# Patient Record
Sex: Male | Born: 1950 | Race: White | Hispanic: No | Smoking: Former smoker
Health system: Southern US, Community
[De-identification: ages and names within clinical notes are randomized; demographics above are authoritative.]

## PROBLEM LIST (undated history)

## (undated) DIAGNOSIS — I10 Essential (primary) hypertension: Secondary | ICD-10-CM

## (undated) DIAGNOSIS — C801 Malignant (primary) neoplasm, unspecified: Secondary | ICD-10-CM

## (undated) DIAGNOSIS — G8929 Other chronic pain: Secondary | ICD-10-CM

## (undated) DIAGNOSIS — J449 Chronic obstructive pulmonary disease, unspecified: Secondary | ICD-10-CM

## (undated) DIAGNOSIS — M1990 Unspecified osteoarthritis, unspecified site: Secondary | ICD-10-CM

## (undated) DIAGNOSIS — F419 Anxiety disorder, unspecified: Secondary | ICD-10-CM

## (undated) DIAGNOSIS — E119 Type 2 diabetes mellitus without complications: Secondary | ICD-10-CM

## (undated) DIAGNOSIS — M549 Dorsalgia, unspecified: Secondary | ICD-10-CM

## (undated) HISTORY — DX: Chronic obstructive pulmonary disease, unspecified: J44.9

## (undated) HISTORY — DX: Malignant (primary) neoplasm, unspecified: C80.1

## (undated) HISTORY — PX: NASAL SEPTUM SURGERY: SHX37

## (undated) HISTORY — PX: COLONOSCOPY W/ POLYPECTOMY: SHX1380

## (undated) HISTORY — DX: Type 2 diabetes mellitus without complications: E11.9

## (undated) HISTORY — PX: LEG SURGERY: SHX1003

## (undated) HISTORY — DX: Essential (primary) hypertension: I10

## (undated) HISTORY — DX: Anxiety disorder, unspecified: F41.9

---

## 2000-10-11 ENCOUNTER — Ambulatory Visit (HOSPITAL_BASED_OUTPATIENT_CLINIC_OR_DEPARTMENT_OTHER): Admission: RE | Admit: 2000-10-11 | Discharge: 2000-10-12 | Payer: Self-pay | Admitting: Orthopedic Surgery

## 2004-01-13 ENCOUNTER — Ambulatory Visit: Payer: Self-pay | Admitting: Internal Medicine

## 2004-03-14 ENCOUNTER — Ambulatory Visit: Payer: Self-pay | Admitting: Internal Medicine

## 2004-03-21 ENCOUNTER — Ambulatory Visit: Payer: Self-pay | Admitting: Internal Medicine

## 2004-03-22 ENCOUNTER — Ambulatory Visit: Payer: Self-pay | Admitting: Internal Medicine

## 2004-04-22 ENCOUNTER — Ambulatory Visit: Payer: Self-pay | Admitting: Internal Medicine

## 2004-06-24 ENCOUNTER — Ambulatory Visit: Payer: Self-pay | Admitting: Internal Medicine

## 2004-08-24 ENCOUNTER — Ambulatory Visit: Payer: Self-pay | Admitting: Internal Medicine

## 2004-10-24 ENCOUNTER — Ambulatory Visit: Payer: Self-pay | Admitting: Internal Medicine

## 2004-10-25 ENCOUNTER — Ambulatory Visit: Payer: Self-pay | Admitting: Internal Medicine

## 2004-12-12 ENCOUNTER — Ambulatory Visit: Payer: Self-pay | Admitting: Internal Medicine

## 2005-02-27 ENCOUNTER — Ambulatory Visit: Payer: Self-pay | Admitting: Internal Medicine

## 2005-04-17 ENCOUNTER — Ambulatory Visit: Payer: Self-pay | Admitting: Internal Medicine

## 2005-06-16 ENCOUNTER — Ambulatory Visit: Payer: Self-pay | Admitting: Internal Medicine

## 2005-08-08 ENCOUNTER — Ambulatory Visit: Payer: Self-pay | Admitting: Internal Medicine

## 2005-08-09 ENCOUNTER — Ambulatory Visit: Payer: Self-pay | Admitting: Internal Medicine

## 2005-08-11 ENCOUNTER — Ambulatory Visit: Payer: Self-pay | Admitting: Internal Medicine

## 2005-10-17 ENCOUNTER — Ambulatory Visit: Payer: Self-pay | Admitting: Internal Medicine

## 2005-11-30 ENCOUNTER — Ambulatory Visit: Payer: Self-pay | Admitting: Internal Medicine

## 2005-12-07 ENCOUNTER — Ambulatory Visit: Payer: Self-pay

## 2005-12-20 ENCOUNTER — Ambulatory Visit: Payer: Self-pay | Admitting: Internal Medicine

## 2005-12-26 ENCOUNTER — Ambulatory Visit: Payer: Self-pay | Admitting: Internal Medicine

## 2006-01-19 ENCOUNTER — Ambulatory Visit: Payer: Self-pay | Admitting: Internal Medicine

## 2006-03-23 ENCOUNTER — Ambulatory Visit: Payer: Self-pay | Admitting: Internal Medicine

## 2006-06-27 ENCOUNTER — Ambulatory Visit: Payer: Self-pay | Admitting: Internal Medicine

## 2006-09-20 ENCOUNTER — Encounter: Payer: Self-pay | Admitting: Internal Medicine

## 2006-09-20 DIAGNOSIS — Z860100 Personal history of colon polyps, unspecified: Secondary | ICD-10-CM | POA: Insufficient documentation

## 2006-09-20 DIAGNOSIS — Z860101 Personal history of adenomatous and serrated colon polyps: Secondary | ICD-10-CM | POA: Insufficient documentation

## 2006-09-20 DIAGNOSIS — J309 Allergic rhinitis, unspecified: Secondary | ICD-10-CM | POA: Insufficient documentation

## 2006-09-20 DIAGNOSIS — M25579 Pain in unspecified ankle and joints of unspecified foot: Secondary | ICD-10-CM

## 2006-09-20 DIAGNOSIS — F17200 Nicotine dependence, unspecified, uncomplicated: Secondary | ICD-10-CM | POA: Insufficient documentation

## 2006-09-20 DIAGNOSIS — F411 Generalized anxiety disorder: Secondary | ICD-10-CM

## 2006-09-20 DIAGNOSIS — J449 Chronic obstructive pulmonary disease, unspecified: Secondary | ICD-10-CM

## 2006-09-20 DIAGNOSIS — N410 Acute prostatitis: Secondary | ICD-10-CM

## 2006-12-26 ENCOUNTER — Ambulatory Visit: Payer: Self-pay | Admitting: Internal Medicine

## 2007-02-07 ENCOUNTER — Telehealth: Payer: Self-pay | Admitting: Internal Medicine

## 2007-03-11 ENCOUNTER — Ambulatory Visit: Payer: Self-pay | Admitting: Internal Medicine

## 2007-03-11 DIAGNOSIS — F524 Premature ejaculation: Secondary | ICD-10-CM

## 2007-03-11 DIAGNOSIS — N529 Male erectile dysfunction, unspecified: Secondary | ICD-10-CM

## 2007-03-18 ENCOUNTER — Telehealth: Payer: Self-pay | Admitting: Internal Medicine

## 2007-05-17 ENCOUNTER — Encounter: Payer: Self-pay | Admitting: Internal Medicine

## 2007-06-13 ENCOUNTER — Ambulatory Visit: Payer: Self-pay | Admitting: Internal Medicine

## 2007-09-13 ENCOUNTER — Ambulatory Visit: Payer: Self-pay | Admitting: Internal Medicine

## 2010-06-17 NOTE — Op Note (Signed)
[place]  Patient:    Joseph Preston, Joseph Preston Visit Number: [id number] MRN: [id number]          Service Type: Attending:  Loreta Ave, M.D. Dictated by:   Loreta Ave, M.D. Proc. Date: 10/11/00                             Operative Report  PREOPERATIVE DIAGNOSIS:  Bimalleolar ankle fracture, right, status post reduction dislocation.  POSTOPERATIVE DIAGNOSIS:  Bimalleolar ankle fracture, right, status post reduction dislocation.  OPERATIVE PROCEDURE:  Open reduction internal fixation, bimalleolar ankle fracture, right, utilizing DePuy titanium screws and plates.  SURGEON:  Loreta Ave, M.D.  ASSISTANT:  Arlys John D. Petrarca, P.A.-C.  ANESTHESIA:  General.  ESTIMATED BLOOD LOSS:  Minimal.  TOURNIQUET TIME:  One hour.  SPECIMENS:  None.  CULTURES:  None.  COMPLICATIONS:  None.  DRESSING:  Soft bulky dressing with splint.  DESCRIPTION OF PROCEDURE:  The patient was brought to the operating room and place on the operating table in the supine position.  After adequate anesthesia had been obtained, a tourniquet was applied to the upper aspect of the right leg.  The splint was removed.  Prepped and draped in the usual sterile fashion.  Exsanguinated with elevation and Esmarch tourniquet inflated to 350 mmHg.  A small incision over the medial malleolus. The skin and subcutaneous tissue divided.  A transverse fracture below the plafond.  The fracture site cleared.  Reduced anatomically. Had been placed with a clamp.  Two guidewires were passed across the fracture and found to be in acceptable position with fluoroscopy.  Predrilled and then affixed with two 40-mm partially threaded lag screws which were firmly placed yielding firm anatomic alignment and fixation.  The guidewires were removed.  Lateral malleolus approached with a straight incision over the back of the fibula. Skin and subcutaneous tissue divided.  Fracture exposed.   Debris and hematoma cleared.  Reduced anatomically.  A six-hole pre-bent titanium plate was placed on the posterolateral aspect of the fibula and fixed with three screws above and three below the fracture site with the middle most screw placed in a lag manner across the fracture.  Solid stable fixation and alignment at completion.  Care taken not to enter not to enter the ankle.  Full motion with good stability at completion visually and with fluoroscopy.  Wounds irrigated and closed with Vicryl and staples.  Margin wound injected with Marcaine. Sterile compressive dressing applied.  Splint applied.  Tourniquet and plate were removed.  Anesthesia reversed.  Brought to recovery room.  Tolerated surgery well, no complications. Dictated by:   Loreta Ave, M.D. Attending:  Loreta Ave, M.D. DD:  10/11/00 TD:  10/11/00 Job: [id number]

## 2010-06-17 NOTE — Assessment & Plan Note (Signed)
[facility]                           GASTROENTEROLOGY OFFICE NOTE   NAME:Joseph Preston, Joseph Preston                    MRN:          [id number]  DATE:12/20/2005                            DOB:          April 01, 1950    REFERRING PHYSICIAN:  Georgina Quint. Plotnikov, MD   REASON FOR CONSULTATION:  Dysphagia.   ASSESSMENT:  1. Dysphagia symptoms, intermittent, in a 60 year old man with reflux      problems on b.i.d. proton pump inhibitor.  Currently without dysphagia,      though had a spell one or two weeks ago that was fairly severe with      pressure in his esophagus after he swallowed.  2. History of colon polyps.  Surveillance colonoscopy due January 2008.  3. Chest pain with negative stress Myoview recently.   PLAN:  1. Schedule upper GI endoscopy, possible esophageal dilation.  2. He has asked for a cheaper PPI, so we will try omeprazole 20 mg b.i.d.      to see if he can get that and it works as well.  He may not need a      chronic b.i.d. PPI.  He has been on that for about four months because      of the flare of reflux symptoms over the summer.  3. Schedule surveillance colonoscopy.  It is a little early, but that is      reasonable.   NOTE:  He had H. pylori treated (positive serology) in the summer.   HISTORY:  The patient is a 60 year old man I know from previous colonoscopy  in 2004, showing diverticulosis, hemorrhoids and an adenomatous cecal polyp.  He has had spells of dysphagia, off and on, eating and feeling pressure in  his esophagus and chest area and taking liquids to make it clear.  This is  intermittent.  He had a bad spell a week or two ago.  This is better now.  He had increase in reflux symptoms in the summer with heartburn and went on  b.i.d. Protonix with some help.  He had a lot of fatigue and questionable  viral syndrome or something like that for the first part of 2007 and is  finally starting to feel better.  He has  complained of some dark stools, as  well.  I do not think he really had melena, but he might have.   PAST MEDICAL HISTORY:  1. Chronic low back pain.  2. Fatigue issues, resolving.  3. Allergic rhinitis.  4. Chronic obstructive pulmonary disease.  5. Smoker.  6. Prostatitis.  7. Colon polyps.  8. Irritable bowel syndrome.  9. Anxiety.  10.Leg fracture.  11.Sinus surgery.  12.Depression.  13.Panic disorder.  14.Osteoarthritis.   FAMILY HISTORY:  Father had prostate cancer.  Grandparents were alcoholics.  His brother had colon polyps, as well.   SOCIAL HISTORY:  Separated.  Three sons.  He is a Research scientist (physical sciences).  He is living with his mother, who is dying with colon cancer.  He still  smokes, occasional marijuana, occasional alcohol.   REVIEW OF SYSTEMS:  See medical history form for  full details.  This is  noted and reviewed there.   PHYSICAL EXAMINATION:  GENERAL:  Well-developed, middle-aged white man,  appearing somewhat older than stated age.  VITAL SIGNS:  Height 5 feet 8, weight 158 pounds.  HEENT:  Eyes anicteric.  Mouth:  Posterior pharynx free of lesions.  NECK:  The neck is supple, without thyromegaly or masses.  CHEST:  The chest is clear.  HEART:  S1, S2, no murmurs or gallops.  ABDOMEN:  Soft, nontender, no organomegaly or masses.  RECTAL EXAM:  Deferred.  LYMPHATICS:  No neck or supraclavicular nodes.  EXTREMITIES:  No peripheral edema in lower extremities.  SKIN:  Sun damaged.  NEUROLOGIC/PSYCHIATRIC:  He is alert and oriented times three.   I appreciate the opportunity to care for this patient.     Iva Boop, MD,FACG  Electronically Signed    CEG/MedQ  DD: 12/20/2005  DT: 12/20/2005  Job #: [id number]   cc:   Georgina Quint. Plotnikov, MD

## 2011-02-01 ENCOUNTER — Encounter: Payer: Self-pay | Admitting: Internal Medicine

## 2011-10-25 ENCOUNTER — Encounter: Payer: Self-pay | Admitting: Internal Medicine

## 2011-11-21 ENCOUNTER — Encounter (HOSPITAL_COMMUNITY): Payer: Self-pay

## 2011-11-21 ENCOUNTER — Emergency Department (HOSPITAL_COMMUNITY)
Admission: EM | Admit: 2011-11-21 | Discharge: 2011-11-21 | Disposition: A | Discharge status: Home / Self Care | Payer: Self-pay | Attending: Emergency Medicine | Admitting: Emergency Medicine

## 2011-11-21 DIAGNOSIS — Y929 Unspecified place or not applicable: Secondary | ICD-10-CM | POA: Insufficient documentation

## 2011-11-21 DIAGNOSIS — F17200 Nicotine dependence, unspecified, uncomplicated: Secondary | ICD-10-CM | POA: Insufficient documentation

## 2011-11-21 DIAGNOSIS — T1590XA Foreign body on external eye, part unspecified, unspecified eye, initial encounter: Secondary | ICD-10-CM | POA: Insufficient documentation

## 2011-11-21 DIAGNOSIS — Y9389 Activity, other specified: Secondary | ICD-10-CM | POA: Insufficient documentation

## 2011-11-21 DIAGNOSIS — S0502XA Injury of conjunctiva and corneal abrasion without foreign body, left eye, initial encounter: Secondary | ICD-10-CM

## 2011-11-21 DIAGNOSIS — Z79899 Other long term (current) drug therapy: Secondary | ICD-10-CM | POA: Insufficient documentation

## 2011-11-21 DIAGNOSIS — S058X9A Other injuries of unspecified eye and orbit, initial encounter: Secondary | ICD-10-CM | POA: Insufficient documentation

## 2011-11-21 MED ORDER — TETRACAINE HCL 0.5 % OP SOLN
2.0000 [drp] | Freq: Once | OPHTHALMIC | Status: AC
  Administered 2011-11-21: 2 [drp] via OPHTHALMIC
  Filled 2011-11-21: qty 2

## 2011-11-21 MED ORDER — KETOROLAC TROMETHAMINE 0.5 % OP SOLN
1.0000 [drp] | Freq: Once | OPHTHALMIC | Status: AC
  Administered 2011-11-21: 1 [drp] via OPHTHALMIC
  Filled 2011-11-21: qty 5

## 2011-11-21 MED ORDER — TOBRAMYCIN 0.3 % OP SOLN
2.0000 [drp] | Freq: Once | OPHTHALMIC | Status: AC
  Administered 2011-11-21: 2 [drp] via OPHTHALMIC
  Filled 2011-11-21: qty 5

## 2011-11-21 MED ORDER — HYDROCODONE-ACETAMINOPHEN 5-325 MG PO TABS
1.0000 | ORAL_TABLET | Freq: Once | ORAL | Status: AC
  Administered 2011-11-21: 1 via ORAL
  Filled 2011-11-21: qty 1

## 2011-11-21 MED ORDER — HYDROCODONE-ACETAMINOPHEN 5-325 MG PO TABS: ORAL_TABLET | ORAL | Status: DC

## 2011-11-21 NOTE — ED Provider Notes (Signed)
History     CSN: [id number]  Arrival date & time 11/21/11  1705   First MD Initiated Contact with Patient 11/21/11 1737      Chief Complaint  Patient presents with  . Foreign Body in Eye    (Consider location/radiation/quality/duration/timing/severity/associated sxs/prior treatment) HPI Comments: Patient c/o pain to the left eye that occurred several hours PTA when he was sawing wood and accidentally got saw dust in his left eye.  States that he has rinsed his eye with tap water and used Visine drops w/o relief.  He also report excessive tearing and sensitivity to bright lights.  He denies vomiting, headache, neck pain or decreased vision.    Patient is a 61 y.o. male presenting with eye pain. The history is provided by the patient.  Eye Pain This is a new problem. Episode onset: several hrs prior to ED arrival. The problem occurs constantly. The problem has been unchanged. Associated symptoms include a visual change. Pertinent negatives include no chest pain, fever, headaches, nausea, neck pain, numbness, rash, sore throat, swollen glands, vertigo, vomiting or weakness. Exacerbated by: bright lights and blinking. Treatments tried: rinsing. The treatment provided no relief.    History reviewed. No pertinent past medical history.  Past Surgical History  Procedure Date  . Colonoscopy w/ polypectomy     No family history on file.  History  Substance Use Topics  . Smoking status: Current Every Day Smoker  . Smokeless tobacco: Not on file  . Alcohol Use: No      Review of Systems  Constitutional: Negative for fever, activity change and appetite change.  HENT: Negative for sore throat, facial swelling and neck pain.   Eyes: Positive for photophobia, pain and redness. Negative for discharge and itching.  Respiratory: Negative for chest tightness and shortness of breath.   Cardiovascular: Negative for chest pain.  Gastrointestinal: Negative for nausea and vomiting.  Skin:  Negative for rash.  Neurological: Negative for dizziness, vertigo, syncope, facial asymmetry, weakness, numbness and headaches.  Psychiatric/Behavioral: Negative for confusion.  All other systems reviewed and are negative.    Allergies  Penicillins; Sulfonamide derivatives; and Varenicline tartrate  Home Medications   Current Outpatient Rx  Name Route Sig Dispense Refill  . NAPROXEN SODIUM 220 MG PO TABS Oral Take 220 mg by mouth daily as needed. For arthritis pain    . POLYVINYL ALCOHOL-POVIDONE 5-6 MG/ML OP SOLN Ophthalmic Apply 1 drop to eye as needed. To flush eye of debri      BP 149/85  Pulse 74  Temp 98.4 F (36.9 C) (Oral)  Resp 20  Ht 5\' 7"  (1.702 m)  Wt 150 lb (68.04 kg)  BMI 23.49 kg/m2  SpO2 100%  Physical Exam  Nursing note and vitals reviewed. Constitutional: He is oriented to person, place, and time. He appears well-developed and well-nourished. No distress.  HENT:  Head: Normocephalic and atraumatic.  Mouth/Throat: Oropharynx is clear and moist.  Eyes: Pupils are equal, round, and reactive to light. No foreign bodies found. Left eye exhibits no chemosis, no discharge, no exudate and no hordeolum. No foreign body present in the left eye. Left conjunctiva is injected. Left eye exhibits normal extraocular motion and no nystagmus.  Slit lamp exam:      The left eye shows corneal abrasion. The left eye shows no corneal flare, no corneal ulcer, no foreign body, no hyphema, no hypopyon and no fluorescein uptake.    Neck: Normal range of motion. Neck supple.  Cardiovascular: Normal rate,  regular rhythm, normal heart sounds and intact distal pulses.   No murmur heard. Pulmonary/Chest: Effort normal and breath sounds normal.  Musculoskeletal: Normal range of motion. He exhibits no edema and no tenderness.  Lymphadenopathy:    He has no cervical adenopathy.  Neurological: He is alert and oriented to person, place, and time. He exhibits normal muscle tone.  Coordination normal.  Skin: Skin is warm and dry.    ED Course  Procedures (including critical care time)  Labs Reviewed - No data to display No results found.   Visual acuity reviewed by me.     MDM    Patient is feeling better,  Large corneal abrasion to the left eye.  No FB's seen. Left eye was also irrigated with nml saline.  Pt agrees to close f/u with ophthalmologist in 1-2 days for recheck.    Tobramycin and ketorolac opth drops dispensed from the ED.  Prescribed: norco #15     Lynford Espinoza L. Joandy Burget, [place] 11/23/11 0131

## 2011-11-21 NOTE — ED Notes (Signed)
Left eye irrigated with saline

## 2011-11-21 NOTE — ED Notes (Signed)
Left eye red and painful

## 2011-11-21 NOTE — ED Notes (Signed)
Pt reports got saw dust in left eye.  Pt says has flushed eye several times but no relief.

## 2011-11-24 NOTE — ED Provider Notes (Signed)
Medical screening examination/treatment/procedure(s) were performed by non-physician practitioner and as supervising physician I was immediately available for consultation/collaboration.   Azreal Stthomas L Corin Tilly, MD 11/24/11 1447 

## 2013-01-07 ENCOUNTER — Emergency Department (HOSPITAL_COMMUNITY)
Admission: EM | Admit: 2013-01-07 | Discharge: 2013-01-07 | Disposition: A | Discharge status: Home / Self Care | Payer: 59 | Attending: Emergency Medicine | Admitting: Emergency Medicine

## 2013-01-07 ENCOUNTER — Encounter (HOSPITAL_COMMUNITY): Payer: Self-pay | Admitting: Emergency Medicine

## 2013-01-07 DIAGNOSIS — Z87891 Personal history of nicotine dependence: Secondary | ICD-10-CM | POA: Insufficient documentation

## 2013-01-07 DIAGNOSIS — M129 Arthropathy, unspecified: Secondary | ICD-10-CM | POA: Insufficient documentation

## 2013-01-07 DIAGNOSIS — Z791 Long term (current) use of non-steroidal anti-inflammatories (NSAID): Secondary | ICD-10-CM | POA: Insufficient documentation

## 2013-01-07 DIAGNOSIS — M5430 Sciatica, unspecified side: Secondary | ICD-10-CM | POA: Insufficient documentation

## 2013-01-07 DIAGNOSIS — M5432 Sciatica, left side: Secondary | ICD-10-CM

## 2013-01-07 DIAGNOSIS — G8929 Other chronic pain: Secondary | ICD-10-CM | POA: Insufficient documentation

## 2013-01-07 DIAGNOSIS — Z880 Allergy status to penicillin: Secondary | ICD-10-CM | POA: Insufficient documentation

## 2013-01-07 HISTORY — DX: Dorsalgia, unspecified: M54.9

## 2013-01-07 HISTORY — DX: Unspecified osteoarthritis, unspecified site: M19.90

## 2013-01-07 HISTORY — DX: Other chronic pain: G89.29

## 2013-01-07 MED ORDER — DICLOFENAC SODIUM 75 MG PO TBEC: 75.0000 mg | DELAYED_RELEASE_TABLET | Freq: Two times a day (BID) | ORAL | Status: DC

## 2013-01-07 MED ORDER — METHOCARBAMOL 500 MG PO TABS: 500.0000 mg | ORAL_TABLET | Freq: Three times a day (TID) | ORAL | Status: DC

## 2013-01-07 MED ORDER — KETOROLAC TROMETHAMINE 60 MG/2ML IM SOLN
60.0000 mg | Freq: Once | INTRAMUSCULAR | Status: AC
  Administered 2013-01-07: 60 mg via INTRAMUSCULAR
  Filled 2013-01-07: qty 2

## 2013-01-07 MED ORDER — DEXAMETHASONE 6 MG PO TABS: ORAL_TABLET | ORAL | Status: DC

## 2013-01-07 MED ORDER — DEXAMETHASONE SODIUM PHOSPHATE 4 MG/ML IJ SOLN
8.0000 mg | Freq: Once | INTRAMUSCULAR | Status: AC
  Administered 2013-01-07: 8 mg via INTRAMUSCULAR
  Filled 2013-01-07: qty 2

## 2013-01-07 NOTE — ED Provider Notes (Signed)
History/physical exam/procedure(s) were performed by non-physician practitioner and as supervising physician I was immediately available for consultation/collaboration. I have reviewed all notes and am in agreement with care and plan.   Elnathan Fulford S Jaysiah Marchetta, MD 01/07/13 1643 

## 2013-01-07 NOTE — ED Notes (Signed)
Pt c/o lower back pain that radiates down left leg. States that he has had problems with his back and this pain for "years" but pain became worse over the past week, denies any injury.

## 2013-01-07 NOTE — ED Provider Notes (Signed)
CSN: [id number]     Arrival date & time 01/07/13  4098 History   First MD Initiated Contact with Patient 01/07/13 1021     Chief Complaint  Patient presents with  . Back Pain   (Consider location/radiation/quality/duration/timing/severity/associated sxs/prior Treatment) Patient is a 62 y.o. male presenting with back pain. The history is provided by the patient.  Back Pain Location:  Lumbar spine Quality:  Aching Radiates to:  L thigh Pain severity:  Severe Pain is:  Same all the time Onset quality:  Gradual Duration:  13 months Timing:  Intermittent Progression:  Worsening Chronicity: acute on chronic. Context comment:  Hx of sciatica, pain worse with standing and walking Relieved by:  NSAIDs Worsened by:  Movement and standing Associated symptoms: no abdominal pain, no bladder incontinence, no bowel incontinence, no chest pain, no dysuria and no perianal numbness     Past Medical History  Diagnosis Date  . Back pain, chronic   . Arthritis    Past Surgical History  Procedure Laterality Date  . Colonoscopy w/ polypectomy    . Leg surgery     No family history on file. History  Substance Use Topics  . Smoking status: Former Games developer  . Smokeless tobacco: Not on file  . Alcohol Use: No    Review of Systems  Constitutional: Negative for activity change.       All ROS Neg except as noted in HPI  HENT: Negative for nosebleeds.   Eyes: Negative for photophobia and discharge.  Respiratory: Negative for cough, shortness of breath and wheezing.   Cardiovascular: Negative for chest pain and palpitations.  Gastrointestinal: Negative for abdominal pain, blood in stool and bowel incontinence.  Genitourinary: Negative for bladder incontinence, dysuria, frequency and hematuria.  Musculoskeletal: Positive for back pain. Negative for arthralgias and neck pain.  Skin: Negative.   Neurological: Negative for dizziness, seizures and speech difficulty.  Psychiatric/Behavioral: Negative  for hallucinations and confusion.    Allergies  Penicillins; Sulfonamide derivatives; and Varenicline tartrate  Home Medications   Current Outpatient Rx  Name  Route  Sig  Dispense  Refill  . naproxen sodium (ALEVE) 220 MG tablet   Oral   Take 220 mg by mouth daily.           BP 136/80  Pulse 72  Temp(Src) 97.4 F (36.3 C) (Oral)  Resp 18  Ht 5\' 7"  (1.702 m)  Wt 158 lb (71.668 kg)  BMI 24.74 kg/m2  SpO2 100% Physical Exam  Nursing note and vitals reviewed. Constitutional: He is oriented to person, place, and time. He appears well-developed and well-nourished.  Non-toxic appearance.  HENT:  Head: Normocephalic.  Right Ear: Tympanic membrane and external ear normal.  Left Ear: Tympanic membrane and external ear normal.  Eyes: EOM and lids are normal. Pupils are equal, round, and reactive to light.  Neck: Normal range of motion. Neck supple. Carotid bruit is not present.  Cardiovascular: Normal rate, regular rhythm, normal heart sounds, intact distal pulses and normal pulses.   Pulmonary/Chest: Breath sounds normal. No respiratory distress.  Abdominal: Soft. Bowel sounds are normal. There is no tenderness. There is no guarding.  Musculoskeletal:       Lumbar back: He exhibits decreased range of motion, tenderness, pain and spasm.  Lymphadenopathy:       Head (right side): No submandibular adenopathy present.       Head (left side): No submandibular adenopathy present.    He has no cervical adenopathy.  Neurological: He is alert  and oriented to person, place, and time. He has normal strength. No cranial nerve deficit or sensory deficit.  Skin: Skin is warm and dry.  Psychiatric: He has a normal mood and affect. His speech is normal.    ED Course  Procedures (including critical care time) Labs Review Labs Reviewed - No data to display Imaging Review No results found.  EKG Interpretation   None       MDM  No diagnosis found. **I have reviewed nursing notes,  vital signs, and all appropriate lab and imaging results for this patient.*  Pt has hx of sciatica, but has not seen a physician in 8 years. He is in ED today because the pain is bringing him to tears at times.  No gross neuro deficit noted.  Rx for robaxin, decadron, and diclofenac given to the patient. Pt to follow up with PCP for evaluation and possible MRI.  Kathie Dike, PA-C 01/07/13 1104

## 2013-04-17 ENCOUNTER — Encounter: Payer: Self-pay | Admitting: Family Medicine

## 2013-04-17 ENCOUNTER — Telehealth: Payer: Self-pay | Admitting: Family Medicine

## 2013-04-17 ENCOUNTER — Ambulatory Visit (INDEPENDENT_AMBULATORY_CARE_PROVIDER_SITE_OTHER)
Admission: RE | Admit: 2013-04-17 | Discharge: 2013-04-17 | Disposition: A | Discharge status: Home / Self Care | Payer: 59 | Source: Ambulatory Visit | Attending: Family Medicine | Admitting: Family Medicine

## 2013-04-17 ENCOUNTER — Ambulatory Visit (INDEPENDENT_AMBULATORY_CARE_PROVIDER_SITE_OTHER): Payer: 59 | Admitting: Family Medicine

## 2013-04-17 VITALS — BP 140/80 | HR 62 | Wt 172.0 lb

## 2013-04-17 DIAGNOSIS — M545 Low back pain, unspecified: Secondary | ICD-10-CM

## 2013-04-17 DIAGNOSIS — G5702 Lesion of sciatic nerve, left lower limb: Secondary | ICD-10-CM

## 2013-04-17 DIAGNOSIS — G5700 Lesion of sciatic nerve, unspecified lower limb: Secondary | ICD-10-CM

## 2013-04-17 MED ORDER — GABAPENTIN 300 MG PO CAPS: ORAL_CAPSULE | ORAL | Status: DC

## 2013-04-17 MED ORDER — PREDNISONE 50 MG PO TABS: 50.0000 mg | ORAL_TABLET | Freq: Every day | ORAL | Status: DC

## 2013-04-17 NOTE — Assessment & Plan Note (Signed)
Patient's low back pain is likely secondary to osteoarthritis. We will get x-rays to further evaluate. Differential also includes possibly piriformis syndrome I think that is giving him his symptoms. Patient's though if he does not make any improvement with conservative therapy we'll be fairly quickly to get an MRI. Patient does not have any significant neurological weakness stated today on physical exam but per patient's symptoms I am concerned. Patient and will come back again in 1-2 weeks. Patient in the interim we'll try prednisone as well as gabapentin at night to see if we can make any benefit. We will also treat tearfulness and patient's leg length discrepancy.

## 2013-04-17 NOTE — Assessment & Plan Note (Signed)
Piriformis Syndrome  Using an anatomical model, reviewed with the patient the structures involved and how they related to diagnosis. The patient indicated understanding.   The patient was given a handout from Dr. Arne Cleveland book "The Sports Medicine Patient Advisor" describing the anatomy and rehabilitation of the following condition: Piriformis Syndrome  Also given a handout with more extensive Piriformis stretching, hip flexor and abductor strengthening, ham stretching  Rec deep massage, explained self-massage with ball  Prednisone, gabapentin as well as the discussed tennis ball to back pocket Patient will come back again in one 2 weeks for further evaluation. We may need to consider MRI of the low back to rule out nerve impingement at this continues.

## 2013-04-17 NOTE — Progress Notes (Signed)
  [name] [address], [place] [address] Phone: [phone] Subjective:     CC: back and leg pain.   [id number] Joseph Preston is a 63 y.o. male coming in with complaint of back and leg pain. Patient states it usually goes down the left leg. Patient was seen in the emergency department greater than 3 months ago for similar symptoms. Patient states that this pain now seems to be mostly chronic going down the posterior aspect of his leg only to the dorsal aspect of his foot. Patient states that he is having some associated weakness of that as well. 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 Patient describes a chronic dull aching sensation in his back is most severe in his left buttocks. Patient states that this pain wakes him up at night and states that the severity is 9/10. Patient was given some corticosteroids by emergency department 3 months ago that did help relieve some of his symptoms. Patient has been trying to take anti-inflammatories with no significant improvement. Patient is also try a muscle relaxer that just barely takes the edge off. No exercises have been given to this individual. No imaging has been taken for greater than 6 years.     Past medical history, social, surgical and family history all reviewed in electronic medical record.   Review of Systems: No headache, visual changes, nausea, vomiting, diarrhea, constipation, dizziness, abdominal pain, skin rash, fevers, chills, night sweats, weight loss, swollen lymph nodes, body aches, joint swelling, muscle aches, chest pain, shortness of breath, mood changes.   Objective Blood pressure 140/80, pulse 62, weight 172 lb (78.019 kg), SpO2 98.00%.  General: No apparent distress alert and oriented x3 mood and affect normal, dressed appropriately.  HEENT: Pupils equal, extraocular movements  intact  Respiratory: Patient's speak in full sentences and does not appear short of breath  Cardiovascular: No lower extremity edema, non tender, no erythema  Skin: Warm dry intact with no signs of infection or rash on extremities or on axial skeleton.  Abdomen: Soft nontender  Neuro: Cranial nerves II through XII are intact, neurovascularly intact in all extremities with 2+ DTRs and 2+ pulses.  Lymph: No lymphadenopathy of posterior or anterior cervical chain or axillae bilaterally.   MSK:  Non tender with full range of motion and good stability and symmetric strength and tone of shoulders, elbows, wrist, hip, knee and ankles bilaterally.  Back Exam:  Inspection: Unremarkable poor course strength Motion: Flexion 45 deg, Extension 35 deg, Side Bending to 45 deg bilaterally,  Rotation to 45 deg bilaterally  SLR laying: Negative  XSLR laying: Negative  Palpable tenderness: Mild tenderness of the paraspinal musculature of the lumbar spine with severe tenderness in the piriformis muscle. FABER: Positive on left. Sensory change: Gross sensation intact to all lumbar and sacral dermatomes.  Reflexes: 2+ at both patellar tendons, 2+ at achilles tendons, Babinski's downgoing.  Strength at foot  Plantar-flexion: 5/5 Dorsi-flexion: 5/5 Eversion: 5/5 Inversion: 5/5  Leg strength  Quad: 5/5 Hamstring: 5/5 Hip flexor: 5/5 Hip abductors: 5/5  Gait antalgic Leg length discrepancy with patient's right leg being significantly shorter than left   Impression and Recommendations:     This case required medical decision making of moderate complexity.

## 2013-04-17 NOTE — Telephone Encounter (Signed)
Pt called to say he is in a lot of pain.  He is requesting pain medicine.  Pharmacy is [facility] in [place].

## 2013-04-17 NOTE — Patient Instructions (Addendum)
Very good to see you Try exercises most days of the week Prednisone daily for 5 days Vitamin D 2000 IU daily Turmeric 500mg  Twice daily.  Gabapentin at night.  Tennis ball back left pocket at all times.  Wear heel lift in RIGHT shoe.  Xrays downstairs today.  Come back or call me in 1 week, if still in pain we will get MRI

## 2013-04-18 NOTE — Telephone Encounter (Signed)
Pt is aware.  

## 2013-04-18 NOTE — Telephone Encounter (Signed)
LM to return call.

## 2013-04-18 NOTE — Telephone Encounter (Signed)
He needs to try the medications that I sent him.  We can discuss at follow up.

## 2013-05-09 ENCOUNTER — Ambulatory Visit (INDEPENDENT_AMBULATORY_CARE_PROVIDER_SITE_OTHER): Payer: 59 | Admitting: Family Medicine

## 2013-05-09 ENCOUNTER — Ambulatory Visit: Payer: 59 | Admitting: Physician Assistant

## 2013-05-09 ENCOUNTER — Encounter: Payer: Self-pay | Admitting: Family Medicine

## 2013-05-09 VITALS — BP 126/80 | HR 77 | Wt 167.0 lb

## 2013-05-09 DIAGNOSIS — M5416 Radiculopathy, lumbar region: Secondary | ICD-10-CM

## 2013-05-09 DIAGNOSIS — IMO0002 Reserved for concepts with insufficient information to code with codable children: Secondary | ICD-10-CM

## 2013-05-09 DIAGNOSIS — M545 Low back pain, unspecified: Secondary | ICD-10-CM

## 2013-05-09 MED ORDER — GABAPENTIN 100 MG PO CAPS: 100.0000 mg | ORAL_CAPSULE | Freq: Two times a day (BID) | ORAL | Status: DC

## 2013-05-09 MED ORDER — TRAMADOL HCL 50 MG PO TABS: 50.0000 mg | ORAL_TABLET | Freq: Three times a day (TID) | ORAL | Status: DC | PRN

## 2013-05-09 NOTE — Assessment & Plan Note (Signed)
Patient not responding well to conservative therapy and patient's x-rays maybe corresponding to an L5-S1 nerve root compression I do feel further imaging is necessary. MRI is ordered. Discuss in the interim patient increasing gabapentin 200 mg twice during the day and giving tramadol for breakthrough pain. Continued gabapentin at night. Patient will followup one to 2 days after the MRI and we'll go after the results.

## 2013-05-09 NOTE — Progress Notes (Signed)
  [name], [name] [address] Phone: [phone] Subjective:     CC: back and leg pain follow up  [id number] Joseph Preston is a 63 y.o. male coming in with complaint of back and leg pain. Patient's did have x-rays and was found to have L4-L5 as well as L5-S1 degenerative disc disease. Patient also has some mild grade 1 anterior listhesis. Patient was given prednisone as well as started on gabapentin and night for the radicular symptoms. Patient given home exercise program and discussed over-the-counter medications that could be beneficial. Patient was to return one week afterwards for further evaluation and this was one month ago. Patient states really has not any significantly better. Patient was given prednisone and states that for does 5 days he did have significant improvement with decreasing radicular symptoms down the left leg. Patient states that after taking the medicine that seemed to return. Patient states that the gabapentin does help him sleep at night which is helpful. Patient denies any new symptoms and denies any weakness. Patient states he can have good days and bad days.     Past medical history, social, surgical and family history all reviewed in electronic medical record.   Review of Systems: No headache, visual changes, nausea, vomiting, diarrhea, constipation, dizziness, abdominal pain, skin rash, fevers, chills, night sweats, weight loss, swollen lymph nodes, body aches, joint swelling, muscle aches, chest pain, shortness of breath, mood changes.   Objective Blood pressure 126/80, pulse 77, weight 167 lb (75.751 kg), SpO2 96.00%.  General: No apparent distress alert and oriented x3 mood and affect normal, dressed appropriately.  HEENT: Pupils equal, extraocular movements intact  Respiratory: Patient's speak in full sentences and does not appear short of breath  Cardiovascular: No lower extremity edema, non  tender, no erythema  Skin: Warm dry intact with no signs of infection or rash on extremities or on axial skeleton.  Abdomen: Soft nontender  Neuro: Cranial nerves II through XII are intact, neurovascularly intact in all extremities with 2+ DTRs and 2+ pulses.  Lymph: No lymphadenopathy of posterior or anterior cervical chain or axillae bilaterally.   MSK:  Non tender with full range of motion and good stability and symmetric strength and tone of shoulders, elbows, wrist, hip, knee and ankles bilaterally.  Back Exam:  Inspection: Unremarkable poor course strength Motion: Flexion 45 deg, Extension 35 deg, Side Bending to 45 deg bilaterally,  Rotation to 45 deg bilaterally  SLR laying: Positive left XSLR laying: Negative  Palpable tenderness: Mild tenderness of the paraspinal musculature of the lumbar spine with severe tenderness in the piriformis muscle. FABER: Positive on left. Sensory change: Gross sensation intact to all lumbar and sacral dermatomes.  Reflexes: 2+ at both patellar tendons, 2+ at achilles tendons, Babinski's downgoing.  Strength at foot  Plantar-flexion: 5/5 Dorsi-flexion: 5/5 Eversion: 5/5 Inversion: 5/5  Leg strength  Quad: 5/5 Hamstring: 5/5 Hip flexor: 5/5 Hip abductors: 4/5  Gait antalgic Leg length discrepancy with patient's right leg being significantly shorter than left   Impression and Recommendations:     This case required medical decision making of moderate complexity.

## 2013-05-09 NOTE — Patient Instructions (Signed)
Good to see you Continue exercises 3 times weekly Gabapentin 100mg  in AM and at lunch. Continue the 300mg  at night Tramadol 50mg  twice daily as needed.  After MRI make an appointment with me and we will go over it together.

## 2013-05-17 ENCOUNTER — Inpatient Hospital Stay: Admission: RE | Admit: 2013-05-17 | Payer: 59 | Source: Ambulatory Visit

## 2013-05-19 ENCOUNTER — Telehealth: Payer: Self-pay | Admitting: Family Medicine

## 2013-05-19 NOTE — Telephone Encounter (Signed)
Insurance denied pt MRI L spine wo contrast

## 2013-05-20 NOTE — Telephone Encounter (Signed)
Did they say why?  It is clinically indictated and physical exam

## 2013-05-21 ENCOUNTER — Ambulatory Visit: Payer: 59 | Admitting: Internal Medicine

## 2013-05-21 DIAGNOSIS — Z0289 Encounter for other administrative examinations: Secondary | ICD-10-CM

## 2013-05-21 MED ORDER — CYCLOBENZAPRINE HCL 10 MG PO TABS: 10.0000 mg | ORAL_TABLET | Freq: Three times a day (TID) | ORAL | Status: DC | PRN

## 2013-05-21 NOTE — Telephone Encounter (Signed)
Discussed with pt's wife. 

## 2013-05-21 NOTE — Telephone Encounter (Signed)
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

## 2013-05-21 NOTE — Telephone Encounter (Signed)
Send in medication, please clal patient

## 2013-05-24 ENCOUNTER — Other Ambulatory Visit: Payer: 59

## 2013-05-29 ENCOUNTER — Telehealth: Payer: Self-pay | Admitting: Family Medicine

## 2013-05-29 NOTE — Telephone Encounter (Signed)
Patient's wife called and states that the patient's Tramadol and muscle relaxer is not working for him. She wants to know what he should do now. Their insurance company will not authorize his MRI.

## 2013-05-30 ENCOUNTER — Ambulatory Visit (INDEPENDENT_AMBULATORY_CARE_PROVIDER_SITE_OTHER): Payer: 59 | Admitting: Family Medicine

## 2013-05-30 ENCOUNTER — Encounter: Payer: Self-pay | Admitting: Family Medicine

## 2013-05-30 DIAGNOSIS — M545 Low back pain, unspecified: Secondary | ICD-10-CM

## 2013-05-30 DIAGNOSIS — G5700 Lesion of sciatic nerve, unspecified lower limb: Secondary | ICD-10-CM

## 2013-05-30 DIAGNOSIS — M79609 Pain in unspecified limb: Secondary | ICD-10-CM

## 2013-05-30 DIAGNOSIS — IMO0002 Reserved for concepts with insufficient information to code with codable children: Secondary | ICD-10-CM

## 2013-05-30 DIAGNOSIS — M5416 Radiculopathy, lumbar region: Secondary | ICD-10-CM

## 2013-05-30 DIAGNOSIS — G5702 Lesion of sciatic nerve, left lower limb: Secondary | ICD-10-CM

## 2013-05-30 MED ORDER — METHYLPREDNISOLONE ACETATE 80 MG/ML IJ SUSP
80.0000 mg | Freq: Once | INTRAMUSCULAR | Status: AC
  Administered 2013-05-30: 80 mg via INTRAMUSCULAR

## 2013-05-30 MED ORDER — KETOROLAC TROMETHAMINE 60 MG/2ML IM SOLN
60.0000 mg | Freq: Once | INTRAMUSCULAR | Status: AC
  Administered 2013-05-30: 60 mg via INTRAMUSCULAR

## 2013-05-30 NOTE — Assessment & Plan Note (Signed)
Patient is actually getting worse. Patient does have significant weakness as well as showing central nerve irritation with increasing deep tendon reflex. It is critical that patient has advanced imaging with the MRI. We will put another order in for an MRI secondary to worsening lumbar radiculopathy as well as weakness of the muscle strength. Do anything necessary to get this MRI for the patient. Patient in the interim we'll try to go up on his gabapentin. Patient cannot be out of work due to financial constraints. Encourage him to avoid any heavy lifting in the interim. After patient has MRI we'll discuss with him the possibility of an epidural injection. Patient will continue exercises.  Spent greater than 25 minutes with patient face-to-face and had greater than 50% of counseling including as described above in assessment and plan.

## 2013-05-30 NOTE — Patient Instructions (Addendum)
Good to see you Increase gabapentin to 200mg , 200mg , 300mg  Two injections today Call Tuesday if you have not heard from me.  Once mri is done, I will cal you with results and likely schedule epidural injection.  Then after injection come see me again 2 weeks after.

## 2013-05-30 NOTE — Progress Notes (Signed)
  [name] [address] [place], [place] [address] Phone: [phone] Subjective:     CC: back and leg pain follow up  Joseph Preston is a 63 y.o. male coming in with complaint of back and leg pain. Patient's did have x-rays and was found to have L4-L5 as well as L5-S1 degenerative disc disease. Patient also has some mild grade 1 anterior listhesis. Patient was given prednisone as well as started on gabapentin and night for the radicular symptoms. Patient has been doing home exercises and unfortunately cannot do formal physical therapy secondary to financial restraints. Patient unfortunately is actually having worsening pain as well as weakness on the left leg. Patient states that the burning sensation down seems to be chronic and does not seem to improve. Patient states that he has had a lot of work on multiple occasions secondary to the amount of weakness he is having her leg. Patient is having such intense pain that he cannot find any comfortable area at night. Patient was ordered to have an MRI which was declined by his insurance company. Patient waited until now to be seen again became there is no Tinel's that could be done. This is affecting all activities of daily living including sleep. Patient denies any bowel or bladder incontinence     Past medical history, social, surgical and family history all reviewed in electronic medical record.   Review of Systems: No headache, visual changes, nausea, vomiting, diarrhea, constipation, dizziness, abdominal pain, skin rash, fevers, chills, night sweats, weight loss, swollen lymph nodes, body aches, joint swelling, muscle aches, chest pain, shortness of breath, mood changes.   Objective   General: No apparent distress alert and oriented x3 mood and affect normal, dressed appropriately.  HEENT: Pupils equal, extraocular movements intact  Respiratory: Patient's speak in full sentences and does not  appear short of breath  Cardiovascular: No lower extremity edema, non tender, no erythema  Skin: Warm dry intact with no signs of infection or rash on extremities or on axial skeleton.  Abdomen: Soft nontender  Neuro: Cranial nerves II through XII are intact, neurovascularly intact in all extremities with 2+ DTRs and 2+ pulses.  Lymph: No lymphadenopathy of posterior or anterior cervical chain or axillae bilaterally.   MSK:  Non tender with full range of motion and good stability and symmetric strength and tone of shoulders, elbows, wrist, hip, knee and ankles bilaterally.  Back Exam:  Inspection: Unremarkable poor course strength Motion: Flexion 35 deg, Extension 35 deg, Side Bending to 45 deg bilaterally,  Rotation to 35 deg bilaterally  SLR laying: Positive left XSLR laying: Negative  Palpable tenderness: Mild tenderness of the paraspinal musculature of the lumbar spine with severe tenderness in the piriformis muscle. FABER: Positive on left. Sensory change: Gross sensation intact to all lumbar and sacral dermatomes.  Reflexes: 3+ at both patellar tendons on the side compared to 2+ on right side, 3+ at achilles tendons on left side compared to 2+ on right side, Babinski's downgoing. 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

## 2013-05-30 NOTE — Telephone Encounter (Signed)
Discussed with pt at office visit.

## 2013-06-02 ENCOUNTER — Ambulatory Visit (INDEPENDENT_AMBULATORY_CARE_PROVIDER_SITE_OTHER): Payer: 59 | Admitting: Internal Medicine

## 2013-06-02 ENCOUNTER — Other Ambulatory Visit (INDEPENDENT_AMBULATORY_CARE_PROVIDER_SITE_OTHER): Payer: 59

## 2013-06-02 ENCOUNTER — Encounter: Payer: Self-pay | Admitting: Internal Medicine

## 2013-06-02 VITALS — BP 118/80 | HR 72 | Temp 98.3°F | Resp 16 | Ht 67.0 in | Wt 166.0 lb

## 2013-06-02 DIAGNOSIS — Z0000 Encounter for general adult medical examination without abnormal findings: Secondary | ICD-10-CM

## 2013-06-02 DIAGNOSIS — M5416 Radiculopathy, lumbar region: Secondary | ICD-10-CM

## 2013-06-02 DIAGNOSIS — K1379 Other lesions of oral mucosa: Secondary | ICD-10-CM

## 2013-06-02 DIAGNOSIS — K589 Irritable bowel syndrome without diarrhea: Secondary | ICD-10-CM

## 2013-06-02 DIAGNOSIS — K1370 Unspecified lesions of oral mucosa: Secondary | ICD-10-CM

## 2013-06-02 DIAGNOSIS — Z79899 Other long term (current) drug therapy: Secondary | ICD-10-CM | POA: Insufficient documentation

## 2013-06-02 DIAGNOSIS — IMO0002 Reserved for concepts with insufficient information to code with codable children: Secondary | ICD-10-CM

## 2013-06-02 DIAGNOSIS — R972 Elevated prostate specific antigen [PSA]: Secondary | ICD-10-CM

## 2013-06-02 LAB — URINALYSIS
BILIRUBIN URINE: NEGATIVE
Hgb urine dipstick: NEGATIVE
KETONES UR: NEGATIVE
LEUKOCYTES UA: NEGATIVE
Nitrite: NEGATIVE
SPECIFIC GRAVITY, URINE: 1.02 (ref 1.000–1.030)
Total Protein, Urine: NEGATIVE
UROBILINOGEN UA: 0.2 (ref 0.0–1.0)
Urine Glucose: NEGATIVE
pH: 6.5 (ref 5.0–8.0)

## 2013-06-02 LAB — HEPATIC FUNCTION PANEL
ALBUMIN: 4.3 g/dL (ref 3.5–5.2)
ALK PHOS: 61 U/L (ref 39–117)
ALT: 36 U/L (ref 0–53)
AST: 29 U/L (ref 0–37)
BILIRUBIN DIRECT: 0.1 mg/dL (ref 0.0–0.3)
TOTAL PROTEIN: 7 g/dL (ref 6.0–8.3)
Total Bilirubin: 0.6 mg/dL (ref 0.2–1.2)

## 2013-06-02 LAB — PSA: PSA: 10.12 ng/mL — AB (ref 0.10–4.00)

## 2013-06-02 LAB — CBC WITH DIFFERENTIAL/PLATELET
Basophils Absolute: 0 10*3/uL (ref 0.0–0.1)
Basophils Relative: 0.3 % (ref 0.0–3.0)
EOS PCT: 1.4 % (ref 0.0–5.0)
Eosinophils Absolute: 0.1 10*3/uL (ref 0.0–0.7)
HCT: 42 % (ref 39.0–52.0)
Hemoglobin: 14.1 g/dL (ref 13.0–17.0)
LYMPHS PCT: 29.5 % (ref 12.0–46.0)
Lymphs Abs: 2 10*3/uL (ref 0.7–4.0)
MCHC: 33.5 g/dL (ref 30.0–36.0)
MCV: 87.4 fl (ref 78.0–100.0)
MONO ABS: 0.6 10*3/uL (ref 0.1–1.0)
Monocytes Relative: 9.2 % (ref 3.0–12.0)
NEUTROS PCT: 59.6 % (ref 43.0–77.0)
Neutro Abs: 3.9 10*3/uL (ref 1.4–7.7)
PLATELETS: 221 10*3/uL (ref 150.0–400.0)
RBC: 4.81 Mil/uL (ref 4.22–5.81)
RDW: 13.4 % (ref 11.5–14.6)
WBC: 6.6 10*3/uL (ref 4.5–10.5)

## 2013-06-02 LAB — BASIC METABOLIC PANEL
BUN: 16 mg/dL (ref 6–23)
CALCIUM: 10.4 mg/dL (ref 8.4–10.5)
CO2: 29 mEq/L (ref 19–32)
CREATININE: 1 mg/dL (ref 0.4–1.5)
Chloride: 102 mEq/L (ref 96–112)
GFR: 78.63 mL/min (ref >60.00)
Glucose, Bld: 94 mg/dL (ref 70–99)
Potassium: 4.5 mEq/L (ref 3.5–5.1)
Sodium: 138 mEq/L (ref 135–145)

## 2013-06-02 LAB — TSH: TSH: 1.06 u[IU]/mL (ref 0.35–5.50)

## 2013-06-02 LAB — VITAMIN B12: Vitamin B-12: 419 pg/mL (ref 211–911)

## 2013-06-02 MED ORDER — HYOSCYAMINE SULFATE 0.125 MG PO TABS: ORAL_TABLET | ORAL | Status: DC

## 2013-06-02 MED ORDER — TRIAMCINOLONE ACETONIDE 0.1 % MT PSTE: 1.0000 "application " | PASTE | Freq: Three times a day (TID) | OROMUCOSAL | Status: DC

## 2013-06-02 MED ORDER — ACYCLOVIR 400 MG PO TABS: 400.0000 mg | ORAL_TABLET | Freq: Three times a day (TID) | ORAL | Status: DC

## 2013-06-02 MED ORDER — HYDROCODONE-ACETAMINOPHEN 5-325 MG PO TABS: 1.0000 | ORAL_TABLET | Freq: Two times a day (BID) | ORAL | Status: DC | PRN

## 2013-06-02 NOTE — Assessment & Plan Note (Signed)
We discussed age appropriate health related issues, including available/recomended screening tests and vaccinations. We discussed a need for adhering to healthy diet and exercise. Labs/EKG were reviewed/ordered. All questions were answered.   

## 2013-06-02 NOTE — Assessment & Plan Note (Signed)
L leg 4/15 Dr Tamala Julian MRI is pending

## 2013-06-02 NOTE — Progress Notes (Signed)
Pre visit review using our clinic review tool, if applicable. No additional management support is needed unless otherwise documented below in the visit note. 

## 2013-06-02 NOTE — Assessment & Plan Note (Signed)
515

## 2013-06-02 NOTE — Assessment & Plan Note (Signed)
Levsin prn  Gluten free trial (no wheat products) for 4-6 weeks. OK to use gluten-free bread and gluten-free pasta.  Milk free trial (no milk, ice cream, cheese and yogurt) for 4-6 weeks. OK to use almond, coconut, rice or soy milk. "Almond breeze" brand tastes good.

## 2013-06-02 NOTE — Patient Instructions (Signed)
Gluten free trial (no wheat products) for 4-6 weeks. OK to use gluten-free bread and gluten-free pasta.  Milk free trial (no milk, ice cream, cheese and yogurt) for 4-6 weeks. OK to use almond, coconut, rice or soy milk. "Almond breeze" brand tastes good.  

## 2013-06-02 NOTE — Progress Notes (Addendum)
   Subjective:     HPI  New pt  The patient is here for a wellness exam. The patient needs to address  chronic LBP and LLE pain C/o rash in the mouth x 12 months - painful sores C/o diarrhea off and on x 6 mo  Review of Systems  Constitutional: Negative for fever, appetite change, fatigue and unexpected weight change.  HENT: Positive for mouth sores and sore throat. Negative for congestion, nosebleeds, sneezing and trouble swallowing.   Eyes: Negative for itching and visual disturbance.  Respiratory: Negative for cough.   Cardiovascular: Negative for chest pain, palpitations and leg swelling.  Gastrointestinal: Positive for abdominal pain, diarrhea and abdominal distention. Negative for nausea and blood in stool.  Genitourinary: Negative for frequency and hematuria.  Musculoskeletal: Positive for arthralgias and back pain. Negative for gait problem, joint swelling and neck pain.  Skin: Negative for rash.  Neurological: Negative for dizziness, tremors, speech difficulty and weakness.  Psychiatric/Behavioral: Positive for sleep disturbance. Negative for suicidal ideas, behavioral problems, dysphoric mood and agitation. The patient is nervous/anxious.        Objective:   Physical Exam  Constitutional: He is oriented to person, place, and time. He appears well-developed and well-nourished. No distress.  HENT:  Head: Normocephalic and atraumatic.  Right Ear: External ear normal.  Left Ear: External ear normal.  Nose: Nose normal.  Mouth/Throat: Oropharynx is clear and moist. No oropharyngeal exudate.  2 small mouth ulcers  Eyes: Conjunctivae and EOM are normal. Pupils are equal, round, and reactive to light. Right eye exhibits no discharge. Left eye exhibits no discharge. No scleral icterus.  Neck: Normal range of motion. Neck supple. No JVD present. No tracheal deviation present. No thyromegaly present.  Cardiovascular: Normal rate, regular rhythm, normal heart sounds and intact  distal pulses.  Exam reveals no gallop and no friction rub.   No murmur heard. Pulmonary/Chest: Effort normal and breath sounds normal. No stridor. No respiratory distress. He has no wheezes. He has no rales. He exhibits no tenderness.  Abdominal: Soft. Bowel sounds are normal. He exhibits no distension and no mass. There is no tenderness. There is no rebound and no guarding.  Genitourinary: Rectum normal and penis normal. Guaiac negative stool. No penile tenderness.  Prostate is 1-2+  Musculoskeletal: Normal range of motion. He exhibits tenderness. He exhibits no edema.  LS is tender  Lymphadenopathy:    He has no cervical adenopathy.  Neurological: He is alert and oriented to person, place, and time. He has normal reflexes. No cranial nerve deficit. He exhibits normal muscle tone. Coordination normal.  Skin: Skin is warm and dry. No rash noted. He is not diaphoretic. No erythema. No pallor.  Psychiatric: His behavior is normal. Judgment and thought content normal.  anxious          Assessment & Plan:

## 2013-06-03 ENCOUNTER — Other Ambulatory Visit: Payer: Self-pay | Admitting: Internal Medicine

## 2013-06-03 DIAGNOSIS — R972 Elevated prostate specific antigen [PSA]: Secondary | ICD-10-CM

## 2013-06-03 LAB — VITAMIN D 25 HYDROXY (VIT D DEFICIENCY, FRACTURES): VIT D 25 HYDROXY: 31 ng/mL (ref 30–89)

## 2013-06-03 LAB — NMR LIPOPROFILE WITHOUT LIPIDS
HDL Particle Number: 33.8 umol/L (ref >=30.5)
HDL Size: 9.7 nm (ref >=9.2)
LARGE HDL: 8.3 umol/L (ref >=4.8)
LARGE VLDL-P: 2.1 nmol/L (ref <=2.7)
LDL PARTICLE NUMBER: 1622 nmol/L — AB (ref <1000)
LDL Size: 20.8 nm (ref >20.5)
LP-IR Score: 31 (ref <=45)
SMALL LDL PARTICLE NUMBER: 679 nmol/L — AB (ref <=527)
VLDL Size: 44.1 nm (ref <=46.6)

## 2013-06-03 MED ORDER — CIPROFLOXACIN HCL 500 MG PO TABS: 500.0000 mg | ORAL_TABLET | Freq: Two times a day (BID) | ORAL | Status: DC

## 2013-06-03 MED ORDER — VITAMIN D 1000 UNITS PO TABS: 1000.0000 [IU] | ORAL_TABLET | Freq: Every day | ORAL | Status: AC

## 2013-06-13 ENCOUNTER — Ambulatory Visit
Admission: RE | Admit: 2013-06-13 | Discharge: 2013-06-13 | Disposition: A | Discharge status: Home / Self Care | Payer: 59 | Source: Ambulatory Visit | Attending: Family Medicine | Admitting: Family Medicine

## 2013-06-13 DIAGNOSIS — G5702 Lesion of sciatic nerve, left lower limb: Secondary | ICD-10-CM

## 2013-06-13 DIAGNOSIS — M79609 Pain in unspecified limb: Secondary | ICD-10-CM

## 2013-06-13 DIAGNOSIS — M545 Low back pain, unspecified: Secondary | ICD-10-CM

## 2013-06-13 DIAGNOSIS — M5416 Radiculopathy, lumbar region: Secondary | ICD-10-CM

## 2013-06-17 ENCOUNTER — Other Ambulatory Visit: Payer: Self-pay | Admitting: *Deleted

## 2013-06-17 DIAGNOSIS — M48061 Spinal stenosis, lumbar region without neurogenic claudication: Secondary | ICD-10-CM

## 2013-06-30 ENCOUNTER — Ambulatory Visit
Admission: RE | Admit: 2013-06-30 | Discharge: 2013-06-30 | Disposition: A | Discharge status: Home / Self Care | Payer: 59 | Source: Ambulatory Visit | Attending: Family Medicine | Admitting: Family Medicine

## 2013-06-30 DIAGNOSIS — M48061 Spinal stenosis, lumbar region without neurogenic claudication: Secondary | ICD-10-CM

## 2013-06-30 MED ORDER — METHYLPREDNISOLONE ACETATE 40 MG/ML INJ SUSP (RADIOLOG
120.0000 mg | Freq: Once | INTRAMUSCULAR | Status: AC
  Administered 2013-06-30: 120 mg via EPIDURAL

## 2013-06-30 MED ORDER — IOHEXOL 180 MG/ML  SOLN
1.0000 mL | Freq: Once | INTRAMUSCULAR | Status: AC | PRN
  Administered 2013-06-30: 1 mL via EPIDURAL

## 2013-06-30 NOTE — Discharge Instructions (Signed)

## 2013-08-12 ENCOUNTER — Other Ambulatory Visit: Payer: 59

## 2013-08-12 ENCOUNTER — Encounter: Payer: Self-pay | Admitting: Internal Medicine

## 2013-08-12 ENCOUNTER — Ambulatory Visit (INDEPENDENT_AMBULATORY_CARE_PROVIDER_SITE_OTHER): Payer: 59 | Admitting: Internal Medicine

## 2013-08-12 VITALS — BP 150/90 | HR 80 | Temp 98.1°F | Resp 16 | Wt 162.0 lb

## 2013-08-12 DIAGNOSIS — K589 Irritable bowel syndrome without diarrhea: Secondary | ICD-10-CM

## 2013-08-12 DIAGNOSIS — R972 Elevated prostate specific antigen [PSA]: Secondary | ICD-10-CM

## 2013-08-12 DIAGNOSIS — N410 Acute prostatitis: Secondary | ICD-10-CM

## 2013-08-12 DIAGNOSIS — K1379 Other lesions of oral mucosa: Secondary | ICD-10-CM

## 2013-08-12 DIAGNOSIS — F411 Generalized anxiety disorder: Secondary | ICD-10-CM

## 2013-08-12 DIAGNOSIS — K1370 Unspecified lesions of oral mucosa: Secondary | ICD-10-CM

## 2013-08-12 DIAGNOSIS — M545 Low back pain, unspecified: Secondary | ICD-10-CM

## 2013-08-12 MED ORDER — HYDROCODONE-ACETAMINOPHEN 5-325 MG PO TABS: 1.0000 | ORAL_TABLET | Freq: Three times a day (TID) | ORAL | Status: DC | PRN

## 2013-08-12 MED ORDER — GABAPENTIN 100 MG PO CAPS
100.0000 mg | ORAL_CAPSULE | Freq: Every day | ORAL | Status: DC
Start: 2013-08-12 — End: 2014-10-06

## 2013-08-12 MED ORDER — VARDENAFIL HCL 20 MG PO TABS: 20.0000 mg | ORAL_TABLET | Freq: Every day | ORAL | Status: DC | PRN

## 2013-08-12 MED ORDER — ALPRAZOLAM 0.25 MG PO TABS: 0.2500 mg | ORAL_TABLET | Freq: Two times a day (BID) | ORAL | Status: DC | PRN

## 2013-08-12 NOTE — Progress Notes (Signed)
Pre visit review using our clinic review tool, if applicable. No additional management support is needed unless otherwise documented below in the visit note. 

## 2013-08-12 NOTE — Assessment & Plan Note (Addendum)
Chronic   Potential benefits of a long term benzodiazepines  use as well as potential risks  and complications were explained to the patient and were aknowledged. Xanax prn UDS

## 2013-08-12 NOTE — Assessment & Plan Note (Addendum)
Took Cipro in 5/14 Free PSA Lab Results  Component Value Date   PSA 10.12* 06/02/2013  Urol ref

## 2013-08-12 NOTE — Assessment & Plan Note (Addendum)
Continue with current prn prescription therapy as reflected on the Med list.  Potential benefits of a long term opioids use as well as potential risks (i.e. addiction risk, apnea etc) and complications (i.e. Somnolence, constipation and others) were explained to the patient and were aknowledged.   

## 2013-08-12 NOTE — Assessment & Plan Note (Signed)
He took Cipro

## 2013-08-12 NOTE — Assessment & Plan Note (Signed)
Resolved Zoirax helped

## 2013-08-13 ENCOUNTER — Encounter: Payer: Self-pay | Admitting: Internal Medicine

## 2013-08-13 LAB — PSA, TOTAL AND FREE
PSA, Free Pct: 15 % — ABNORMAL LOW (ref >25)
PSA, Free: 1.26 ng/mL
PSA: 8.63 ng/mL — AB (ref <=4.00)

## 2013-08-13 NOTE — Assessment & Plan Note (Signed)
Better on diet 

## 2013-08-13 NOTE — Addendum Note (Signed)
Addended by: Cassandria Anger on: 08/13/2013 08:36 PM   Modules accepted: Level of Service

## 2013-08-13 NOTE — Assessment & Plan Note (Signed)
Labs

## 2013-08-13 NOTE — Progress Notes (Signed)
Patient ID: Joseph Preston, male   DOB: 1950-06-27, 63 y.o.   MRN: [id number]   Subjective:     HPI    It is a f/u. The patient needs to address  chronic LBP and LLE pain F/u rash in the mouth x 12 months - painful sores - resolved C/o diarrhea off and on x 6 mo - much better milk-free  BP Readings from Last 3 Encounters:  08/12/13 150/90  06/30/13 132/82  06/02/13 118/80   Wt Readings from Last 3 Encounters:  08/12/13 162 lb (73.483 kg)  06/02/13 166 lb (75.297 kg)  05/09/13 167 lb (75.751 kg)      Review of Systems  Constitutional: Negative for fever, appetite change, fatigue and unexpected weight change.  HENT: Positive for mouth sores and sore throat. Negative for congestion, nosebleeds, sneezing and trouble swallowing.   Eyes: Negative for itching and visual disturbance.  Respiratory: Negative for cough.   Cardiovascular: Negative for chest pain, palpitations and leg swelling.  Gastrointestinal: Positive for abdominal pain, diarrhea and abdominal distention. Negative for nausea and blood in stool.  Genitourinary: Negative for frequency and hematuria.  Musculoskeletal: Positive for arthralgias and back pain. Negative for gait problem, joint swelling and neck pain.  Skin: Negative for rash.  Neurological: Negative for dizziness, tremors, speech difficulty and weakness.  Psychiatric/Behavioral: Positive for sleep disturbance. Negative for suicidal ideas, behavioral problems, dysphoric mood and agitation. The patient is nervous/anxious.        Objective:   Physical Exam  Constitutional: He is oriented to person, place, and time. He appears well-developed and well-nourished. No distress.  HENT:  Head: Normocephalic and atraumatic.  Right Ear: External ear normal.  Left Ear: External ear normal.  Nose: Nose normal.  Mouth/Throat: Oropharynx is clear and moist. No oropharyngeal exudate.  2 small mouth ulcers  Eyes: Conjunctivae and EOM are normal. Pupils are equal,  round, and reactive to light. Right eye exhibits no discharge. Left eye exhibits no discharge. No scleral icterus.  Neck: Normal range of motion. Neck supple. No JVD present. No tracheal deviation present. No thyromegaly present.  Cardiovascular: Normal rate, regular rhythm, normal heart sounds and intact distal pulses.  Exam reveals no gallop and no friction rub.   No murmur heard. Pulmonary/Chest: Effort normal and breath sounds normal. No stridor. No respiratory distress. He has no wheezes. He has no rales. He exhibits no tenderness.  Abdominal: Soft. Bowel sounds are normal. He exhibits no distension and no mass. There is no tenderness. There is no rebound and no guarding.  Genitourinary: Rectum normal and penis normal. Guaiac negative stool. No penile tenderness.  Musculoskeletal: Normal range of motion. He exhibits tenderness. He exhibits no edema.  LS is tender  Lymphadenopathy:    He has no cervical adenopathy.  Neurological: He is alert and oriented to person, place, and time. He has normal reflexes. No cranial nerve deficit. He exhibits normal muscle tone. Coordination normal.  Skin: Skin is warm and dry. No rash noted. He is not diaphoretic. No erythema. No pallor.  Psychiatric: His behavior is normal. Judgment and thought content normal.  anxious   Lab Results  Component Value Date   WBC 6.6 06/02/2013   HGB 14.1 06/02/2013   HCT 42.0 06/02/2013   PLT 221.0 06/02/2013   GLUCOSE 94 06/02/2013   ALT 36 06/02/2013   AST 29 06/02/2013   NA 138 06/02/2013   K 4.5 06/02/2013   CL 102 06/02/2013   CREATININE 1.0 06/02/2013  BUN 16 06/02/2013   CO2 29 06/02/2013   TSH 1.06 06/02/2013   PSA 8.63* 08/12/2013          Assessment & Plan:

## 2013-08-15 ENCOUNTER — Telehealth: Payer: Self-pay | Admitting: *Deleted

## 2013-08-15 NOTE — Telephone Encounter (Signed)
Pt's wife is calling requesting his lab results. Please advise.

## 2013-08-18 NOTE — Telephone Encounter (Signed)
Elev PSA. Joseph Preston needs to see a Urologist - ref made - Dr Risa Grill Thx

## 2013-08-18 NOTE — Telephone Encounter (Signed)
Wife notified.

## 2013-08-26 ENCOUNTER — Telehealth: Payer: Self-pay

## 2013-08-26 NOTE — Telephone Encounter (Signed)
Pt requested rx for Flexerel to be called in.

## 2013-08-26 NOTE — Telephone Encounter (Signed)
Pt stated that they lost the bottle and that they should have another refill on the rx and should not need it sent in.

## 2013-09-30 ENCOUNTER — Other Ambulatory Visit (INDEPENDENT_AMBULATORY_CARE_PROVIDER_SITE_OTHER): Payer: 59

## 2013-09-30 ENCOUNTER — Ambulatory Visit (INDEPENDENT_AMBULATORY_CARE_PROVIDER_SITE_OTHER): Payer: 59 | Admitting: Family Medicine

## 2013-09-30 ENCOUNTER — Encounter: Payer: Self-pay | Admitting: Family Medicine

## 2013-09-30 VITALS — BP 130/80 | HR 64 | Ht 67.0 in | Wt 159.0 lb

## 2013-09-30 DIAGNOSIS — G5702 Lesion of sciatic nerve, left lower limb: Secondary | ICD-10-CM

## 2013-09-30 DIAGNOSIS — M5416 Radiculopathy, lumbar region: Secondary | ICD-10-CM

## 2013-09-30 DIAGNOSIS — IMO0002 Reserved for concepts with insufficient information to code with codable children: Secondary | ICD-10-CM

## 2013-09-30 DIAGNOSIS — G5700 Lesion of sciatic nerve, unspecified lower limb: Secondary | ICD-10-CM

## 2013-09-30 NOTE — Progress Notes (Signed)
[name] [address], [place] [address] Phone: [phone] Subjective:     CC: back and leg pain follow up  IHK:[id number] MYLZ Joseph Preston is a 63 y.o. male coming in with complaint of back and leg pain. Patient's did have x-rays and was found to have L4-L5 as well as L5-S1 degenerative disc disease. Patient also has some mild grade 1 anterior listhesis. Patient was given prednisone as well as started on gabapentin and night for the radicular symptoms. Patient has been doing home exercises and unfortunately cannot do formal physical therapy secondary to financial restraints. Patient was having worsening pain and was sent for an MRI of the lumbar spine. Patient did have an epidural at the L4-L5 region June 30, 2013. Patient did have about one month history of being near pain-free. Patient states that the pain is slowly come back. Very similar with radicular symptoms going down the left leg. Denies any numbness or weakness but continues to have the radicular symptoms. This is affecting his job again. Continues with the pain medications. These are prescribed by primary care provider. No new symptoms just is worsening of previous symptoms.     Past medical history, social, surgical and family history all reviewed in electronic medical record.   Review of Systems: No headache, visual changes, nausea, vomiting, diarrhea, constipation, dizziness, abdominal pain, skin rash, fevers, chills, night sweats, weight loss, swollen lymph nodes, body aches, joint swelling, muscle aches, chest pain, shortness of breath, mood changes.   Objective Blood pressure 130/80, pulse 64, height 5\' 7"  (1.702 m), weight 159 lb (72.122 kg), SpO2 98.00%.   General: No apparent distress alert and oriented x3 mood and affect normal, dressed appropriately.  HEENT: Pupils equal, extraocular movements intact  Respiratory: Patient's speak in full sentences and does not appear short of  breath  Cardiovascular: No lower extremity edema, non tender, no erythema  Skin: Warm dry intact with no signs of infection or rash on extremities or on axial skeleton.  Abdomen: Soft nontender  Neuro: Cranial nerves II through XII are intact, neurovascularly intact in all extremities with 2+ DTRs and 2+ pulses.  Lymph: No lymphadenopathy of posterior or anterior cervical chain or axillae bilaterally.   MSK:  Non tender with full range of motion and good stability and symmetric strength and tone of shoulders, elbows, wrist, hip, knee and ankles bilaterally.  Back Exam:  Inspection: Unremarkable poor course strength Motion: Flexion 35 deg, Extension 35 deg, Side Bending to 45 deg bilaterally,  Rotation to 35 deg bilaterally  SLR laying: Positive left XSLR laying: Negative  Palpable tenderness: Mild tenderness of the paraspinal musculature of the lumbar spine with severe tenderness in the piriformis muscle. FABER: Positive on left. Sensory change: Gross sensation intact to all lumbar and sacral dermatomes.  Reflexes: 3+ at both patellar tendons on the side compared to 2+ on right side, 3+ at achilles tendons on left side compared to 2+ on right side, Babinski's downgoing. No beats of clonus Strength at foot  Plantar-flexion: 5/5 Dorsi-flexion: 5/5 Eversion: 5/5 Inversion: 5/5  Leg strength  Quad: 3/5 on the left compared to contralateral side Hamstring: 3/5 on left Hip flexor: 3/5 on left Hip abductors: 3/5  Gait antalgic  MSK US performed of: Left This study was ordered, performed, and interpreted by Charlann Boxer D.O.  Hip: Trochanteric bursa without swelling or effusion. Acetabular labrum visualized and without tears, displacement, or effusion in joint. Femoral neck appears unremarkable without increased power  doppler signal along Cortex. Significant swelling noted around the sciatic nerve at the piriformis muscle. After verbal consent patient was prepped with alcohol swabs and with a  21-gauge 2 inch needle was injected with 4 cc of 0.5% Marcaine and 1 cc of Kenalog 40 mg/dL with a hydrodissection around the sciatic nerve. Patient tolerated the procedure well with near complete resolution of pain and still neurovascularly intact distally.  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     Impression and Recommendations:     This case required medical decision making of moderate complexity.

## 2013-09-30 NOTE — Assessment & Plan Note (Signed)
Patient did have near complete resolution of pain after having hydrodissection around the sciatic nerve. Patient was given a new exercise program I think will be beneficial and we discussed about manual massage that could be helpful. Patient will continue on his other medications at this time and we'll see if he makes an improvement. Patient will call in 48-72 hours and the pain seems to return and we'll consider another epidural in his back. We discussed again that formal physical therapy could be beneficial which patient declined.  Spent greater than 25 minutes with patient face-to-face and had greater than 50% of counseling including as described above in assessment and plan.

## 2013-09-30 NOTE — Patient Instructions (Signed)
Good to see you as always.  Try the new exercises 3 times a week.  Tennis ball back left pocket.  Call me in 2-3 days and if pain comes back we can consider another epidural injection.  We will discuss when to come back on the phone.

## 2013-09-30 NOTE — Assessment & Plan Note (Signed)
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 Patient continues to have difficulty the with the pain we will consider having patient have another epidural which patient did respond to previously.

## 2013-10-02 ENCOUNTER — Telehealth: Payer: Self-pay | Admitting: Internal Medicine

## 2013-10-02 DIAGNOSIS — M5416 Radiculopathy, lumbar region: Secondary | ICD-10-CM

## 2013-10-02 NOTE — Telephone Encounter (Signed)
Discussed with pt's wife. Entered order for epidural.

## 2013-10-02 NOTE — Telephone Encounter (Signed)
Patient's spouse called stating the patient is still in excruciating pain. She would like a call back as soon as possible. CB# [phone]

## 2013-10-02 NOTE — Telephone Encounter (Signed)
Double up the gabapentin at night.  Epidural is already ordered.  If pain gets too bad go to ED.

## 2013-10-07 ENCOUNTER — Ambulatory Visit: Payer: 59 | Admitting: Internal Medicine

## 2013-10-16 ENCOUNTER — Ambulatory Visit
Admission: RE | Admit: 2013-10-16 | Discharge: 2013-10-16 | Disposition: A | Discharge status: Home / Self Care | Payer: 59 | Source: Ambulatory Visit | Attending: Family Medicine | Admitting: Family Medicine

## 2013-10-16 VITALS — BP 155/93 | HR 63

## 2013-10-16 DIAGNOSIS — M5416 Radiculopathy, lumbar region: Secondary | ICD-10-CM

## 2013-10-16 DIAGNOSIS — F411 Generalized anxiety disorder: Secondary | ICD-10-CM

## 2013-10-16 MED ORDER — METHYLPREDNISOLONE ACETATE 40 MG/ML INJ SUSP (RADIOLOG
120.0000 mg | Freq: Once | INTRAMUSCULAR | Status: AC
  Administered 2013-10-16: 120 mg via EPIDURAL

## 2013-10-16 MED ORDER — IOHEXOL 180 MG/ML  SOLN
1.0000 mL | Freq: Once | INTRAMUSCULAR | Status: AC | PRN
  Administered 2013-10-16: 1 mL via EPIDURAL

## 2013-10-24 ENCOUNTER — Telehealth: Payer: Self-pay | Admitting: Internal Medicine

## 2013-10-24 ENCOUNTER — Telehealth: Payer: Self-pay | Admitting: *Deleted

## 2013-10-24 MED ORDER — CYCLOBENZAPRINE HCL 10 MG PO TABS: 10.0000 mg | ORAL_TABLET | Freq: Three times a day (TID) | ORAL | Status: DC | PRN

## 2013-10-24 NOTE — Telephone Encounter (Signed)
Refill done.  

## 2013-10-24 NOTE — Telephone Encounter (Signed)
Call and tell him iron and magnesium as well as a multivitamin would be my first. Step.  No pain  meds for cramping, ever.

## 2013-10-24 NOTE — Telephone Encounter (Signed)
Pt called in and said that he is having craps in his legs so bad he can not sleep.  He is working and cant get off.  Wants to know if Dr Tamala Julian can call him in something for the craps.  Best number is to call wife at [address]

## 2013-10-24 NOTE — Telephone Encounter (Signed)
Discussed with pt's wife. 

## 2013-10-31 ENCOUNTER — Other Ambulatory Visit: Payer: Self-pay | Admitting: Urology

## 2013-10-31 ENCOUNTER — Ambulatory Visit (INDEPENDENT_AMBULATORY_CARE_PROVIDER_SITE_OTHER): Payer: 59 | Admitting: Urology

## 2013-10-31 DIAGNOSIS — R972 Elevated prostate specific antigen [PSA]: Secondary | ICD-10-CM

## 2013-10-31 DIAGNOSIS — N401 Enlarged prostate with lower urinary tract symptoms: Secondary | ICD-10-CM

## 2013-11-04 ENCOUNTER — Encounter: Payer: Self-pay | Admitting: Internal Medicine

## 2013-11-04 ENCOUNTER — Ambulatory Visit (INDEPENDENT_AMBULATORY_CARE_PROVIDER_SITE_OTHER): Payer: 59 | Admitting: Internal Medicine

## 2013-11-04 VITALS — BP 134/90 | HR 72 | Temp 97.6°F | Wt 157.0 lb

## 2013-11-04 DIAGNOSIS — M5416 Radiculopathy, lumbar region: Secondary | ICD-10-CM

## 2013-11-04 DIAGNOSIS — K1379 Other lesions of oral mucosa: Secondary | ICD-10-CM

## 2013-11-04 DIAGNOSIS — R972 Elevated prostate specific antigen [PSA]: Secondary | ICD-10-CM

## 2013-11-04 DIAGNOSIS — F411 Generalized anxiety disorder: Secondary | ICD-10-CM

## 2013-11-04 MED ORDER — HYDROCODONE-ACETAMINOPHEN 7.5-325 MG PO TABS: 1.0000 | ORAL_TABLET | Freq: Four times a day (QID) | ORAL | Status: DC | PRN

## 2013-11-04 NOTE — Assessment & Plan Note (Signed)
Potential benefits of a long term opioids use as well as potential risks (i.e. addiction risk, apnea etc) and complications (i.e. Somnolence, constipation and others) were explained to the patient and were aknowledged. Epidural njections did not help Will increase Hydrocodone dose

## 2013-11-04 NOTE — Assessment & Plan Note (Signed)
Prostate bx is scheduled

## 2013-11-04 NOTE — Progress Notes (Signed)
Pre visit review using our clinic review tool, if applicable. No additional management support is needed unless otherwise documented below in the visit note. 

## 2013-11-04 NOTE — Progress Notes (Signed)
Subjective:     HPI    It is a f/u. The patient is here to address  chronic LBP and LLE pain - 7/10 on rx F/u rash in the mouth x 12 months - painful sores - resolved C/o diarrhea off and on x 6 mo - much better milk-free  BP Readings from Last 3 Encounters:  11/04/13 134/90  10/16/13 155/93  09/30/13 130/80   Wt Readings from Last 3 Encounters:  11/04/13 157 lb (71.215 kg)  09/30/13 159 lb (72.122 kg)  08/12/13 162 lb (73.483 kg)      Review of Systems  Constitutional: Negative for fever, appetite change, fatigue and unexpected weight change.  HENT: Positive for mouth sores and sore throat. Negative for congestion, nosebleeds, sneezing and trouble swallowing.   Eyes: Negative for itching and visual disturbance.  Respiratory: Negative for cough.   Cardiovascular: Negative for chest pain, palpitations and leg swelling.  Gastrointestinal: Positive for abdominal pain, diarrhea and abdominal distention. Negative for nausea and blood in stool.  Genitourinary: Negative for frequency and hematuria.  Musculoskeletal: Positive for arthralgias and back pain. Negative for gait problem, joint swelling and neck pain.  Skin: Negative for rash.  Neurological: Negative for dizziness, tremors, speech difficulty and weakness.  Psychiatric/Behavioral: Positive for sleep disturbance. Negative for suicidal ideas, behavioral problems, dysphoric mood and agitation. The patient is nervous/anxious.        Objective:   Physical Exam  Constitutional: He is oriented to person, place, and time. He appears well-developed and well-nourished. No distress.  HENT:  Head: Normocephalic and atraumatic.  Right Ear: External ear normal.  Left Ear: External ear normal.  Nose: Nose normal.  Mouth/Throat: Oropharynx is clear and moist. No oropharyngeal exudate.  2 small mouth ulcers  Eyes: Conjunctivae and EOM are normal. Pupils are equal, round, and reactive to light. Right eye exhibits no discharge.  Left eye exhibits no discharge. No scleral icterus.  Neck: Normal range of motion. Neck supple. No JVD present. No tracheal deviation present. No thyromegaly present.  Cardiovascular: Normal rate, regular rhythm, normal heart sounds and intact distal pulses.  Exam reveals no gallop and no friction rub.   No murmur heard. Pulmonary/Chest: Effort normal and breath sounds normal. No stridor. No respiratory distress. He has no wheezes. He has no rales. He exhibits no tenderness.  Abdominal: Soft. Bowel sounds are normal. He exhibits no distension and no mass. There is no tenderness. There is no rebound and no guarding.  Genitourinary: Rectum normal and penis normal. Guaiac negative stool. No penile tenderness.  Musculoskeletal: Normal range of motion. He exhibits tenderness. He exhibits no edema.  LS is tender  Lymphadenopathy:    He has no cervical adenopathy.  Neurological: He is alert and oriented to person, place, and time. He has normal reflexes. No cranial nerve deficit. He exhibits normal muscle tone. Coordination normal.  Skin: Skin is warm and dry. No rash noted. He is not diaphoretic. No erythema. No pallor.  Psychiatric: His behavior is normal. Judgment and thought content normal.  anxious   Lab Results  Component Value Date   WBC 6.6 06/02/2013   HGB 14.1 06/02/2013   HCT 42.0 06/02/2013   PLT 221.0 06/02/2013   GLUCOSE 94 06/02/2013   ALT 36 06/02/2013   AST 29 06/02/2013   NA 138 06/02/2013   K 4.5 06/02/2013   CL 102 06/02/2013   CREATININE 1.0 06/02/2013   BUN 16 06/02/2013   CO2 29 06/02/2013   TSH 1.06  06/02/2013   PSA 8.63* 08/12/2013          Assessment & Plan:

## 2013-11-04 NOTE — Assessment & Plan Note (Signed)
10/15 Valtrex x 10 d

## 2013-11-04 NOTE — Assessment & Plan Note (Signed)
Continue with current prn prescription therapy as reflected on the Med list.  

## 2013-11-09 ENCOUNTER — Encounter: Payer: Self-pay | Admitting: Internal Medicine

## 2013-11-12 ENCOUNTER — Ambulatory Visit: Payer: 59 | Admitting: Internal Medicine

## 2013-11-20 ENCOUNTER — Encounter: Payer: Self-pay | Admitting: Internal Medicine

## 2013-11-25 ENCOUNTER — Other Ambulatory Visit: Payer: Self-pay | Admitting: Urology

## 2013-11-25 DIAGNOSIS — R972 Elevated prostate specific antigen [PSA]: Secondary | ICD-10-CM

## 2013-12-19 ENCOUNTER — Encounter (HOSPITAL_COMMUNITY): Payer: Self-pay

## 2013-12-19 ENCOUNTER — Ambulatory Visit (HOSPITAL_COMMUNITY)
Admission: RE | Admit: 2013-12-19 | Discharge: 2013-12-19 | Disposition: A | Discharge status: Home / Self Care | Payer: 59 | Source: Ambulatory Visit | Attending: Urology | Admitting: Urology

## 2013-12-19 DIAGNOSIS — R972 Elevated prostate specific antigen [PSA]: Secondary | ICD-10-CM | POA: Insufficient documentation

## 2013-12-19 MED ORDER — GENTAMICIN SULFATE 40 MG/ML IJ SOLN
160.0000 mg | Freq: Once | INTRAMUSCULAR | Status: AC
  Administered 2013-12-19: 160 mg via INTRAMUSCULAR

## 2013-12-19 MED ORDER — LIDOCAINE HCL (PF) 2 % IJ SOLN
INTRAMUSCULAR | Status: AC
  Administered 2013-12-19: 10 mL
  Filled 2013-12-19: qty 10

## 2013-12-19 MED ORDER — LIDOCAINE HCL (PF) 2 % IJ SOLN
10.0000 mL | Freq: Once | INTRAMUSCULAR | Status: AC
  Administered 2013-12-19: 10 mL

## 2013-12-19 MED ORDER — GENTAMICIN SULFATE 40 MG/ML IJ SOLN
INTRAMUSCULAR | Status: AC
  Administered 2013-12-19: 160 mg via INTRAMUSCULAR
  Filled 2013-12-19: qty 4

## 2013-12-19 NOTE — Discharge Instructions (Signed)
Transrectal Ultrasound-Guided Biopsy °A transrectal ultrasound-guided biopsy is a procedure to remove samples of tissue from your prostate using ultrasound images to guide the procedure. The procedure is usually done to evaluate the prostate gland of men who have an elevated prostate-specific antigen (PSA). PSA is a blood test to screen for prostate cancer. The biopsy samples are taken to check for prostate cancer.  °LET YOUR HEALTH CARE PROVIDER KNOW ABOUT: °· Any allergies you have. °· All medicines you are taking, including vitamins, herbs, eye drops, creams, and over-the-counter medicines. °· Previous problems you or members of your family have had with the use of anesthetics. °· Any blood disorders you have. °· Previous surgeries you have had. °· Medical conditions you have. °RISKS AND COMPLICATIONS °Generally, this is a safe procedure. However, as with any procedure, problems can occur. Possible problems include: °· Infection of your prostate. °· Bleeding from your rectum or blood in your urine. °· Difficulty urinating. °· Nerve damage (this is usually temporary). °· Damage to surrounding structures such as blood vessels, organs, and muscles, which would require other procedures. °BEFORE THE PROCEDURE °· Do not eat or drink anything after midnight on the night before the procedure or as directed by your health care provider. °· Take medicines only as directed by your health care provider. °· Your health care provider may have you stop taking certain medicines 5-7 days before the procedure. °· You will be given an enema before the procedure. During an enema, a liquid is injected into your rectum to clear out waste. °· You may have lab tests the day of your procedure.   °· Plan to have someone take you home after the procedure. °PROCEDURE  °· You will be given medicine to help you relax (sedative) before the procedure. An IV tube will be inserted into one of your veins and used to give fluids and  medicine. °· You will be given antibiotic medicine to reduce the risk of an infection. °· You will be placed on your side for the procedure. °· A probe with lubricated gel will be placed into your rectum, and images will be taken of your prostate and surrounding structures. °· Numbing medicine will be injected into the prostate before the biopsy samples are taken. °· A biopsy needle will then be inserted and guided to your prostate with the use of the ultrasound images. °· Samples of prostate tissue will be taken, and the needle will then be removed. °· The biopsy samples will be sent to a lab to be analyzed. Results are usually back in 2-3 days. °AFTER THE PROCEDURE °· You will be taken to a recovery area where you will be monitored. °· You may have some discomfort in the rectal area. You will be given pain medicines to control this. °· You may be allowed to go home the same day, or you may need to stay in the hospital overnight. °Document Released: 06/02/2013 Document Reviewed: 09/04/2012 °[employer]® Patient Information ©2015 [employer]. This information is not intended to replace advice given to you by your health care provider. Make sure you discuss any questions you have with your health care provider. ° °

## 2013-12-23 ENCOUNTER — Ambulatory Visit: Payer: 59 | Admitting: Family Medicine

## 2014-01-06 ENCOUNTER — Encounter: Payer: Self-pay | Admitting: Internal Medicine

## 2014-01-06 ENCOUNTER — Ambulatory Visit (INDEPENDENT_AMBULATORY_CARE_PROVIDER_SITE_OTHER): Payer: 59 | Admitting: Internal Medicine

## 2014-01-06 VITALS — BP 116/76 | HR 62 | Temp 97.4°F | Resp 18 | Ht 67.0 in | Wt 157.6 lb

## 2014-01-06 DIAGNOSIS — F411 Generalized anxiety disorder: Secondary | ICD-10-CM

## 2014-01-06 DIAGNOSIS — R972 Elevated prostate specific antigen [PSA]: Secondary | ICD-10-CM

## 2014-01-06 DIAGNOSIS — M5440 Lumbago with sciatica, unspecified side: Secondary | ICD-10-CM

## 2014-01-06 MED ORDER — CYCLOBENZAPRINE HCL 10 MG PO TABS: 10.0000 mg | ORAL_TABLET | Freq: Three times a day (TID) | ORAL | Status: DC | PRN

## 2014-01-06 MED ORDER — HYDROCODONE-ACETAMINOPHEN 7.5-325 MG PO TABS: 1.0000 | ORAL_TABLET | Freq: Four times a day (QID) | ORAL | Status: DC | PRN

## 2014-01-06 NOTE — Assessment & Plan Note (Signed)
Chronic   Potential benefits of a long term opioids use as well as potential risks (i.e. addiction risk, apnea etc) and complications (i.e. Somnolence, constipation and others) were explained to the patient and were aknowledged. 

## 2014-01-06 NOTE — Assessment & Plan Note (Signed)
Continue with current prn prescription therapy as reflected on the Med list.  

## 2014-01-06 NOTE — Progress Notes (Addendum)
   Subjective:    Patient ID: Joseph Preston, male    DOB: [birth year], 63 y.o.   MRN: [id number]  HPI   The patient is here to address  chronic LBP and LLE pain - mild on current rx F/u rash in the mouth x 12 months - painful sores - resolved F/u diarrhea off and on x 6 mo - much better milk-free F/u elev PSA - s/p bx  Wt Readings from Last 3 Encounters:  01/06/14 157 lb 9.6 oz (71.487 kg)  11/04/13 157 lb (71.215 kg)  09/30/13 159 lb (72.122 kg)   BP Readings from Last 3 Encounters:  01/06/14 116/76  12/19/13 130/87  11/04/13 134/90      Review of Systems  Constitutional: Negative for appetite change, fatigue and unexpected weight change.  HENT: Negative for congestion, nosebleeds, sneezing, sore throat and trouble swallowing.   Eyes: Negative for itching and visual disturbance.  Respiratory: Negative for cough.   Cardiovascular: Negative for chest pain, palpitations and leg swelling.  Gastrointestinal: Positive for constipation. Negative for nausea, abdominal pain, diarrhea, blood in stool and abdominal distention.  Genitourinary: Negative for urgency, frequency and hematuria.  Musculoskeletal: Negative for back pain, joint swelling, gait problem and neck pain.  Skin: Negative for rash.  Neurological: Negative for dizziness, tremors, speech difficulty, weakness and headaches.  Psychiatric/Behavioral: Negative for suicidal ideas, sleep disturbance, dysphoric mood and agitation. The patient is nervous/anxious.        Objective:   Physical Exam  Constitutional: He is oriented to person, place, and time. He appears well-developed. No distress.  NAD  HENT:  Mouth/Throat: Oropharynx is clear and moist.  Eyes: Conjunctivae are normal. Pupils are equal, round, and reactive to light.  Neck: Normal range of motion. No JVD present. No thyromegaly present.  Cardiovascular: Normal rate, regular rhythm, normal heart sounds and intact distal pulses.  Exam reveals no gallop and no  friction rub.   No murmur heard. Pulmonary/Chest: Effort normal and breath sounds normal. No respiratory distress. He has no wheezes. He has no rales. He exhibits no tenderness.  Abdominal: Soft. Bowel sounds are normal. He exhibits no distension and no mass. There is no tenderness. There is no rebound and no guarding.  Musculoskeletal: Normal range of motion. He exhibits tenderness. He exhibits no edema.  Lymphadenopathy:    He has no cervical adenopathy.  Neurological: He is alert and oriented to person, place, and time. He has normal reflexes. No cranial nerve deficit. He exhibits normal muscle tone. He displays a negative Romberg sign. Coordination and gait normal.  Skin: Skin is warm and dry. No rash noted.  Psychiatric: He has a normal mood and affect. His behavior is normal. Judgment and thought content normal.  LS spine is less tender      Assessment & Plan:

## 2014-01-06 NOTE — Progress Notes (Signed)
Pre visit review using our clinic review tool, if applicable. No additional management support is needed unless otherwise documented below in the visit note. 

## 2014-01-06 NOTE — Assessment & Plan Note (Addendum)
S/p prostate bx - ok 2015

## 2014-04-07 ENCOUNTER — Encounter: Payer: Self-pay | Admitting: Internal Medicine

## 2014-04-07 ENCOUNTER — Ambulatory Visit (INDEPENDENT_AMBULATORY_CARE_PROVIDER_SITE_OTHER): Payer: 59 | Admitting: Internal Medicine

## 2014-04-07 VITALS — BP 138/90 | HR 83 | Temp 97.9°F | Wt 152.5 lb

## 2014-04-07 DIAGNOSIS — M5416 Radiculopathy, lumbar region: Secondary | ICD-10-CM

## 2014-04-07 DIAGNOSIS — M5441 Lumbago with sciatica, right side: Secondary | ICD-10-CM

## 2014-04-07 DIAGNOSIS — J069 Acute upper respiratory infection, unspecified: Secondary | ICD-10-CM

## 2014-04-07 MED ORDER — CYCLOBENZAPRINE HCL ER 15 MG PO CP24: 15.0000 mg | ORAL_CAPSULE | Freq: Every day | ORAL | Status: DC | PRN

## 2014-04-07 MED ORDER — HYDROCODONE BITARTRATE ER 40 MG PO T24A: 40.0000 mg | EXTENDED_RELEASE_TABLET | Freq: Every day | ORAL | Status: DC

## 2014-04-07 MED ORDER — AZITHROMYCIN 250 MG PO TABS: ORAL_TABLET | ORAL | Status: DC

## 2014-04-07 MED ORDER — ALPRAZOLAM 0.25 MG PO TABS: 0.2500 mg | ORAL_TABLET | Freq: Two times a day (BID) | ORAL | Status: DC | PRN

## 2014-04-07 NOTE — Assessment & Plan Note (Addendum)
Norco prn - stopped 3/16 Start Hysingla ER 40 mg/d Amrix 15 mg qhs NS consult Dr Hal Neer

## 2014-04-07 NOTE — Assessment & Plan Note (Signed)
Zpac 

## 2014-04-07 NOTE — Progress Notes (Signed)
   Subjective:    Patient ID: Joseph Preston, male    DOB: 1950-02-09, 64 y.o.   MRN: [id number]  HPI   The patient is here to address  chronic LBP and LLE pain - mild on current rx F/u rash in the mouth x 12 months - painful sores - resolved F/u diarrhea off and on x 6 mo - much better milk-free F/u elev PSA - s/p bx  Wt Readings from Last 3 Encounters:  04/07/14 152 lb 8 oz (69.174 kg)  01/06/14 157 lb 9.6 oz (71.487 kg)  11/04/13 157 lb (71.215 kg)   BP Readings from Last 3 Encounters:  04/07/14 138/90  01/06/14 116/76  11/04/13 134/90      Review of Systems  Constitutional: Negative for appetite change, fatigue and unexpected weight change.  HENT: Negative for congestion, nosebleeds, sneezing, sore throat and trouble swallowing.   Eyes: Negative for itching and visual disturbance.  Respiratory: Negative for cough.   Cardiovascular: Negative for chest pain, palpitations and leg swelling.  Gastrointestinal: Positive for constipation. Negative for nausea, abdominal pain, diarrhea, blood in stool and abdominal distention.  Genitourinary: Negative for urgency, frequency and hematuria.  Musculoskeletal: Negative for back pain, joint swelling, gait problem and neck pain.  Skin: Negative for rash.  Neurological: Negative for dizziness, tremors, speech difficulty, weakness and headaches.  Psychiatric/Behavioral: Negative for suicidal ideas, sleep disturbance, dysphoric mood and agitation. The patient is nervous/anxious.        Objective:   Physical Exam  Constitutional: He is oriented to person, place, and time. He appears well-developed. No distress.  NAD  HENT:  Mouth/Throat: Oropharynx is clear and moist.  Eyes: Conjunctivae are normal. Pupils are equal, round, and reactive to light.  Neck: Normal range of motion. No JVD present. No thyromegaly present.  Cardiovascular: Normal rate, regular rhythm, normal heart sounds and intact distal pulses.  Exam reveals no gallop  and no friction rub.   No murmur heard. Pulmonary/Chest: Effort normal and breath sounds normal. No respiratory distress. He has no wheezes. He has no rales. He exhibits no tenderness.  Abdominal: Soft. Bowel sounds are normal. He exhibits no distension and no mass. There is no tenderness. There is no rebound and no guarding.  Musculoskeletal: Normal range of motion. He exhibits tenderness. He exhibits no edema.  Lymphadenopathy:    He has no cervical adenopathy.  Neurological: He is alert and oriented to person, place, and time. He has normal reflexes. No cranial nerve deficit. He exhibits normal muscle tone. He displays a negative Romberg sign. Coordination and gait normal.  Skin: Skin is warm and dry. No rash noted.  Psychiatric: He has a normal mood and affect. His behavior is normal. Judgment and thought content normal.  LS spine is less tender  Lab Results  Component Value Date   WBC 6.6 06/02/2013   HGB 14.1 06/02/2013   HCT 42.0 06/02/2013   PLT 221.0 06/02/2013   GLUCOSE 94 06/02/2013   ALT 36 06/02/2013   AST 29 06/02/2013   NA 138 06/02/2013   K 4.5 06/02/2013   CL 102 06/02/2013   CREATININE 1.0 06/02/2013   BUN 16 06/02/2013   CO2 29 06/02/2013   TSH 1.06 06/02/2013   PSA 8.63* 08/12/2013       Assessment & Plan:

## 2014-04-07 NOTE — Progress Notes (Signed)
Pre visit review using our clinic review tool, if applicable. No additional management support is needed unless otherwise documented below in the visit note. 

## 2014-04-07 NOTE — Assessment & Plan Note (Addendum)
NS consult  Dr Alvera Novel prn - stopped 3/16 3/16 Start Hysingla ER 40 mg/d Amrix 15 mg qhs NS consult Dr Hal Neer

## 2014-04-21 ENCOUNTER — Telehealth: Payer: Self-pay | Admitting: *Deleted

## 2014-04-21 NOTE — Telephone Encounter (Signed)
PCP wants urine drug screen in 1 month. Pt's wife informed to have pt come around 05/08/14 for UDS to go to [facility].

## 2014-04-27 ENCOUNTER — Other Ambulatory Visit: Payer: 59

## 2014-05-01 ENCOUNTER — Telehealth: Payer: Self-pay | Admitting: Internal Medicine

## 2014-05-01 DIAGNOSIS — M5416 Radiculopathy, lumbar region: Secondary | ICD-10-CM

## 2014-05-01 NOTE — Telephone Encounter (Signed)
Pt wife called in and wanted to know if Dr Tamala Julian will give him referral injection in his back

## 2014-05-01 NOTE — Telephone Encounter (Signed)
Epidural entered.

## 2014-05-05 ENCOUNTER — Ambulatory Visit
Admission: RE | Admit: 2014-05-05 | Discharge: 2014-05-05 | Disposition: A | Discharge status: Home / Self Care | Payer: 59 | Source: Ambulatory Visit | Attending: Family Medicine | Admitting: Family Medicine

## 2014-05-05 DIAGNOSIS — M5416 Radiculopathy, lumbar region: Secondary | ICD-10-CM

## 2014-05-05 MED ORDER — IOHEXOL 180 MG/ML  SOLN
1.0000 mL | Freq: Once | INTRAMUSCULAR | Status: AC | PRN
  Administered 2014-05-05: 1 mL via EPIDURAL

## 2014-05-05 MED ORDER — METHYLPREDNISOLONE ACETATE 40 MG/ML INJ SUSP (RADIOLOG
120.0000 mg | Freq: Once | INTRAMUSCULAR | Status: AC
  Administered 2014-05-05: 120 mg via EPIDURAL

## 2014-05-05 NOTE — Discharge Instructions (Signed)

## 2014-05-06 ENCOUNTER — Telehealth: Payer: Self-pay | Admitting: Internal Medicine

## 2014-05-06 NOTE — Telephone Encounter (Signed)
Pt wife called in said that he needs refill on his HYDROcodone Bitartrate ER [facility] ER) 40 MG T24A [[id number]]

## 2014-05-06 NOTE — Telephone Encounter (Signed)
Ok: he should have Rx for April and May 2016. Pt's drug screen is neg for Alprazolam: is he taking it? Thx

## 2014-05-06 NOTE — Telephone Encounter (Signed)
Pt's wife informed of below. I asked her if pt is taking Alprazolam. She states she has "no idea what he does/takes". I asked her to have him return my call.

## 2014-05-07 ENCOUNTER — Encounter: Payer: Self-pay | Admitting: Internal Medicine

## 2014-05-07 ENCOUNTER — Telehealth: Payer: Self-pay | Admitting: *Deleted

## 2014-05-07 NOTE — Telephone Encounter (Signed)
Noted. Thx.

## 2014-05-07 NOTE — Telephone Encounter (Signed)
Pt called back/see phone note dated 05/06/14- he states he takes Alprazolam only as needed. He also said he is taking Hysingla approximately QOD because they are strong and make him sleepy.

## 2014-05-21 ENCOUNTER — Encounter: Payer: Self-pay | Admitting: Internal Medicine

## 2014-05-22 ENCOUNTER — Telehealth: Payer: Self-pay | Admitting: Internal Medicine

## 2014-05-22 MED ORDER — NYSTATIN 500000 UNITS PO TABS: 500000.0000 [IU] | ORAL_TABLET | Freq: Three times a day (TID) | ORAL | Status: DC

## 2014-05-22 NOTE — Telephone Encounter (Signed)
Ok Done Thx 

## 2014-05-22 NOTE — Telephone Encounter (Signed)
Patient is experiencing thrush in mouth again. Asking for script to be called in to [facility] in [place].

## 2014-05-23 ENCOUNTER — Ambulatory Visit (INDEPENDENT_AMBULATORY_CARE_PROVIDER_SITE_OTHER): Payer: 59 | Admitting: Family Medicine

## 2014-05-23 ENCOUNTER — Encounter: Payer: Self-pay | Admitting: Family Medicine

## 2014-05-23 VITALS — BP 118/70 | HR 70 | Temp 97.7°F | Ht 67.0 in | Wt 156.5 lb

## 2014-05-23 DIAGNOSIS — L858 Other specified epidermal thickening: Secondary | ICD-10-CM | POA: Diagnosis not present

## 2014-05-23 DIAGNOSIS — K1321 Leukoplakia of oral mucosa, including tongue: Secondary | ICD-10-CM

## 2014-05-23 MED ORDER — MAGIC MOUTHWASH W/LIDOCAINE: 10.0000 mL | Freq: Four times a day (QID) | ORAL | Status: DC

## 2014-05-23 NOTE — Assessment & Plan Note (Signed)
differential is quite broad with patient being a smoker. Patient has had this intermittently. Patient has responded somewhat to acyclovir and as well as mouthwashes he states. I'm not convinced of any one diagnosis at this point. I think patient does need a biopsy. We will discuss where the referral should go. Patient given a numbing mouthwash at this time. We will see if this will be beneficial. In addition of this patient we'll watch for any fevers, chills, or any abnormal weight loss. Patient may need further laboratory workup but we will defer to primary care. Patient's will follow-up with him in the next 2 weeks.

## 2014-05-23 NOTE — Patient Instructions (Signed)
Good to see you I am glad the back is doing at least tolerable.  We will figure it out what the ulcers are in your mouth.  Use the mouthwash to help with the pain.  For your wife ask them to consider prednisone and diflucan.  See me when you need me.

## 2014-05-23 NOTE — Progress Notes (Signed)
Pre visit review using our clinic review tool, if applicable. No additional management support is needed unless otherwise documented below in the visit note. 

## 2014-05-23 NOTE — Progress Notes (Signed)
  [name] [address], [place] [address] Phone: [phone] Subjective:     CC: Painful tongue history of a thrush  [id number] Joseph Preston is a 64 y.o. male coming in with complaint of painful areas on his tongue. Patient has had this previously. Patient has been told before it is thrush.  Patient states that he usually needs a mouth wash of some sort and it seems to get better. Patient is also been treated with acyclovir previously by primary care provider and October 2015 when reviewing notes. Patient states that he has never had these biopsied. Patient denies any fevers or chills or any abnormal weight loss. Patient does have a past medical history no significant for being a smoker.     Past medical history, social, surgical and family history all reviewed in electronic medical record.   Review of Systems: No headache, visual changes, nausea, vomiting, diarrhea, constipation, dizziness, abdominal pain, skin rash, fevers, chills, night sweats, weight loss, swollen lymph nodes, body aches, joint swelling, muscle aches, chest pain, shortness of breath, mood changes.   Objective Blood pressure 118/70, pulse 70, temperature 97.7 F (36.5 C), temperature source Oral, height 5\' 7"  (1.702 m), weight 156 lb 8 oz (70.988 kg), SpO2 98 %.  General: No apparent distress alert and oriented x3 mood and affect normal, dressed appropriately.  HEENT: Patient's mouth exam shows the patient does have some white plaques mostly on the underneath aspect of the tongue as well as somewhat off the tip of the tongue. Patient is very tender. No blood noted of these. White confluence areas. Respiratory: Patient's speak in full sentences and does not appear short of breath  Cardiovascular: No lower extremity edema, non tender, no erythema  Skin: Warm dry intact with no signs of infection or rash on extremities or on axial skeleton.  Abdomen: Soft nontender  Neuro:  Cranial nerves II through XII are intact, neurovascularly intact in all extremities with 2+ DTRs and 2+ pulses.  Lymph: No lymphadenopathy of posterior or anterior cervical chain or axillae bilaterally.  Gait normal with good balance and coordination.  MSK:  Non tender with full range of motion and good stability and symmetric strength and tone of shoulders, elbows, wrist, hip, knee and ankles bilaterally.     Impression and Recommendations:     This case required medical decision making of moderate complexity.

## 2014-05-25 NOTE — Addendum Note (Signed)
Addended by: Lowella Dandy on: 05/25/2014 08:46 AM   Modules accepted: Orders

## 2014-05-25 NOTE — Telephone Encounter (Signed)
Called pt no answer LMOM md sent nystatin to pharmacy...Joseph Preston

## 2014-05-27 ENCOUNTER — Other Ambulatory Visit: Payer: Self-pay | Admitting: Family Medicine

## 2014-05-27 ENCOUNTER — Telehealth: Payer: Self-pay | Admitting: Family Medicine

## 2014-05-27 NOTE — Telephone Encounter (Signed)
Referral was entered. Pt will be contacted by brittany after she gets it scheduled.

## 2014-05-27 NOTE — Telephone Encounter (Signed)
States Dr. Tamala Julian prescribed something for fungus inside patients mouth. States before that Dr. Alain Marion had given him something else before then.  States this is not getting any better.  States Dr.Smith was suppose to let them know about a biopsy.  Requesting a call back as soon as possible.

## 2014-07-06 ENCOUNTER — Encounter: Payer: Self-pay | Admitting: Internal Medicine

## 2014-07-08 ENCOUNTER — Encounter: Payer: Self-pay | Admitting: Internal Medicine

## 2014-07-08 ENCOUNTER — Ambulatory Visit (INDEPENDENT_AMBULATORY_CARE_PROVIDER_SITE_OTHER): Payer: 59 | Admitting: Internal Medicine

## 2014-07-08 VITALS — BP 120/82 | HR 69 | Wt 162.0 lb

## 2014-07-08 DIAGNOSIS — F411 Generalized anxiety disorder: Secondary | ICD-10-CM | POA: Diagnosis not present

## 2014-07-08 DIAGNOSIS — M5440 Lumbago with sciatica, unspecified side: Secondary | ICD-10-CM

## 2014-07-08 MED ORDER — HYDROCODONE BITARTRATE ER 20 MG PO T24A: 20.0000 mg | EXTENDED_RELEASE_TABLET | Freq: Every day | ORAL | Status: DC

## 2014-07-08 NOTE — Progress Notes (Signed)
   Subjective:    Patient ID: Joseph Preston, male    DOB: 1950-02-05, 64 y.o.   MRN: [id number]  HPI   The patient is here to address  chronic LBP and LLE pain - mild on current rx. Pt would like to get a lower dose of Hysingla.  F/u rash in the mouth x 12 months - painful sores - resolved F/u diarrhea off and on x 6 mo - much better milk-free F/u elev PSA - s/p bx  Wt Readings from Last 3 Encounters:  07/08/14 162 lb (73.483 kg)  05/23/14 156 lb 8 oz (70.988 kg)  04/07/14 152 lb 8 oz (69.174 kg)   BP Readings from Last 3 Encounters:  07/08/14 120/82  05/23/14 118/70  05/05/14 132/80      Review of Systems  Constitutional: Negative for appetite change, fatigue and unexpected weight change.  HENT: Negative for congestion, nosebleeds, sneezing, sore throat and trouble swallowing.   Eyes: Negative for itching and visual disturbance.  Respiratory: Negative for cough.   Cardiovascular: Negative for chest pain, palpitations and leg swelling.  Gastrointestinal: Positive for constipation. Negative for nausea, abdominal pain, diarrhea, blood in stool and abdominal distention.  Genitourinary: Negative for urgency, frequency and hematuria.  Musculoskeletal: Negative for back pain, joint swelling, gait problem and neck pain.  Skin: Negative for rash.  Neurological: Negative for dizziness, tremors, speech difficulty, weakness and headaches.  Psychiatric/Behavioral: Negative for suicidal ideas, sleep disturbance, dysphoric mood and agitation. The patient is nervous/anxious.        Objective:   Physical Exam  Constitutional: He is oriented to person, place, and time. He appears well-developed. No distress.  NAD  HENT:  Mouth/Throat: Oropharynx is clear and moist.  Eyes: Conjunctivae are normal. Pupils are equal, round, and reactive to light.  Neck: Normal range of motion. No JVD present. No thyromegaly present.  Cardiovascular: Normal rate, regular rhythm, normal heart sounds and  intact distal pulses.  Exam reveals no gallop and no friction rub.   No murmur heard. Pulmonary/Chest: Effort normal and breath sounds normal. No respiratory distress. He has no wheezes. He has no rales. He exhibits no tenderness.  Abdominal: Soft. Bowel sounds are normal. He exhibits no distension and no mass. There is no tenderness. There is no rebound and no guarding.  Musculoskeletal: Normal range of motion. He exhibits tenderness. He exhibits no edema.  Lymphadenopathy:    He has no cervical adenopathy.  Neurological: He is alert and oriented to person, place, and time. He has normal reflexes. No cranial nerve deficit. He exhibits normal muscle tone. He displays a negative Romberg sign. Coordination and gait normal.  Skin: Skin is warm and dry. No rash noted.  Psychiatric: He has a normal mood and affect. His behavior is normal. Judgment and thought content normal.  LS spine is less tender  Lab Results  Component Value Date   WBC 6.6 06/02/2013   HGB 14.1 06/02/2013   HCT 42.0 06/02/2013   PLT 221.0 06/02/2013   GLUCOSE 94 06/02/2013   ALT 36 06/02/2013   AST 29 06/02/2013   NA 138 06/02/2013   K 4.5 06/02/2013   CL 102 06/02/2013   CREATININE 1.0 06/02/2013   BUN 16 06/02/2013   CO2 29 06/02/2013   TSH 1.06 06/02/2013   PSA 8.63* 08/12/2013       Assessment & Plan:  Patient ID: Joseph Preston, male   DOB: May 11, 1950, 64 y.o.   MRN: [id number]

## 2014-07-08 NOTE — Assessment & Plan Note (Addendum)
Chronic  On Hysingla - will reduce the dose due to a HA Potential benefits of a long term opioids use as well as potential risks (i.e. addiction risk, apnea etc) and complications (i.e. Somnolence, constipation and others) were explained to the patient and were aknowledged.

## 2014-07-08 NOTE — Assessment & Plan Note (Signed)
Chronic   Potential benefits of a long term benzodiazepines  use as well as potential risks  and complications were explained to the patient and were aknowledged. UDS (-) Xanax: no Rx given

## 2014-07-08 NOTE — Progress Notes (Signed)
Pre visit review using our clinic review tool, if applicable. No additional management support is needed unless otherwise documented below in the visit note. 

## 2014-07-10 ENCOUNTER — Ambulatory Visit: Payer: 59 | Admitting: Urology

## 2014-07-31 ENCOUNTER — Telehealth: Payer: Self-pay | Admitting: Internal Medicine

## 2014-07-31 MED ORDER — CYCLOBENZAPRINE HCL ER 15 MG PO CP24: 15.0000 mg | ORAL_CAPSULE | Freq: Every day | ORAL | Status: DC | PRN

## 2014-07-31 NOTE — Telephone Encounter (Signed)
Patient is out of amrix.  States he does not have a discount card and is requesting something in place to be sent to [facility] in [place].

## 2014-07-31 NOTE — Telephone Encounter (Signed)
Pls mail Rx and card Thx

## 2014-08-04 NOTE — Telephone Encounter (Signed)
Rx and card mailed to pt.

## 2014-08-26 ENCOUNTER — Other Ambulatory Visit: Payer: Self-pay | Admitting: Internal Medicine

## 2014-09-09 ENCOUNTER — Other Ambulatory Visit: Payer: Self-pay | Admitting: Internal Medicine

## 2014-09-15 ENCOUNTER — Telehealth: Payer: Self-pay | Admitting: Internal Medicine

## 2014-09-15 NOTE — Telephone Encounter (Signed)
Pt requesting discount card he usually gets for Amrix, so he can pick it up Please advise

## 2014-09-15 NOTE — Telephone Encounter (Signed)
Amrix Savings card upfront. Pt's wife informed.

## 2014-10-06 ENCOUNTER — Ambulatory Visit (INDEPENDENT_AMBULATORY_CARE_PROVIDER_SITE_OTHER): Payer: 59 | Admitting: Internal Medicine

## 2014-10-06 ENCOUNTER — Encounter: Payer: Self-pay | Admitting: Internal Medicine

## 2014-10-06 VITALS — BP 138/84 | HR 86 | Wt 162.0 lb

## 2014-10-06 DIAGNOSIS — K029 Dental caries, unspecified: Secondary | ICD-10-CM

## 2014-10-06 DIAGNOSIS — F411 Generalized anxiety disorder: Secondary | ICD-10-CM

## 2014-10-06 DIAGNOSIS — M5416 Radiculopathy, lumbar region: Secondary | ICD-10-CM

## 2014-10-06 DIAGNOSIS — R972 Elevated prostate specific antigen [PSA]: Secondary | ICD-10-CM

## 2014-10-06 DIAGNOSIS — K1379 Other lesions of oral mucosa: Secondary | ICD-10-CM | POA: Diagnosis not present

## 2014-10-06 MED ORDER — TRIAMCINOLONE ACETONIDE 0.1 % MT PSTE: 1.0000 "application " | PASTE | Freq: Three times a day (TID) | OROMUCOSAL | Status: DC

## 2014-10-06 MED ORDER — GABAPENTIN 100 MG PO CAPS: 100.0000 mg | ORAL_CAPSULE | Freq: Every day | ORAL | Status: DC

## 2014-10-06 MED ORDER — ALIGN 4 MG PO CAPS: 1.0000 | ORAL_CAPSULE | Freq: Every day | ORAL | Status: DC

## 2014-10-06 NOTE — Patient Instructions (Signed)
Have bottom teeth removed

## 2014-10-06 NOTE — Assessment & Plan Note (Addendum)
Triam/orabase prn F/u w/ENT for bx Have bottom teeth removed Stop using tonic water and see if mouth ulcers disappear

## 2014-10-06 NOTE — Progress Notes (Signed)
Subjective:  Patient ID: Joseph Preston, male    DOB: [birth year]  Age: 64 y.o. MRN: [id number]  CC: No chief complaint on file.   HPI COEN MIYASATO presents for LBP, leg pain f/u. He had epidural inj x2 this year. Pt has a sore in the mouth - he saw ENT.  Outpatient Prescriptions Prior to Visit  Medication Sig Dispense Refill  . ALPRAZolam (XANAX) 0.25 MG tablet Take 1 tablet (0.25 mg total) by mouth 2 (two) times daily as needed for anxiety. 90 tablet 1  . Alum & Mag Hydroxide-Simeth (MAGIC MOUTHWASH W/LIDOCAINE) SOLN Take 10 mLs by mouth 4 (four) times daily. Swish for 2 minutes then spit out. 500 mL 1  . cyclobenzaprine (AMRIX) 15 MG 24 hr capsule Take 1 capsule (15 mg total) by mouth daily as needed for muscle spasms. 30 capsule 3  . cyclobenzaprine (FLEXERIL) 10 MG tablet TAKE ONE TABLET BY MOUTH THREE TIMES DAILY AS NEEDED FOR MUSCLE SPASM 30 tablet 1  . HYDROcodone Bitartrate ER (HYSINGLA ER) 20 MG T24A Take 20 mg by mouth daily. 30 each 0  . hyoscyamine (LEVSIN, ANASPAZ) 0.125 MG tablet TAKE ONE TO TWO TABLETS BY MOUTH TWICE DAILY AS NEEDED FOR SPASMS/IBS 100 tablet 0  . nystatin (MYCOSTATIN) 500000 UNITS TABS tablet Take 1 tablet (500,000 Units total) by mouth 3 (three) times daily. 30 tablet 0  . vardenafil (LEVITRA) 20 MG tablet Take 1 tablet (20 mg total) by mouth daily as needed for erectile dysfunction. 20 tablet 6  . gabapentin (NEURONTIN) 100 MG capsule Take 1 capsule (100 mg total) by mouth at bedtime. 30 capsule 3  . triamcinolone (KENALOG) 0.1 % paste Use as directed 1 application in the mouth or throat 3 (three) times daily. Mouth ulcer 5 g 1   No facility-administered medications prior to visit.    ROS Review of Systems  Constitutional: Negative for appetite change, fatigue and unexpected weight change.  HENT: Negative for congestion, nosebleeds, sneezing, sore throat and trouble swallowing.   Eyes: Negative for itching and visual disturbance.  Respiratory:  Negative for cough.   Cardiovascular: Negative for chest pain, palpitations and leg swelling.  Gastrointestinal: Negative for nausea, diarrhea, blood in stool and abdominal distention.  Genitourinary: Negative for frequency and hematuria.  Musculoskeletal: Positive for back pain and arthralgias. Negative for joint swelling, gait problem and neck pain.  Skin: Negative for rash.  Neurological: Negative for dizziness, tremors, speech difficulty and weakness.  Psychiatric/Behavioral: Negative for suicidal ideas, sleep disturbance, dysphoric mood and agitation. The patient is nervous/anxious.     Objective:  BP 138/84 mmHg  Pulse 86  Wt 162 lb (73.483 kg)  SpO2 97%  BP Readings from Last 3 Encounters:  10/06/14 138/84  07/08/14 120/82  05/23/14 118/70    Wt Readings from Last 3 Encounters:  10/06/14 162 lb (73.483 kg)  07/08/14 162 lb (73.483 kg)  05/23/14 156 lb 8 oz (70.988 kg)    Physical Exam  Constitutional: He is oriented to person, place, and time. He appears well-developed. No distress.  NAD  Eyes: Conjunctivae are normal. Pupils are equal, round, and reactive to light.  Neck: Normal range of motion. No JVD present. No thyromegaly present.  Cardiovascular: Normal rate, regular rhythm, normal heart sounds and intact distal pulses.  Exam reveals no gallop and no friction rub.   No murmur heard. Pulmonary/Chest: Effort normal and breath sounds normal. No respiratory distress. He has no wheezes. He has no rales. He exhibits no  tenderness.  Abdominal: Soft. Bowel sounds are normal. He exhibits no distension and no mass. There is no tenderness. There is no rebound and no guarding.  Musculoskeletal: Normal range of motion. He exhibits no edema or tenderness.  Lymphadenopathy:    He has no cervical adenopathy.  Neurological: He is alert and oriented to person, place, and time. He has normal reflexes. No cranial nerve deficit. He exhibits normal muscle tone. He displays a negative  Romberg sign. Coordination and gait normal.  Skin: Skin is warm and dry. No rash noted.  Psychiatric: He has a normal mood and affect. His behavior is normal. Judgment and thought content normal.  L 5mm tongue ulcer Brown tongue coating 7 lower teeth w/caries  Lab Results  Component Value Date   WBC 6.6 06/02/2013   HGB 14.1 06/02/2013   HCT 42.0 06/02/2013   PLT 221.0 06/02/2013   GLUCOSE 94 06/02/2013   ALT 36 06/02/2013   AST 29 06/02/2013   NA 138 06/02/2013   K 4.5 06/02/2013   CL 102 06/02/2013   CREATININE 1.0 06/02/2013   BUN 16 06/02/2013   CO2 29 06/02/2013   TSH 1.06 06/02/2013   PSA 8.63* 08/12/2013    Dg Epidurography  05/05/2014   CLINICAL DATA:  Lumbosacral spondylosis without myelopathy. 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 Left lower extremity radiculitis. The patient had significant relief of pain from prior interlaminar epidural steroid injection at L5-S1 and a left L5 nerve root injection.  FLUOROSCOPY TIME:  Dictate in minutes and seconds  PROCEDURE: The procedure, risks, benefits, and alternatives were explained to the patient. Questions regarding the procedure were encouraged and answered. The patient understands and consents to the procedure.  LUMBAR EPIDURAL INJECTION:  An interlaminar approach was performed on left at L4-5. The overlying skin was cleansed and anesthetized. A 20 gauge Crawford epidural needle was advanced using loss-of-resistance technique.  DIAGNOSTIC EPIDURAL INJECTION:  Injection of Omnipaque 180 shows a good epidural pattern with spread above and below the level of needle placement, primarily on the left no vascular opacification is seen.  THERAPEUTIC EPIDURAL INJECTION:  120 Mg of Depo-Medrol mixed with 3 mL 1% lidocaine were instilled. The procedure was well-tolerated, and the patient was discharged thirty minutes following the injection in good condition.  COMPLICATIONS: None  IMPRESSION: Technically successful epidural injection on the left L4-5 #  1   Electronically Signed   By: San Morelle M.D.   On: 05/05/2014 14:34    Assessment & Plan:   Diagnoses and all orders for this visit:  Lumbar radiculopathy, chronic  Anxiety state  Elevated PSA  Mouth sore  Dental caries extending into pulp  Other orders -     gabapentin (NEURONTIN) 100 MG capsule; Take 1 capsule (100 mg total) by mouth at bedtime. -     triamcinolone (KENALOG) 0.1 % paste; Use as directed 1 application in the mouth or throat 3 (three) times daily. Mouth ulcer -     Probiotic Product (ALIGN) 4 MG CAPS; Take 1 capsule by mouth daily.   I am having Mr. Kirsh start on ALIGN. I am also having him maintain his vardenafil, ALPRAZolam, nystatin, magic mouthwash w/lidocaine, HYDROcodone Bitartrate ER, cyclobenzaprine, hyoscyamine, cyclobenzaprine, gabapentin, and triamcinolone.  Meds ordered this encounter  Medications  . gabapentin (NEURONTIN) 100 MG capsule    Sig: Take 1 capsule (100 mg total) by mouth at bedtime.    Dispense:  90 capsule    Refill:  3  . triamcinolone (KENALOG) 0.1 %  paste    Sig: Use as directed 1 application in the mouth or throat 3 (three) times daily. Mouth ulcer    Dispense:  5 g    Refill:  2  . Probiotic Product (ALIGN) 4 MG CAPS    Sig: Take 1 capsule by mouth daily.    Dispense:  30 capsule    Refill:  3     Follow-up: Return in about 3 months (around 01/05/2015) for a follow-up visit.  Walker Kehr, MD

## 2014-10-06 NOTE — Assessment & Plan Note (Signed)
Have bottom teeth removed

## 2014-10-06 NOTE — Assessment & Plan Note (Signed)
Doing ok.

## 2014-10-06 NOTE — Assessment & Plan Note (Signed)
F/u w/Dr Wrenn  

## 2014-10-06 NOTE — Assessment & Plan Note (Signed)
Gabapentin renewed  Taking Amrix

## 2014-10-06 NOTE — Progress Notes (Signed)
Pre visit review using our clinic review tool, if applicable. No additional management support is needed unless otherwise documented below in the visit note. 

## 2014-10-20 ENCOUNTER — Telehealth: Payer: Self-pay | Admitting: Family Medicine

## 2014-10-20 DIAGNOSIS — M5416 Radiculopathy, lumbar region: Secondary | ICD-10-CM

## 2014-10-20 NOTE — Telephone Encounter (Signed)
pts wife called regarding pt needing to get an epidural

## 2014-10-20 NOTE — Telephone Encounter (Signed)
Order entered, pt made aware.  

## 2014-10-27 ENCOUNTER — Ambulatory Visit
Admission: RE | Admit: 2014-10-27 | Discharge: 2014-10-27 | Disposition: A | Discharge status: Home / Self Care | Payer: 59 | Source: Ambulatory Visit | Attending: Family Medicine | Admitting: Family Medicine

## 2014-10-27 DIAGNOSIS — M5416 Radiculopathy, lumbar region: Secondary | ICD-10-CM

## 2014-10-27 MED ORDER — IOHEXOL 180 MG/ML  SOLN
1.0000 mL | Freq: Once | INTRAMUSCULAR | Status: DC | PRN
  Administered 2014-10-27: 1 mL via EPIDURAL

## 2014-10-27 MED ORDER — METHYLPREDNISOLONE ACETATE 40 MG/ML INJ SUSP (RADIOLOG
120.0000 mg | Freq: Once | INTRAMUSCULAR | Status: AC
  Administered 2014-10-27: 120 mg via EPIDURAL

## 2014-10-27 NOTE — Discharge Instructions (Signed)

## 2014-12-01 ENCOUNTER — Telehealth: Payer: Self-pay | Admitting: Internal Medicine

## 2014-12-01 NOTE — Telephone Encounter (Signed)
Patient's wife would like to know if they can get a coupon for [facility] meds

## 2014-12-02 MED ORDER — CYCLOBENZAPRINE HCL ER 15 MG PO CP24: 15.0000 mg | ORAL_CAPSULE | Freq: Every day | ORAL | Status: DC | PRN

## 2014-12-02 NOTE — Telephone Encounter (Signed)
Notified pt wife rx/coupon ready for pick-up...Joseph Preston

## 2014-12-02 NOTE — Telephone Encounter (Signed)
Is it Amrix? OK

## 2014-12-07 ENCOUNTER — Other Ambulatory Visit: Payer: Self-pay | Admitting: Internal Medicine

## 2014-12-09 NOTE — Telephone Encounter (Signed)
Done

## 2015-01-05 ENCOUNTER — Ambulatory Visit (INDEPENDENT_AMBULATORY_CARE_PROVIDER_SITE_OTHER): Payer: 59 | Admitting: Internal Medicine

## 2015-01-05 ENCOUNTER — Encounter: Payer: Self-pay | Admitting: Internal Medicine

## 2015-01-05 VITALS — BP 124/80 | HR 85 | Wt 167.0 lb

## 2015-01-05 DIAGNOSIS — K1379 Other lesions of oral mucosa: Secondary | ICD-10-CM

## 2015-01-05 DIAGNOSIS — M5416 Radiculopathy, lumbar region: Secondary | ICD-10-CM | POA: Diagnosis not present

## 2015-01-05 DIAGNOSIS — K029 Dental caries, unspecified: Secondary | ICD-10-CM | POA: Diagnosis not present

## 2015-01-05 DIAGNOSIS — Z23 Encounter for immunization: Secondary | ICD-10-CM

## 2015-01-05 DIAGNOSIS — F411 Generalized anxiety disorder: Secondary | ICD-10-CM

## 2015-01-05 NOTE — Assessment & Plan Note (Addendum)
Amrix 15 mg qhs, Gabapentin NS consult Dr Hal Neer - had epidural injections

## 2015-01-05 NOTE — Addendum Note (Signed)
Addended by: Cresenciano Lick on: 01/05/2015 03:39 PM   Modules accepted: Orders

## 2015-01-05 NOTE — Assessment & Plan Note (Signed)
Chronic   Potential benefits of a long term benzodiazepines  use as well as potential risks  and complications were explained to the patient and were aknowledged. UDS (-) Xanax

## 2015-01-05 NOTE — Assessment & Plan Note (Signed)
Pt is planning to see a dentist

## 2015-01-05 NOTE — Assessment & Plan Note (Signed)
Doing well 

## 2015-01-05 NOTE — Progress Notes (Signed)
Pre visit review using our clinic review tool, if applicable. No additional management support is needed unless otherwise documented below in the visit note. 

## 2015-01-05 NOTE — Progress Notes (Signed)
Subjective:  Patient ID: Joseph Preston, male    DOB: [birth year]  Age: 64 y.o. MRN: [id number]  CC: No chief complaint on file.   HPI Joseph Preston presents for sciatica off and on, OA, anxiety f/u  Outpatient Prescriptions Prior to Visit  Medication Sig Dispense Refill  . ALPRAZolam (XANAX) 0.25 MG tablet TAKE ONE TABLET BY MOUTH TWICE DAILY AS NEEDED FOR ANXIETY 90 tablet 1  . Alum & Mag Hydroxide-Simeth (MAGIC MOUTHWASH W/LIDOCAINE) SOLN Take 10 mLs by mouth 4 (four) times daily. Swish for 2 minutes then spit out. 500 mL 1  . cyclobenzaprine (AMRIX) 15 MG 24 hr capsule Take 1 capsule (15 mg total) by mouth daily as needed for muscle spasms. 30 capsule 3  . gabapentin (NEURONTIN) 100 MG capsule Take 1 capsule (100 mg total) by mouth at bedtime. 90 capsule 3  . hyoscyamine (LEVSIN, ANASPAZ) 0.125 MG tablet TAKE ONE TO TWO TABLETS BY MOUTH TWICE DAILY AS NEEDED FOR SPASMS/IBS 100 tablet 0  . nystatin (MYCOSTATIN) 500000 UNITS TABS tablet Take 1 tablet (500,000 Units total) by mouth 3 (three) times daily. 30 tablet 0  . Probiotic Product (ALIGN) 4 MG CAPS Take 1 capsule by mouth daily. 30 capsule 3  . triamcinolone (KENALOG) 0.1 % paste Use as directed 1 application in the mouth or throat 3 (three) times daily. Mouth ulcer 5 g 2  . vardenafil (LEVITRA) 20 MG tablet Take 1 tablet (20 mg total) by mouth daily as needed for erectile dysfunction. 20 tablet 6  . HYDROcodone Bitartrate ER (HYSINGLA ER) 20 MG T24A Take 20 mg by mouth daily. (Patient not taking: Reported on 01/05/2015) 30 each 0   No facility-administered medications prior to visit.    ROS Review of Systems  Constitutional: Negative for appetite change, fatigue and unexpected weight change.  HENT: Negative for congestion, nosebleeds, sneezing, sore throat and trouble swallowing.   Eyes: Negative for itching and visual disturbance.  Respiratory: Negative for cough.   Cardiovascular: Negative for chest pain, palpitations  and leg swelling.  Gastrointestinal: Negative for nausea, diarrhea, blood in stool and abdominal distention.  Genitourinary: Negative for frequency and hematuria.  Musculoskeletal: Positive for back pain. Negative for joint swelling, gait problem and neck pain.  Skin: Negative for rash.  Neurological: Negative for dizziness, tremors, speech difficulty and weakness.  Psychiatric/Behavioral: Negative for sleep disturbance, dysphoric mood and agitation. The patient is nervous/anxious.     Objective:  BP 124/80 mmHg  Pulse 85  Wt 167 lb (75.751 kg)  SpO2 97%  BP Readings from Last 3 Encounters:  01/05/15 124/80  10/27/14 140/79  10/06/14 138/84    Wt Readings from Last 3 Encounters:  01/05/15 167 lb (75.751 kg)  10/06/14 162 lb (73.483 kg)  07/08/14 162 lb (73.483 kg)    Physical Exam  Constitutional: He is oriented to person, place, and time. He appears well-developed. No distress.  NAD  HENT:  Mouth/Throat: Oropharynx is clear and moist.  Eyes: Conjunctivae are normal. Pupils are equal, round, and reactive to light.  Neck: Normal range of motion. No JVD present. No thyromegaly present.  Cardiovascular: Normal rate, regular rhythm, normal heart sounds and intact distal pulses.  Exam reveals no gallop and no friction rub.   No murmur heard. Pulmonary/Chest: Effort normal and breath sounds normal. No respiratory distress. He has no wheezes. He has no rales. He exhibits no tenderness.  Abdominal: Soft. Bowel sounds are normal. He exhibits no distension and no mass. There is  no tenderness. There is no rebound and no guarding.  Musculoskeletal: Normal range of motion. He exhibits tenderness. He exhibits no edema.  Lymphadenopathy:    He has no cervical adenopathy.  Neurological: He is alert and oriented to person, place, and time. He has normal reflexes. No cranial nerve deficit. He exhibits normal muscle tone. He displays a negative Romberg sign. Coordination and gait normal.    Skin: Skin is warm and dry. No rash noted.  Psychiatric: He has a normal mood and affect. His behavior is normal. Judgment and thought content normal.    Lab Results  Component Value Date   WBC 6.6 06/02/2013   HGB 14.1 06/02/2013   HCT 42.0 06/02/2013   PLT 221.0 06/02/2013   GLUCOSE 94 06/02/2013   ALT 36 06/02/2013   AST 29 06/02/2013   NA 138 06/02/2013   K 4.5 06/02/2013   CL 102 06/02/2013   CREATININE 1.0 06/02/2013   BUN 16 06/02/2013   CO2 29 06/02/2013   TSH 1.06 06/02/2013   PSA 8.63* 08/12/2013    Dg Epidurography  10/27/2014  CLINICAL DATA:  Lumbar radiculopathy. Left lower extremity radiculopathy. Displacement of the L4-5 lumbar disc. FLUOROSCOPY TIME:  0.0647 Gy*cm2 PROCEDURE: The procedure, risks, benefits, and alternatives were explained to the patient. Questions regarding the procedure were encouraged and answered. The patient understands and consents to the procedure. LUMBAR EPIDURAL INJECTION: An interlaminar approach was performed on the left at L4-5. The overlying skin was cleansed and anesthetized. A 20 gauge Crawford epidural needle was advanced using loss-of-resistance technique. DIAGNOSTIC EPIDURAL INJECTION: Injection of Omnipaque 180 shows a good epidural pattern with spread above and below the level of needle placement, primarily on the left no vascular opacification is seen. THERAPEUTIC EPIDURAL INJECTION: 120 Mg of Depo-Medrol mixed with 3 mL 1% lidocaine were instilled. The procedure was well-tolerated, and the patient was discharged thirty minutes following the injection in good condition. COMPLICATIONS: None IMPRESSION: Technically successful epidural injection on the left L4-5 # 1 Electronically Signed   By: San Morelle M.D.   On: 10/27/2014 14:15    Assessment & Plan:   There are no diagnoses linked to this encounter. I am having Joseph Preston maintain his vardenafil, nystatin, magic mouthwash w/lidocaine, HYDROcodone Bitartrate ER,  hyoscyamine, gabapentin, triamcinolone, ALIGN, cyclobenzaprine, and ALPRAZolam.  No orders of the defined types were placed in this encounter.     Follow-up: No Follow-up on file.  Walker Kehr, MD

## 2015-02-17 ENCOUNTER — Telehealth: Payer: Self-pay | Admitting: *Deleted

## 2015-02-17 MED ORDER — CYCLOBENZAPRINE HCL ER 15 MG PO CP24: 15.0000 mg | ORAL_CAPSULE | Freq: Every day | ORAL | Status: DC | PRN

## 2015-02-17 NOTE — Telephone Encounter (Signed)
OK OK to fill this prescription with additional refills x3 Thank you!

## 2015-02-17 NOTE — Telephone Encounter (Signed)
Wife called requesting a refill on pt Amirix, also want coupon of rx mail to their address...Johny Chess

## 2015-02-17 NOTE — Telephone Encounter (Signed)
Inform wife no coupons available, but can go to website to print off. Refill sent already....Johny Chess

## 2015-02-25 ENCOUNTER — Ambulatory Visit: Payer: 59 | Admitting: Family Medicine

## 2015-03-31 ENCOUNTER — Other Ambulatory Visit: Payer: Self-pay | Admitting: Internal Medicine

## 2015-04-01 NOTE — Telephone Encounter (Signed)
Called refill into [facility] had to leave on pharmacy vm.../lmb 

## 2015-04-30 ENCOUNTER — Ambulatory Visit (INDEPENDENT_AMBULATORY_CARE_PROVIDER_SITE_OTHER): Payer: 59 | Admitting: Urology

## 2015-04-30 DIAGNOSIS — N5201 Erectile dysfunction due to arterial insufficiency: Secondary | ICD-10-CM | POA: Diagnosis not present

## 2015-04-30 DIAGNOSIS — R972 Elevated prostate specific antigen [PSA]: Secondary | ICD-10-CM | POA: Diagnosis not present

## 2015-04-30 DIAGNOSIS — N401 Enlarged prostate with lower urinary tract symptoms: Secondary | ICD-10-CM | POA: Diagnosis not present

## 2015-04-30 DIAGNOSIS — N411 Chronic prostatitis: Secondary | ICD-10-CM

## 2015-05-06 ENCOUNTER — Other Ambulatory Visit: Payer: Self-pay | Admitting: *Deleted

## 2015-05-06 DIAGNOSIS — M5416 Radiculopathy, lumbar region: Secondary | ICD-10-CM

## 2015-05-07 ENCOUNTER — Ambulatory Visit (INDEPENDENT_AMBULATORY_CARE_PROVIDER_SITE_OTHER): Payer: 59 | Admitting: Internal Medicine

## 2015-05-07 ENCOUNTER — Encounter: Payer: Self-pay | Admitting: Internal Medicine

## 2015-05-07 VITALS — BP 100/68 | HR 73 | Temp 97.3°F | Ht 67.0 in

## 2015-05-07 DIAGNOSIS — R972 Elevated prostate specific antigen [PSA]: Secondary | ICD-10-CM

## 2015-05-07 DIAGNOSIS — M5416 Radiculopathy, lumbar region: Secondary | ICD-10-CM | POA: Diagnosis not present

## 2015-05-07 DIAGNOSIS — Z0000 Encounter for general adult medical examination without abnormal findings: Secondary | ICD-10-CM

## 2015-05-07 DIAGNOSIS — F411 Generalized anxiety disorder: Secondary | ICD-10-CM | POA: Diagnosis not present

## 2015-05-07 DIAGNOSIS — R202 Paresthesia of skin: Secondary | ICD-10-CM

## 2015-05-07 MED ORDER — ALPRAZOLAM 0.25 MG PO TABS: 0.2500 mg | ORAL_TABLET | Freq: Two times a day (BID) | ORAL | Status: DC | PRN

## 2015-05-07 MED ORDER — PREGABALIN 75 MG PO CAPS: 75.0000 mg | ORAL_CAPSULE | Freq: Two times a day (BID) | ORAL | Status: DC

## 2015-05-07 NOTE — Progress Notes (Signed)
Subjective:  Patient ID: Joseph Preston, male    DOB: [birth year]  Age: 65 y.o. MRN: [id number]  CC: No chief complaint on file.   HPI BIRT BIEDRZYCKI presents for anxiety, LBP, COPD f/u. Wife is c/o pt's irritability    Outpatient Prescriptions Prior to Visit  Medication Sig Dispense Refill  . ALPRAZolam (XANAX) 0.25 MG tablet TAKE ONE TABLET BY MOUTH TWICE DAILY AS NEEDED FOR ANXIETY 90 tablet 2  . cyclobenzaprine (AMRIX) 15 MG 24 hr capsule Take 1 capsule (15 mg total) by mouth daily as needed for muscle spasms. 30 capsule 3  . gabapentin (NEURONTIN) 100 MG capsule Take 1 capsule (100 mg total) by mouth at bedtime. 90 capsule 3  . hyoscyamine (LEVSIN, ANASPAZ) 0.125 MG tablet TAKE ONE TO TWO TABLETS BY MOUTH TWICE DAILY AS NEEDED FOR SPASMS/IBS 100 tablet 0  . Probiotic Product (ALIGN) 4 MG CAPS Take 1 capsule by mouth daily. 30 capsule 3  . triamcinolone (KENALOG) 0.1 % paste Use as directed 1 application in the mouth or throat 3 (three) times daily. Mouth ulcer 5 g 2  . vardenafil (LEVITRA) 20 MG tablet Take 1 tablet (20 mg total) by mouth daily as needed for erectile dysfunction. 20 tablet 6   No facility-administered medications prior to visit.    ROS Review of Systems  Constitutional: Negative for appetite change, fatigue and unexpected weight change.  HENT: Negative for congestion, nosebleeds, sneezing, sore throat and trouble swallowing.   Eyes: Negative for itching and visual disturbance.  Respiratory: Negative for cough.   Cardiovascular: Negative for chest pain, palpitations and leg swelling.  Gastrointestinal: Negative for nausea, diarrhea, blood in stool and abdominal distention.  Genitourinary: Negative for frequency and hematuria.  Musculoskeletal: Positive for back pain, arthralgias and neck pain. Negative for joint swelling and gait problem.  Skin: Negative for rash.  Neurological: Negative for dizziness, tremors, speech difficulty and weakness.    Psychiatric/Behavioral: Negative for suicidal ideas, sleep disturbance, self-injury, dysphoric mood and agitation. The patient is nervous/anxious.   not homicidal  Objective:  BP 100/68 mmHg  Pulse 73  Temp(Src) 97.3 F (36.3 C) (Oral)  Ht 5\' 7"  (1.702 m)  SpO2 97%  BP Readings from Last 3 Encounters:  05/07/15 100/68  01/05/15 124/80  10/27/14 140/79    Wt Readings from Last 3 Encounters:  01/05/15 167 lb (75.751 kg)  10/06/14 162 lb (73.483 kg)  07/08/14 162 lb (73.483 kg)    Physical Exam  Constitutional: He is oriented to person, place, and time. He appears well-developed. No distress.  NAD  HENT:  Mouth/Throat: Oropharynx is clear and moist.  Eyes: Conjunctivae are normal. Pupils are equal, round, and reactive to light.  Neck: Normal range of motion. No JVD present. No thyromegaly present.  Cardiovascular: Normal rate, regular rhythm, normal heart sounds and intact distal pulses.  Exam reveals no gallop and no friction rub.   No murmur heard. Pulmonary/Chest: Effort normal and breath sounds normal. No respiratory distress. He has no wheezes. He has no rales. He exhibits no tenderness.  Abdominal: Soft. Bowel sounds are normal. He exhibits no distension and no mass. There is no tenderness. There is no rebound and no guarding.  Musculoskeletal: Normal range of motion. He exhibits tenderness. He exhibits no edema.  Lymphadenopathy:    He has no cervical adenopathy.  Neurological: He is alert and oriented to person, place, and time. He has normal reflexes. No cranial nerve deficit. He exhibits normal muscle tone. He displays  a negative Romberg sign. Coordination and gait normal.  Skin: Skin is warm and dry. No rash noted.  Psychiatric: Judgment and thought content normal.    Lab Results  Component Value Date   WBC 6.6 06/02/2013   HGB 14.1 06/02/2013   HCT 42.0 06/02/2013   PLT 221.0 06/02/2013   GLUCOSE 94 06/02/2013   ALT 36 06/02/2013   AST 29 06/02/2013   NA  138 06/02/2013   K 4.5 06/02/2013   CL 102 06/02/2013   CREATININE 1.0 06/02/2013   BUN 16 06/02/2013   CO2 29 06/02/2013   TSH 1.06 06/02/2013   PSA 8.63* 08/12/2013    Dg Epidurography  10/27/2014  CLINICAL DATA:  Lumbar radiculopathy. Left lower extremity radiculopathy. Displacement of the L4-5 lumbar disc. FLUOROSCOPY TIME:  0.0647 Gy*cm2 PROCEDURE: The procedure, risks, benefits, and alternatives were explained to the patient. Questions regarding the procedure were encouraged and answered. The patient understands and consents to the procedure. LUMBAR EPIDURAL INJECTION: An interlaminar approach was performed on the left at L4-5. The overlying skin was cleansed and anesthetized. A 20 gauge Crawford epidural needle was advanced using loss-of-resistance technique. DIAGNOSTIC EPIDURAL INJECTION: Injection of Omnipaque 180 shows a good epidural pattern with spread above and below the level of needle placement, primarily on the left no vascular opacification is seen. THERAPEUTIC EPIDURAL INJECTION: 120 Mg of Depo-Medrol mixed with 3 mL 1% lidocaine were instilled. The procedure was well-tolerated, and the patient was discharged thirty minutes following the injection in good condition. COMPLICATIONS: None IMPRESSION: Technically successful epidural injection on the left L4-5 # 1 Electronically Signed   By: San Morelle M.D.   On: 10/27/2014 14:15    Assessment & Plan:   Diagnoses and all orders for this visit:  Generalized anxiety disorder  I am having Mr. Rachow maintain his vardenafil, hyoscyamine, gabapentin, triamcinolone, ALIGN, cyclobenzaprine, and ALPRAZolam.  No orders of the defined types were placed in this encounter.     Follow-up: No Follow-up on file.  Walker Kehr, MD

## 2015-05-07 NOTE — Progress Notes (Signed)
Pre visit review using our clinic review tool, if applicable. No additional management support is needed unless otherwise documented below in the visit note. 

## 2015-05-07 NOTE — Assessment & Plan Note (Signed)
Monitor PSA 

## 2015-05-07 NOTE — Assessment & Plan Note (Signed)
D/c gabapentin a trial of Lyrica

## 2015-05-07 NOTE — Assessment & Plan Note (Addendum)
Chronic   Wife is c/o pt's irritability - Psychology referral   Potential benefits of a long term benzodiazepines  use as well as potential risks  and complications were explained to the patient and were aknowledged. UDS (-) Xanax

## 2015-05-10 ENCOUNTER — Telehealth: Payer: Self-pay

## 2015-05-10 NOTE — Telephone Encounter (Signed)
PA initiated via [employer] [phone]

## 2015-05-12 ENCOUNTER — Ambulatory Visit
Admission: RE | Admit: 2015-05-12 | Discharge: 2015-05-12 | Disposition: A | Discharge status: Home / Self Care | Payer: 59 | Source: Ambulatory Visit | Attending: Family Medicine | Admitting: Family Medicine

## 2015-05-12 ENCOUNTER — Telehealth: Payer: Self-pay | Admitting: Internal Medicine

## 2015-05-12 DIAGNOSIS — M5416 Radiculopathy, lumbar region: Secondary | ICD-10-CM

## 2015-05-12 MED ORDER — METHYLPREDNISOLONE ACETATE 40 MG/ML INJ SUSP (RADIOLOG
120.0000 mg | Freq: Once | INTRAMUSCULAR | Status: AC
  Administered 2015-05-12: 120 mg via EPIDURAL

## 2015-05-12 MED ORDER — IOHEXOL 180 MG/ML  SOLN
1.0000 mL | Freq: Once | INTRAMUSCULAR | Status: AC | PRN
  Administered 2015-05-12: 1 mL via EPIDURAL

## 2015-05-12 NOTE — Discharge Instructions (Signed)

## 2015-05-12 NOTE — Telephone Encounter (Signed)
Pt wife called in  and said that pt is having really bad acid reflux.  She wants to know what he can take for this ?

## 2015-05-12 NOTE — Telephone Encounter (Signed)
PA APPROVED through 05/09/2016

## 2015-05-13 MED ORDER — PANTOPRAZOLE SODIUM 40 MG PO TBEC: 40.0000 mg | DELAYED_RELEASE_TABLET | Freq: Every day | ORAL | Status: DC

## 2015-05-13 NOTE — Telephone Encounter (Signed)
Pts wife informed.

## 2015-05-13 NOTE — Telephone Encounter (Signed)
Pantoprazole - emailed Thx

## 2015-06-03 ENCOUNTER — Other Ambulatory Visit: Payer: Self-pay | Admitting: *Deleted

## 2015-06-03 DIAGNOSIS — M5416 Radiculopathy, lumbar region: Secondary | ICD-10-CM

## 2015-07-02 ENCOUNTER — Other Ambulatory Visit: Payer: Self-pay | Admitting: Internal Medicine

## 2015-07-30 ENCOUNTER — Ambulatory Visit (INDEPENDENT_AMBULATORY_CARE_PROVIDER_SITE_OTHER): Payer: 59 | Admitting: Urology

## 2015-07-30 DIAGNOSIS — R972 Elevated prostate specific antigen [PSA]: Secondary | ICD-10-CM

## 2015-07-30 DIAGNOSIS — N5201 Erectile dysfunction due to arterial insufficiency: Secondary | ICD-10-CM

## 2015-07-30 DIAGNOSIS — N411 Chronic prostatitis: Secondary | ICD-10-CM | POA: Diagnosis not present

## 2015-07-30 DIAGNOSIS — N401 Enlarged prostate with lower urinary tract symptoms: Secondary | ICD-10-CM | POA: Diagnosis not present

## 2015-08-06 ENCOUNTER — Encounter: Payer: 59 | Admitting: Internal Medicine

## 2015-08-09 ENCOUNTER — Ambulatory Visit (INDEPENDENT_AMBULATORY_CARE_PROVIDER_SITE_OTHER): Payer: 59 | Admitting: Internal Medicine

## 2015-08-09 ENCOUNTER — Encounter: Payer: Self-pay | Admitting: Internal Medicine

## 2015-08-09 VITALS — BP 118/88 | HR 65 | Ht 67.0 in | Wt 166.0 lb

## 2015-08-09 DIAGNOSIS — M545 Low back pain, unspecified: Secondary | ICD-10-CM

## 2015-08-09 DIAGNOSIS — M79605 Pain in left leg: Secondary | ICD-10-CM

## 2015-08-09 DIAGNOSIS — Z0000 Encounter for general adult medical examination without abnormal findings: Secondary | ICD-10-CM | POA: Diagnosis not present

## 2015-08-09 DIAGNOSIS — F411 Generalized anxiety disorder: Secondary | ICD-10-CM | POA: Diagnosis not present

## 2015-08-09 MED ORDER — PANTOPRAZOLE SODIUM 40 MG PO TBEC: 40.0000 mg | DELAYED_RELEASE_TABLET | Freq: Every day | ORAL | Status: DC

## 2015-08-09 MED ORDER — TIZANIDINE HCL 2 MG PO TABS: 2.0000 mg | ORAL_TABLET | Freq: Three times a day (TID) | ORAL | Status: DC | PRN

## 2015-08-09 NOTE — Assessment & Plan Note (Signed)
Dr Tamala Julian has ref to a nerve ablation Tizanidine prn

## 2015-08-09 NOTE — Progress Notes (Signed)
Pre visit review using our clinic review tool, if applicable. No additional management support is needed unless otherwise documented below in the visit note. 

## 2015-08-09 NOTE — Assessment & Plan Note (Signed)
We discussed age appropriate health related issues, including available/recomended screening tests and vaccinations. We discussed a need for adhering to healthy diet and exercise. Labs/EKG were reviewed/ordered. All questions were answered.   

## 2015-08-09 NOTE — Assessment & Plan Note (Signed)
Chronic  Potential benefits of a long term benzodiazepines  use as well as potential risks  and complications were explained to the patient and were aknowledged. 

## 2015-08-09 NOTE — Progress Notes (Signed)
Subjective:  Patient ID: Joseph Preston, male    DOB: 1951-01-07  Age: 65 y.o. MRN: [id number]  CC: Annual Exam   HPI GREGRY SAXON presents for a well exam F/u mouth sores, LBP w/spasm  Outpatient Prescriptions Prior to Visit  Medication Sig Dispense Refill  . ALPRAZolam (XANAX) 0.25 MG tablet Take 1 tablet (0.25 mg total) by mouth 2 (two) times daily as needed. for anxiety 90 tablet 2  . cyclobenzaprine (AMRIX) 15 MG 24 hr capsule Take 1 capsule (15 mg total) by mouth daily as needed for muscle spasms. 30 capsule 3  . hyoscyamine (LEVSIN, ANASPAZ) 0.125 MG tablet TAKE ONE TO TWO TABLETS BY MOUTH TWICE DAILY AS NEEDED FOR SPASMS/IBS 100 tablet 0  . pantoprazole (PROTONIX) 40 MG tablet Take 1 tablet (40 mg total) by mouth daily. 30 tablet 5  . pregabalin (LYRICA) 75 MG capsule Take 1 capsule (75 mg total) by mouth 2 (two) times daily. 60 capsule 5  . Probiotic Product (ALIGN) 4 MG CAPS Take 1 capsule by mouth daily. 30 capsule 3  . triamcinolone (KENALOG) 0.1 % paste Use as directed 1 application in the mouth or throat 3 (three) times daily. Mouth ulcer 5 g 2  . vardenafil (LEVITRA) 20 MG tablet Take 1 tablet (20 mg total) by mouth daily as needed for erectile dysfunction. 20 tablet 6   No facility-administered medications prior to visit.    ROS Review of Systems  Constitutional: Negative for appetite change, fatigue and unexpected weight change.  HENT: Negative for congestion, nosebleeds, sneezing, sore throat and trouble swallowing.   Eyes: Negative for itching and visual disturbance.  Respiratory: Negative for cough.   Cardiovascular: Negative for chest pain, palpitations and leg swelling.  Gastrointestinal: Negative for nausea, diarrhea, blood in stool and abdominal distention.  Genitourinary: Negative for frequency and hematuria.  Musculoskeletal: Positive for back pain. Negative for joint swelling, gait problem and neck pain.  Skin: Negative for rash.  Neurological:  Negative for dizziness, tremors, speech difficulty and weakness.  Psychiatric/Behavioral: Negative for sleep disturbance, dysphoric mood and agitation. The patient is not nervous/anxious.     Objective:  BP 118/88 mmHg  Pulse 65  Ht 5\' 7"  (1.702 m)  Wt 166 lb (75.297 kg)  BMI 25.99 kg/m2  SpO2 96%  BP Readings from Last 3 Encounters:  08/09/15 118/88  05/12/15 134/82  05/07/15 100/68    Wt Readings from Last 3 Encounters:  08/09/15 166 lb (75.297 kg)  01/05/15 167 lb (75.751 kg)  10/06/14 162 lb (73.483 kg)    Physical Exam  Constitutional: He is oriented to person, place, and time. He appears well-developed. No distress.  NAD  HENT:  Mouth/Throat: Oropharynx is clear and moist.  Eyes: Conjunctivae are normal. Pupils are equal, round, and reactive to light.  Neck: Normal range of motion. No JVD present. No thyromegaly present.  Cardiovascular: Normal rate, regular rhythm, normal heart sounds and intact distal pulses.  Exam reveals no gallop and no friction rub.   No murmur heard. Pulmonary/Chest: Effort normal and breath sounds normal. No respiratory distress. He has no wheezes. He has no rales. He exhibits no tenderness.  Abdominal: Soft. Bowel sounds are normal. He exhibits no distension and no mass. There is no tenderness. There is no rebound and no guarding.  Musculoskeletal: Normal range of motion. He exhibits tenderness. He exhibits no edema.  Lymphadenopathy:    He has no cervical adenopathy.  Neurological: He is alert and oriented to person, place,  and time. He has normal reflexes. No cranial nerve deficit. He exhibits normal muscle tone. He displays a negative Romberg sign. Coordination and gait normal.  Skin: Skin is warm and dry. No rash noted.  Psychiatric: He has a normal mood and affect. His behavior is normal. Judgment and thought content normal.  No teeth LS tender w/ROM Rectal was just done by Urology  Procedure: EKG Indication: well,  bradycardia Impression: S brady. No acute changes.   Lab Results  Component Value Date   WBC 6.6 06/02/2013   HGB 14.1 06/02/2013   HCT 42.0 06/02/2013   PLT 221.0 06/02/2013   GLUCOSE 94 06/02/2013   ALT 36 06/02/2013   AST 29 06/02/2013   NA 138 06/02/2013   K 4.5 06/02/2013   CL 102 06/02/2013   CREATININE 1.0 06/02/2013   BUN 16 06/02/2013   CO2 29 06/02/2013   TSH 1.06 06/02/2013   PSA 8.63* 08/12/2013    Dg Epidurography  05/12/2015  CLINICAL DATA:  Lumbosacral spondylosis without myelopathy. Left greater than right lower extremity pain. Good relief from the prior epidural injection. FLUOROSCOPY TIME:  Radiation Exposure Index (as provided by the fluoroscopic device): 12.72 microGray*m^2 Fluoroscopy Time (in minutes and seconds):  10 seconds PROCEDURE: The procedure, risks, benefits, and alternatives were explained to the patient. Questions regarding the procedure were encouraged and answered. The patient understands and consents to the procedure. LUMBAR EPIDURAL INJECTION: An interlaminar approach was performed on the left at L4-5. The overlying skin was cleansed and anesthetized. A 3.5 inch, 20 gauge spinal needle was advanced using loss-of-resistance technique. DIAGNOSTIC EPIDURAL INJECTION: Injection of Isovue-M 200 shows a good epidural pattern with spread above and below the level of needle placement, primarily on the left. No vascular opacification is seen. THERAPEUTIC EPIDURAL INJECTION: 120 mg of Depo-Medrol mixed with 3 mL of 1% lidocaine were instilled. The procedure was well-tolerated, and the patient was discharged thirty minutes following the injection in good condition. COMPLICATIONS: None IMPRESSION: Technically successful lumbar interlaminar epidural injection on the left at L4-5. Electronically Signed   By: Logan Bores M.D.   On: 05/12/2015 16:23    Assessment & Plan:   There are no diagnoses linked to this encounter. I am having Mr. Johnes maintain his  vardenafil, triamcinolone, ALIGN, cyclobenzaprine, ALPRAZolam, pregabalin, pantoprazole, and hyoscyamine.  No orders of the defined types were placed in this encounter.     Follow-up: No Follow-up on file.  Walker Kehr, MD

## 2015-08-18 ENCOUNTER — Other Ambulatory Visit (INDEPENDENT_AMBULATORY_CARE_PROVIDER_SITE_OTHER): Payer: 59

## 2015-08-18 DIAGNOSIS — R202 Paresthesia of skin: Secondary | ICD-10-CM | POA: Diagnosis not present

## 2015-08-18 DIAGNOSIS — R972 Elevated prostate specific antigen [PSA]: Secondary | ICD-10-CM

## 2015-08-18 DIAGNOSIS — Z0000 Encounter for general adult medical examination without abnormal findings: Secondary | ICD-10-CM | POA: Diagnosis not present

## 2015-08-18 LAB — CBC WITH DIFFERENTIAL/PLATELET
BASOS ABS: 0 10*3/uL (ref 0.0–0.1)
Basophils Relative: 0.1 % (ref 0.0–3.0)
EOS ABS: 0.1 10*3/uL (ref 0.0–0.7)
Eosinophils Relative: 2.2 % (ref 0.0–5.0)
HCT: 42.8 % (ref 39.0–52.0)
Hemoglobin: 14.3 g/dL (ref 13.0–17.0)
LYMPHS ABS: 2 10*3/uL (ref 0.7–4.0)
Lymphocytes Relative: 34.6 % (ref 12.0–46.0)
MCHC: 33.4 g/dL (ref 30.0–36.0)
MCV: 87.6 fl (ref 78.0–100.0)
Monocytes Absolute: 0.4 10*3/uL (ref 0.1–1.0)
Monocytes Relative: 7 % (ref 3.0–12.0)
NEUTROS ABS: 3.3 10*3/uL (ref 1.4–7.7)
NEUTROS PCT: 56.1 % (ref 43.0–77.0)
PLATELETS: 207 10*3/uL (ref 150.0–400.0)
RBC: 4.89 Mil/uL (ref 4.22–5.81)
RDW: 14 % (ref 11.5–15.5)
WBC: 5.8 10*3/uL (ref 4.0–10.5)

## 2015-08-18 LAB — LIPID PANEL
Cholesterol: 209 mg/dL — ABNORMAL HIGH (ref 0–200)
HDL: 60.6 mg/dL (ref >39.00)
LDL Cholesterol: 131 mg/dL — ABNORMAL HIGH (ref 0–99)
NonHDL: 148.36
Total CHOL/HDL Ratio: 3
Triglycerides: 89 mg/dL (ref 0.0–149.0)
VLDL: 17.8 mg/dL (ref 0.0–40.0)

## 2015-08-18 LAB — HEPATIC FUNCTION PANEL
ALBUMIN: 4.3 g/dL (ref 3.5–5.2)
ALK PHOS: 63 U/L (ref 39–117)
ALT: 20 U/L (ref 0–53)
AST: 22 U/L (ref 0–37)
BILIRUBIN DIRECT: 0.2 mg/dL (ref 0.0–0.3)
Total Bilirubin: 1.2 mg/dL (ref 0.2–1.2)
Total Protein: 6.9 g/dL (ref 6.0–8.3)

## 2015-08-18 LAB — BASIC METABOLIC PANEL
BUN: 21 mg/dL (ref 6–23)
CALCIUM: 10.3 mg/dL (ref 8.4–10.5)
CO2: 29 meq/L (ref 19–32)
CREATININE: 0.96 mg/dL (ref 0.40–1.50)
Chloride: 105 mEq/L (ref 96–112)
GFR: 83.73 mL/min (ref >60.00)
GLUCOSE: 120 mg/dL — AB (ref 70–99)
Potassium: 4 mEq/L (ref 3.5–5.1)
Sodium: 140 mEq/L (ref 135–145)

## 2015-08-18 LAB — VITAMIN B12: VITAMIN B 12: 480 pg/mL (ref 211–911)

## 2015-08-19 LAB — PSA, TOTAL AND FREE
PSA FREE PCT: 13 % — AB (ref >25)
PSA FREE: 1.26 ng/mL
PSA: 9.84 ng/mL — ABNORMAL HIGH (ref <=4.00)

## 2015-08-19 LAB — HEPATITIS C ANTIBODY: HCV Ab: NEGATIVE

## 2015-08-24 ENCOUNTER — Telehealth: Payer: Self-pay | Admitting: Emergency Medicine

## 2015-08-24 NOTE — Telephone Encounter (Signed)
I feel like I gave you this last week. Do you remember doing it?

## 2015-08-24 NOTE — Telephone Encounter (Signed)
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 He needs it filled out and faxed back by [facility]. Fax to [facility] Fax # is [phone]. Please follow up thanks.

## 2015-08-25 ENCOUNTER — Other Ambulatory Visit: Payer: Self-pay | Admitting: Family Medicine

## 2015-08-25 DIAGNOSIS — M5416 Radiculopathy, lumbar region: Secondary | ICD-10-CM

## 2015-08-25 NOTE — Telephone Encounter (Signed)
PCP states this has been done. Once I fax forms to the appropriate number on form I send the document(s) to be scanned into patient's chart.

## 2015-09-03 ENCOUNTER — Ambulatory Visit
Admission: RE | Admit: 2015-09-03 | Discharge: 2015-09-03 | Disposition: A | Discharge status: Home / Self Care | Payer: 59 | Source: Ambulatory Visit | Attending: Family Medicine | Admitting: Family Medicine

## 2015-09-03 ENCOUNTER — Other Ambulatory Visit: Payer: Self-pay | Admitting: Family Medicine

## 2015-09-03 DIAGNOSIS — M5416 Radiculopathy, lumbar region: Secondary | ICD-10-CM

## 2015-09-03 NOTE — Discharge Instructions (Signed)

## 2015-09-21 ENCOUNTER — Ambulatory Visit
Admission: RE | Admit: 2015-09-21 | Discharge: 2015-09-21 | Disposition: A | Discharge status: Home / Self Care | Payer: 59 | Source: Ambulatory Visit | Attending: Family Medicine | Admitting: Family Medicine

## 2015-09-21 ENCOUNTER — Other Ambulatory Visit: Payer: Self-pay | Admitting: Family Medicine

## 2015-09-21 VITALS — BP 141/82 | HR 57 | Resp 12

## 2015-09-21 DIAGNOSIS — M79605 Pain in left leg: Principal | ICD-10-CM

## 2015-09-21 DIAGNOSIS — M5416 Radiculopathy, lumbar region: Secondary | ICD-10-CM

## 2015-09-21 DIAGNOSIS — M545 Low back pain, unspecified: Secondary | ICD-10-CM

## 2015-09-21 MED ORDER — MIDAZOLAM HCL 2 MG/2ML IJ SOLN
1.0000 mg | INTRAMUSCULAR | Status: DC | PRN
  Administered 2015-09-21: 1 mg via INTRAVENOUS

## 2015-09-21 MED ORDER — METHYLPREDNISOLONE ACETATE 40 MG/ML INJ SUSP (RADIOLOG
80.0000 mg | Freq: Once | INTRAMUSCULAR | Status: AC
  Administered 2015-09-21: 80 mg via INTRALESIONAL

## 2015-09-21 MED ORDER — KETOROLAC TROMETHAMINE 30 MG/ML IJ SOLN
30.0000 mg | Freq: Once | INTRAMUSCULAR | Status: AC
  Administered 2015-09-21: 30 mg via INTRAVENOUS

## 2015-09-21 MED ORDER — SODIUM CHLORIDE 0.9 % IV SOLN
Freq: Once | INTRAVENOUS | Status: AC
  Administered 2015-09-21: 08:00:00 via INTRAVENOUS

## 2015-09-21 MED ORDER — FENTANYL CITRATE (PF) 100 MCG/2ML IJ SOLN
25.0000 ug | INTRAMUSCULAR | Status: DC | PRN
  Administered 2015-09-21: 50 ug via INTRAVENOUS

## 2015-09-21 NOTE — Discharge Instructions (Signed)
Radio Frequency Ablation Post Procedure Discharge Instructions ° °1. May resume a regular diet and any medications that you routinely take (including pain medications). °2. No driving day of procedure. °3. Upon discharge go home and rest for at least 4 hours.  May use an ice pack as needed to injection sites on back. °4. Remove bandades later, today. ° ° ° °Please contact our office at [phone] for the following symptoms: ° °· Fever greater than 100 degrees °· Increased swelling, pain, or redness at injection site. ° ° °Thank you for visiting [facility]. °

## 2015-09-21 NOTE — Progress Notes (Signed)
Sedation time for RFA was 28 minutes.  jkl

## 2015-10-25 ENCOUNTER — Telehealth: Payer: Self-pay | Admitting: Emergency Medicine

## 2015-10-25 DIAGNOSIS — R0683 Snoring: Secondary | ICD-10-CM

## 2015-10-25 NOTE — Telephone Encounter (Signed)
Pt called and wants to know if a referral for sleep study can be put in. Thanks.

## 2015-10-26 NOTE — Telephone Encounter (Signed)
He needs to see a sleep specialist first. I'll arrange Thx

## 2015-10-27 NOTE — Telephone Encounter (Signed)
Left detailed mess informing pt of below.  

## 2015-12-08 ENCOUNTER — Ambulatory Visit (INDEPENDENT_AMBULATORY_CARE_PROVIDER_SITE_OTHER): Payer: 59 | Admitting: Internal Medicine

## 2015-12-08 ENCOUNTER — Encounter: Payer: Self-pay | Admitting: Internal Medicine

## 2015-12-08 DIAGNOSIS — K588 Other irritable bowel syndrome: Secondary | ICD-10-CM

## 2015-12-08 DIAGNOSIS — M5416 Radiculopathy, lumbar region: Secondary | ICD-10-CM

## 2015-12-08 DIAGNOSIS — F411 Generalized anxiety disorder: Secondary | ICD-10-CM | POA: Diagnosis not present

## 2015-12-08 MED ORDER — ALPRAZOLAM 0.25 MG PO TABS: 0.2500 mg | ORAL_TABLET | Freq: Two times a day (BID) | ORAL | 2 refills | Status: DC | PRN

## 2015-12-08 MED ORDER — HYOSCYAMINE SULFATE 0.125 MG PO TABS: 0.1250 mg | ORAL_TABLET | Freq: Four times a day (QID) | ORAL | 1 refills | Status: DC | PRN

## 2015-12-08 NOTE — Progress Notes (Signed)
Subjective:  Patient ID: Joseph Preston, male    DOB: [birth year]  Age: 65 y.o. MRN: [id number]  CC: No chief complaint on file.   HPI Joseph Preston presents for anxiety, GERD, LBP f/u  Outpatient Medications Prior to Visit  Medication Sig Dispense Refill  . ALPRAZolam (XANAX) 0.25 MG tablet Take 1 tablet (0.25 mg total) by mouth 2 (two) times daily as needed. for anxiety 90 tablet 2  . hyoscyamine (LEVSIN, ANASPAZ) 0.125 MG tablet TAKE ONE TO TWO TABLETS BY MOUTH TWICE DAILY AS NEEDED FOR SPASMS/IBS 100 tablet 0  . pantoprazole (PROTONIX) 40 MG tablet Take 1 tablet (40 mg total) by mouth daily. 30 tablet 11  . tiZANidine (ZANAFLEX) 2 MG tablet Take 1 tablet (2 mg total) by mouth every 8 (eight) hours as needed for muscle spasms. 90 tablet 2  . triamcinolone (KENALOG) 0.1 % paste Use as directed 1 application in the mouth or throat 3 (three) times daily. Mouth ulcer (Patient not taking: Reported on 12/08/2015) 5 g 2   No facility-administered medications prior to visit.     ROS Review of Systems  Constitutional: Negative for appetite change, fatigue and unexpected weight change.  HENT: Negative for congestion, nosebleeds, sneezing, sore throat and trouble swallowing.   Eyes: Negative for itching and visual disturbance.  Respiratory: Negative for cough.   Cardiovascular: Negative for chest pain, palpitations and leg swelling.  Gastrointestinal: Negative for abdominal distention, blood in stool, diarrhea and nausea.  Genitourinary: Negative for frequency and hematuria.  Musculoskeletal: Positive for back pain. Negative for gait problem, joint swelling and neck pain.  Skin: Negative for rash.  Neurological: Negative for dizziness, tremors, speech difficulty and weakness.  Psychiatric/Behavioral: Negative for agitation, dysphoric mood and sleep disturbance. The patient is nervous/anxious.     Objective:  BP 130/90   Pulse 73   Wt 167 lb (75.8 kg)   SpO2 97%   BMI 26.16  kg/m   BP Readings from Last 3 Encounters:  12/08/15 130/90  09/21/15 (!) 141/82  09/03/15 (!) 143/84    Wt Readings from Last 3 Encounters:  12/08/15 167 lb (75.8 kg)  08/09/15 166 lb (75.3 kg)  01/05/15 167 lb (75.8 kg)    Physical Exam  Constitutional: He is oriented to person, place, and time. He appears well-developed. No distress.  NAD  HENT:  Mouth/Throat: Oropharynx is clear and moist.  Eyes: Conjunctivae are normal. Pupils are equal, round, and reactive to light.  Neck: Normal range of motion. No JVD present. No thyromegaly present.  Cardiovascular: Normal rate, regular rhythm, normal heart sounds and intact distal pulses.  Exam reveals no gallop and no friction rub.   No murmur heard. Pulmonary/Chest: Effort normal and breath sounds normal. No respiratory distress. He has no wheezes. He has no rales. He exhibits no tenderness.  Abdominal: Soft. Bowel sounds are normal. He exhibits no distension and no mass. There is no tenderness. There is no rebound and no guarding.  Musculoskeletal: Normal range of motion. He exhibits tenderness. He exhibits no edema.  Lymphadenopathy:    He has no cervical adenopathy.  Neurological: He is alert and oriented to person, place, and time. He has normal reflexes. No cranial nerve deficit. He exhibits normal muscle tone. He displays a negative Romberg sign. Coordination and gait normal.  Skin: Skin is warm and dry. No rash noted.  Psychiatric: He has a normal mood and affect. His behavior is normal. Judgment and thought content normal.    Lab  Results  Component Value Date   WBC 5.8 08/18/2015   HGB 14.3 08/18/2015   HCT 42.8 08/18/2015   PLT 207.0 08/18/2015   GLUCOSE 120 (H) 08/18/2015   CHOL 209 (H) 08/18/2015   TRIG 89.0 08/18/2015   HDL 60.60 08/18/2015   LDLCALC 131 (H) 08/18/2015   ALT 20 08/18/2015   AST 22 08/18/2015   NA 140 08/18/2015   K 4.0 08/18/2015   CL 105 08/18/2015   CREATININE 0.96 08/18/2015   BUN 21  08/18/2015   CO2 29 08/18/2015   TSH 1.06 06/02/2013   PSA 9.84 (H) 08/18/2015    Dg Facet Jt Neuro Destruct Sing L/s W/img Guide  Result Date: 09/21/2015 CLINICAL DATA:  Lumbosacral spondylosis without myelopathy. Symptomatic lumbago. EXAM: LEFT L4-5 FACET DENERVATION USING LEFT L3 AND LEFT L4 MEDIAL BRANCH RADIOFREQUENCY ABLATION FLUOROSCOPY TIME:  34 seconds corresponding to a Dose Area Product of 53.14 Gy*m2 TECHNIQUE: Informed written consent was obtained.  Time-out was performed. An appropriate skin entry site was determined fluoroscopically and marked. Operator donned sterile gloves and mask. Site prepped with betadine, draped in usual sterile fashion, and infiltrated locally with 1% lidocaine. A 10 cm, 20 gauge Stryker needle with a 10 mm active tip was placed at the junction of the LEFT L4 superior articular process and LEFT L4 transverse process, directed 1 mm ventrally over the notch, to lie along the course of the LEFT L3 medial branch. Needle tip was confirmed to be in good position fluoroscopically, dorsal to the foramen. Sensory and motor stimulation elicited concordant deep local pain without radicular symptoms or lower extremity fasciculation. After tandem needle placement to allow 1% lidocaine 1 mL around the needle tip, a radiofrequency burn was performed at 80 degrees centigrade for 90 seconds. Postprocedure 40 mg Depo-Medrol was instilled. An identical procedure was performed on the LEFT at the junction LEFT L5 superior articular process and LEFT L5 transverse process, along the course the LEFT L4 medial branch. Sensory and motor stimulation was carried out, eliciting deep local pain without radicular symptoms. Again a radiofrequency burn was performed at 80 degrees centigrade for 90 seconds, with 40 mg Depo-Medrol postprocedure instilled around the burn site. IMPRESSION: 1. Technically successful LEFT L4-5 facet denervation. The patient was comfortable time of discharge. Electronically  Signed   By: Staci Righter M.D.   On: 09/21/2015 10:13   Dg Facet Jt Neuro Destruct Ea Add L/s W/img Guide  Result Date: 09/21/2015 CLINICAL DATA:  Lumbosacral spondylosis without myelopathy. Symptomatic lumbago. EXAM: LEFT L4-5 FACET DENERVATION USING LEFT L3 AND LEFT L4 MEDIAL BRANCH RADIOFREQUENCY ABLATION FLUOROSCOPY TIME:  34 seconds corresponding to a Dose Area Product of 53.14 Gy*m2 TECHNIQUE: Informed written consent was obtained.  Time-out was performed. An appropriate skin entry site was determined fluoroscopically and marked. Operator donned sterile gloves and mask. Site prepped with betadine, draped in usual sterile fashion, and infiltrated locally with 1% lidocaine. A 10 cm, 20 gauge Stryker needle with a 10 mm active tip was placed at the junction of the LEFT L4 superior articular process and LEFT L4 transverse process, directed 1 mm ventrally over the notch, to lie along the course of the LEFT L3 medial branch. Needle tip was confirmed to be in good position fluoroscopically, dorsal to the foramen. Sensory and motor stimulation elicited concordant deep local pain without radicular symptoms or lower extremity fasciculation. After tandem needle placement to allow 1% lidocaine 1 mL around the needle tip, a radiofrequency burn was performed at 80  degrees centigrade for 90 seconds. Postprocedure 40 mg Depo-Medrol was instilled. An identical procedure was performed on the LEFT at the junction LEFT L5 superior articular process and LEFT L5 transverse process, along the course the LEFT L4 medial branch. Sensory and motor stimulation was carried out, eliciting deep local pain without radicular symptoms. Again a radiofrequency burn was performed at 80 degrees centigrade for 90 seconds, with 40 mg Depo-Medrol postprocedure instilled around the burn site. IMPRESSION: 1. Technically successful LEFT L4-5 facet denervation. The patient was comfortable time of discharge. Electronically Signed   By: Staci Righter  M.D.   On: 09/21/2015 10:13    Assessment & Plan:   There are no diagnoses linked to this encounter. I am having Mr. Schwenke maintain his triamcinolone, ALPRAZolam, hyoscyamine, pantoprazole, and tiZANidine.  No orders of the defined types were placed in this encounter.    Follow-up: No Follow-up on file.  Walker Kehr, MD

## 2015-12-08 NOTE — Progress Notes (Signed)
Pre visit review using our clinic review tool, if applicable. No additional management support is needed unless otherwise documented below in the visit note. 

## 2015-12-08 NOTE — Assessment & Plan Note (Signed)
Zanaflex is not covered

## 2015-12-08 NOTE — Patient Instructions (Signed)
[employer], a product of [employer] [place] is a [employer] with a Diplomatic Services operational officer. Enrollment in [place] depends on contract renewal. You must continue to pay your Medicare Part B premium. This information is not a complete description of benefits. Contact the plan for more information. Limitations, copayments, and restrictions may apply. Benefits, premiums and/or copayments/coinsurance may change on January 1 of each year. The Formulary, pharmacy network, and/or provider network may change at any time. You will receive notice when necessary. [employer], [address], [address], [place], [place]. To learn more, please call [phone] ([phone]) from 8 a.m. to 8 p.m. seven days a week.  Copyright 2017 - [employer] - All rights reserved. [id number] Pending CMS Approval

## 2015-12-08 NOTE — Assessment & Plan Note (Signed)
Xanax prn  Potential benefits of a long term benzodiazepines  use as well as potential risks  and complications were explained to the patient and were aknowledged. 

## 2015-12-08 NOTE — Assessment & Plan Note (Signed)
Levsin as needed

## 2016-01-28 ENCOUNTER — Ambulatory Visit: Payer: 59 | Admitting: Urology

## 2016-03-10 ENCOUNTER — Ambulatory Visit (INDEPENDENT_AMBULATORY_CARE_PROVIDER_SITE_OTHER): Payer: 59 | Admitting: Internal Medicine

## 2016-03-10 ENCOUNTER — Encounter: Payer: Self-pay | Admitting: Internal Medicine

## 2016-03-10 ENCOUNTER — Encounter (INDEPENDENT_AMBULATORY_CARE_PROVIDER_SITE_OTHER): Payer: 59

## 2016-03-10 DIAGNOSIS — G4700 Insomnia, unspecified: Secondary | ICD-10-CM

## 2016-03-10 DIAGNOSIS — R7309 Other abnormal glucose: Secondary | ICD-10-CM | POA: Diagnosis not present

## 2016-03-10 DIAGNOSIS — F411 Generalized anxiety disorder: Secondary | ICD-10-CM

## 2016-03-10 LAB — BASIC METABOLIC PANEL
BUN: 18 mg/dL (ref 6–23)
CALCIUM: 10.3 mg/dL (ref 8.4–10.5)
CO2: 31 meq/L (ref 19–32)
CREATININE: 1.02 mg/dL (ref 0.40–1.50)
Chloride: 106 mEq/L (ref 96–112)
GFR: 77.93 mL/min (ref >60.00)
Glucose, Bld: 88 mg/dL (ref 70–99)
Potassium: 4.9 mEq/L (ref 3.5–5.1)
SODIUM: 142 meq/L (ref 135–145)

## 2016-03-10 LAB — HEMOGLOBIN A1C: HEMOGLOBIN A1C: 5.7 % (ref 4.6–6.5)

## 2016-03-10 MED ORDER — CYCLOBENZAPRINE HCL 5 MG PO TABS: 5.0000 mg | ORAL_TABLET | Freq: Three times a day (TID) | ORAL | 1 refills | Status: DC | PRN

## 2016-03-10 MED ORDER — NORTRIPTYLINE HCL 10 MG PO CAPS: 10.0000 mg | ORAL_CAPSULE | Freq: Every day | ORAL | 3 refills | Status: DC

## 2016-03-10 NOTE — Assessment & Plan Note (Signed)
Nortriptyline at hs

## 2016-03-10 NOTE — Progress Notes (Signed)
Subjective:  Patient ID: Joseph Preston, male    DOB: [birth year]  Age: 66 y.o. MRN: [id number]  CC: No chief complaint on file.   HPI Joseph Preston presents for GERD, OA, LBP f/u. C/o tizanidine being too $$. C/o insomnia  Outpatient Medications Prior to Visit  Medication Sig Dispense Refill  . ALPRAZolam (XANAX) 0.25 MG tablet Take 1 tablet (0.25 mg total) by mouth 2 (two) times daily as needed. for anxiety 90 tablet 2  . hyoscyamine (LEVSIN, ANASPAZ) 0.125 MG tablet Take 1 tablet (0.125 mg total) by mouth every 6 (six) hours as needed. 100 tablet 1  . pantoprazole (PROTONIX) 40 MG tablet Take 1 tablet (40 mg total) by mouth daily. 30 tablet 11  . tiZANidine (ZANAFLEX) 2 MG tablet Take 1 tablet (2 mg total) by mouth every 8 (eight) hours as needed for muscle spasms. 90 tablet 2  . triamcinolone (KENALOG) 0.1 % paste Use as directed 1 application in the mouth or throat 3 (three) times daily. Mouth ulcer 5 g 2   No facility-administered medications prior to visit.     ROS Review of Systems  Constitutional: Negative for appetite change, fatigue and unexpected weight change.  HENT: Negative for congestion, nosebleeds, sneezing, sore throat and trouble swallowing.   Eyes: Negative for itching and visual disturbance.  Respiratory: Negative for cough.   Cardiovascular: Negative for chest pain, palpitations and leg swelling.  Gastrointestinal: Negative for abdominal distention, blood in stool, diarrhea and nausea.  Genitourinary: Negative for frequency and hematuria.  Musculoskeletal: Positive for back pain. Negative for gait problem, joint swelling and neck pain.  Skin: Negative for rash.  Neurological: Negative for dizziness, tremors, speech difficulty and weakness.  Psychiatric/Behavioral: Positive for sleep disturbance. Negative for agitation, dysphoric mood and suicidal ideas. The patient is nervous/anxious.     Objective:  BP 136/76   Pulse 72   Resp 20   Wt 161 lb 2 oz  (73.1 kg)   SpO2 97%   BMI 25.24 kg/m   BP Readings from Last 3 Encounters:  03/10/16 136/76  12/08/15 130/90  09/21/15 (!) 141/82    Wt Readings from Last 3 Encounters:  03/10/16 161 lb 2 oz (73.1 kg)  12/08/15 167 lb (75.8 kg)  08/09/15 166 lb (75.3 kg)    Physical Exam  Constitutional: He is oriented to person, place, and time. He appears well-developed. No distress.  NAD  HENT:  Mouth/Throat: Oropharynx is clear and moist.  Eyes: Conjunctivae are normal. Pupils are equal, round, and reactive to light.  Neck: Normal range of motion. No JVD present. No thyromegaly present.  Cardiovascular: Normal rate, regular rhythm, normal heart sounds and intact distal pulses.  Exam reveals no gallop and no friction rub.   No murmur heard. Pulmonary/Chest: Effort normal and breath sounds normal. No respiratory distress. He has no wheezes. He has no rales. He exhibits no tenderness.  Abdominal: Soft. Bowel sounds are normal. He exhibits no distension and no mass. There is no tenderness. There is no rebound and no guarding.  Musculoskeletal: Normal range of motion. He exhibits tenderness. He exhibits no edema.  Lymphadenopathy:    He has no cervical adenopathy.  Neurological: He is alert and oriented to person, place, and time. He has normal reflexes. No cranial nerve deficit. He exhibits normal muscle tone. He displays a negative Romberg sign. Coordination and gait normal.  Skin: Skin is warm and dry. No rash noted.  Psychiatric: He has a normal mood and affect.  His behavior is normal. Judgment and thought content normal.  LS tender  Lab Results  Component Value Date   WBC 5.8 08/18/2015   HGB 14.3 08/18/2015   HCT 42.8 08/18/2015   PLT 207.0 08/18/2015   GLUCOSE 120 (H) 08/18/2015   CHOL 209 (H) 08/18/2015   TRIG 89.0 08/18/2015   HDL 60.60 08/18/2015   LDLCALC 131 (H) 08/18/2015   ALT 20 08/18/2015   AST 22 08/18/2015   NA 140 08/18/2015   K 4.0 08/18/2015   CL 105 08/18/2015     CREATININE 0.96 08/18/2015   BUN 21 08/18/2015   CO2 29 08/18/2015   TSH 1.06 06/02/2013   PSA 9.84 (H) 08/18/2015    Dg Facet Jt Neuro Destruct Sing L/s W/img Guide  Result Date: 09/21/2015 CLINICAL DATA:  Lumbosacral spondylosis without myelopathy. Symptomatic lumbago. EXAM: LEFT L4-5 FACET DENERVATION USING LEFT L3 AND LEFT L4 MEDIAL BRANCH RADIOFREQUENCY ABLATION FLUOROSCOPY TIME:  34 seconds corresponding to a Dose Area Product of 53.14 Gy*m2 TECHNIQUE: Informed written consent was obtained.  Time-out was performed. An appropriate skin entry site was determined fluoroscopically and marked. Operator donned sterile gloves and mask. Site prepped with betadine, draped in usual sterile fashion, and infiltrated locally with 1% lidocaine. A 10 cm, 20 gauge Stryker needle with a 10 mm active tip was placed at the junction of the LEFT L4 superior articular process and LEFT L4 transverse process, directed 1 mm ventrally over the notch, to lie along the course of the LEFT L3 medial branch. Needle tip was confirmed to be in good position fluoroscopically, dorsal to the foramen. Sensory and motor stimulation elicited concordant deep local pain without radicular symptoms or lower extremity fasciculation. After tandem needle placement to allow 1% lidocaine 1 mL around the needle tip, a radiofrequency burn was performed at 80 degrees centigrade for 90 seconds. Postprocedure 40 mg Depo-Medrol was instilled. An identical procedure was performed on the LEFT at the junction LEFT L5 superior articular process and LEFT L5 transverse process, along the course the LEFT L4 medial branch. Sensory and motor stimulation was carried out, eliciting deep local pain without radicular symptoms. Again a radiofrequency burn was performed at 80 degrees centigrade for 90 seconds, with 40 mg Depo-Medrol postprocedure instilled around the burn site. IMPRESSION: 1. Technically successful LEFT L4-5 facet denervation. The patient was  comfortable time of discharge. Electronically Signed   By: Staci Righter M.D.   On: 09/21/2015 10:13   Dg Facet Jt Neuro Destruct Ea Add L/s W/img Guide  Result Date: 09/21/2015 CLINICAL DATA:  Lumbosacral spondylosis without myelopathy. Symptomatic lumbago. EXAM: LEFT L4-5 FACET DENERVATION USING LEFT L3 AND LEFT L4 MEDIAL BRANCH RADIOFREQUENCY ABLATION FLUOROSCOPY TIME:  34 seconds corresponding to a Dose Area Product of 53.14 Gy*m2 TECHNIQUE: Informed written consent was obtained.  Time-out was performed. An appropriate skin entry site was determined fluoroscopically and marked. Operator donned sterile gloves and mask. Site prepped with betadine, draped in usual sterile fashion, and infiltrated locally with 1% lidocaine. A 10 cm, 20 gauge Stryker needle with a 10 mm active tip was placed at the junction of the LEFT L4 superior articular process and LEFT L4 transverse process, directed 1 mm ventrally over the notch, to lie along the course of the LEFT L3 medial branch. Needle tip was confirmed to be in good position fluoroscopically, dorsal to the foramen. Sensory and motor stimulation elicited concordant deep local pain without radicular symptoms or lower extremity fasciculation. After tandem needle placement to allow  1% lidocaine 1 mL around the needle tip, a radiofrequency burn was performed at 80 degrees centigrade for 90 seconds. Postprocedure 40 mg Depo-Medrol was instilled. An identical procedure was performed on the LEFT at the junction LEFT L5 superior articular process and LEFT L5 transverse process, along the course the LEFT L4 medial branch. Sensory and motor stimulation was carried out, eliciting deep local pain without radicular symptoms. Again a radiofrequency burn was performed at 80 degrees centigrade for 90 seconds, with 40 mg Depo-Medrol postprocedure instilled around the burn site. IMPRESSION: 1. Technically successful LEFT L4-5 facet denervation. The patient was comfortable time of  discharge. Electronically Signed   By: Staci Righter M.D.   On: 09/21/2015 10:13    Assessment & Plan:   There are no diagnoses linked to this encounter. I am having Mr. Phegley maintain his triamcinolone, pantoprazole, tiZANidine, ALPRAZolam, and hyoscyamine.  No orders of the defined types were placed in this encounter.    Follow-up: No Follow-up on file.  Walker Kehr, MD

## 2016-03-10 NOTE — Assessment & Plan Note (Signed)
Labs

## 2016-03-10 NOTE — Assessment & Plan Note (Signed)
Start Nortriptyline at hs

## 2016-03-10 NOTE — Progress Notes (Signed)
Pre visit review using our clinic review tool, if applicable. No additional management support is needed unless otherwise documented below in the visit note. 

## 2016-03-13 IMAGING — MR MR LUMBAR SPINE W/O CM
4 of 5 series · 19 of 48 positions shown · non-contrast
Comparison: None.

CLINICAL DATA: Low back and left buttock pain. Posterior left leg
pain and weakness. Symptoms for 6 months.

EXAM:
MRI LUMBAR SPINE WITHOUT CONTRAST
TECHNIQUE: Multiplanar, multisequence MR imaging of the lumbar spine was
performed. No intravenous contrast was administered.

[Series 2: T1 · sagittal · 4.0mm · 0.51mm/px · 3 of 12 slices shown (1 of 2)]
[im 1/12]
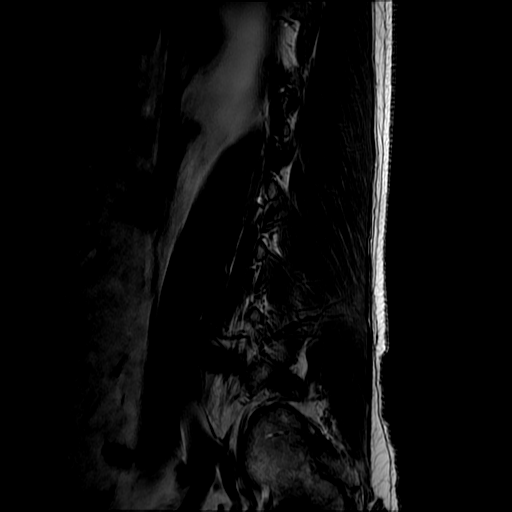
[im 6/12]
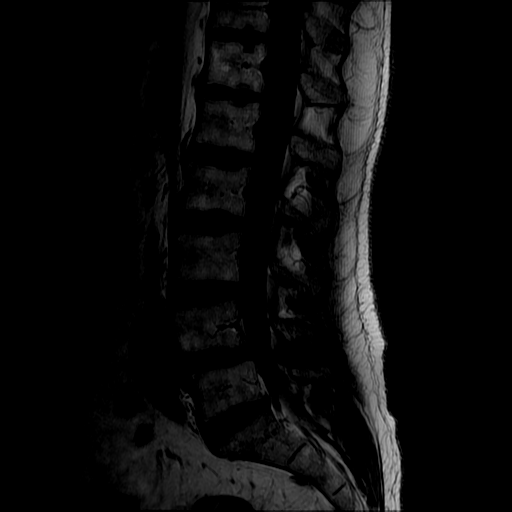
[im 12/12]
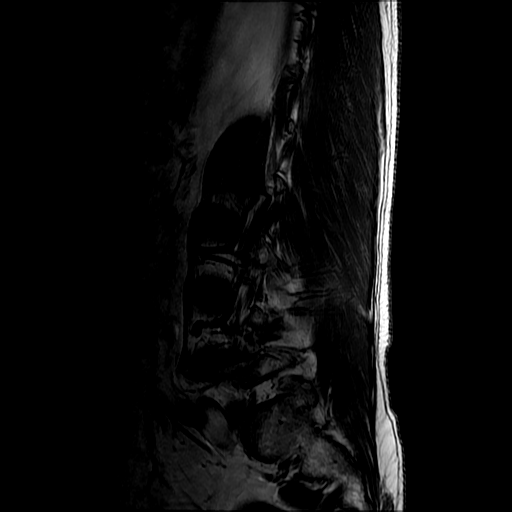

[Series 3: T2 · sagittal · 4.0mm · 0.51mm/px · 5 of 12 slices shown (1 of 2)]
[im 1/12]
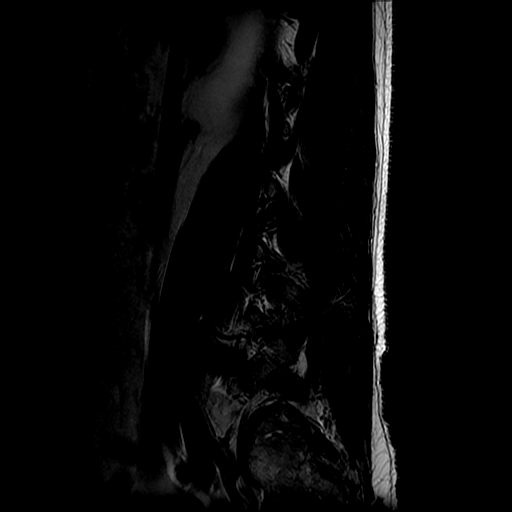
[im 3/12]
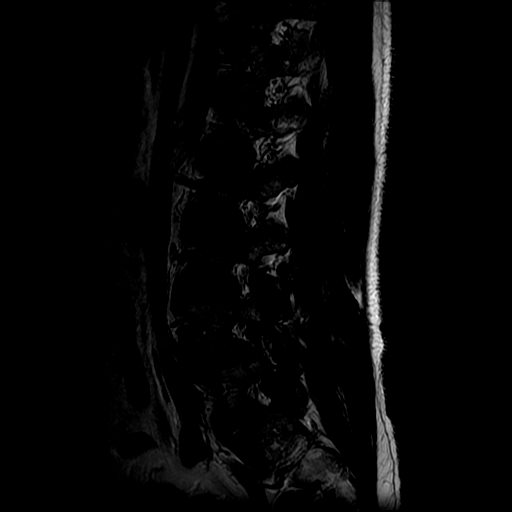
[im 6/12]
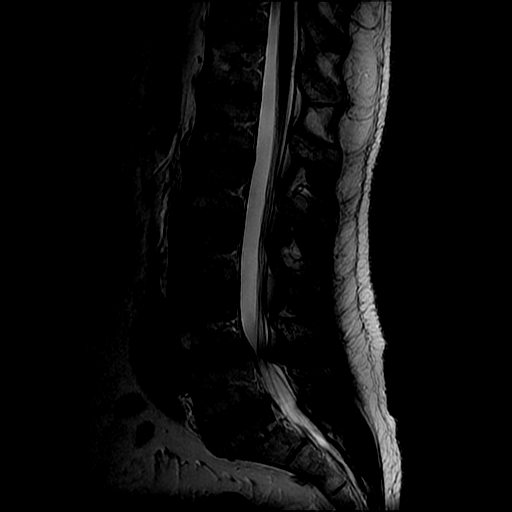
[im 9/12]
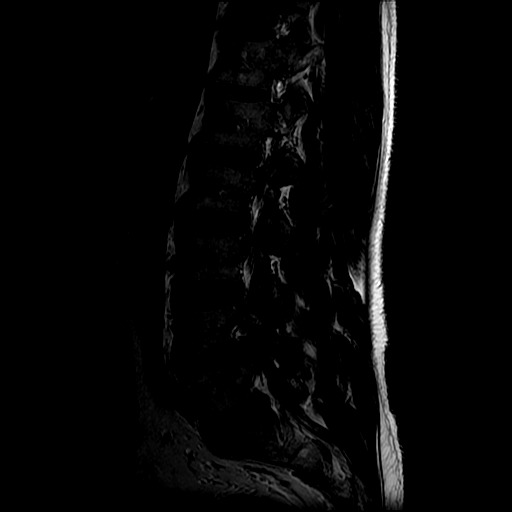
[im 12/12]
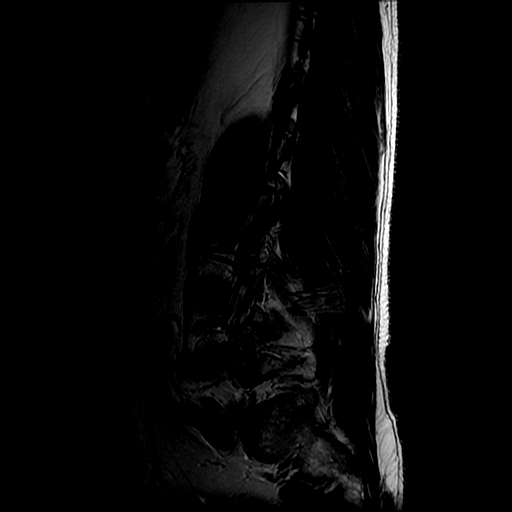

[Series 5: T2 · axial · 4.0mm · 0.39mm/px · z∈[-91,+50]mm · 8 of 33 slices shown (2 of 2)]
[im 3/33]
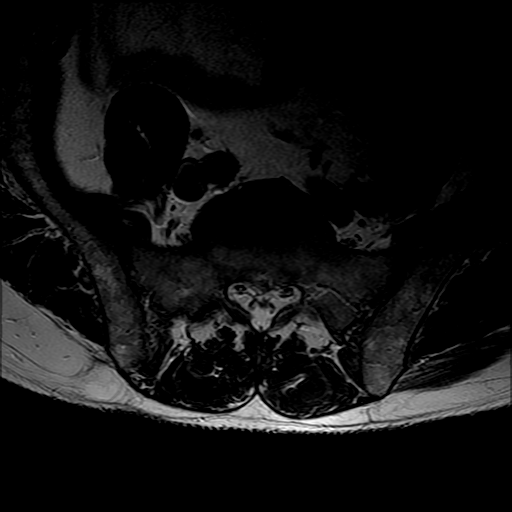
[im 5/33]
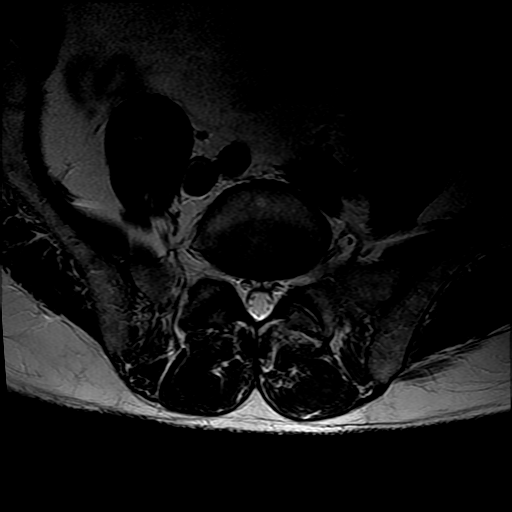
[im 7/33]
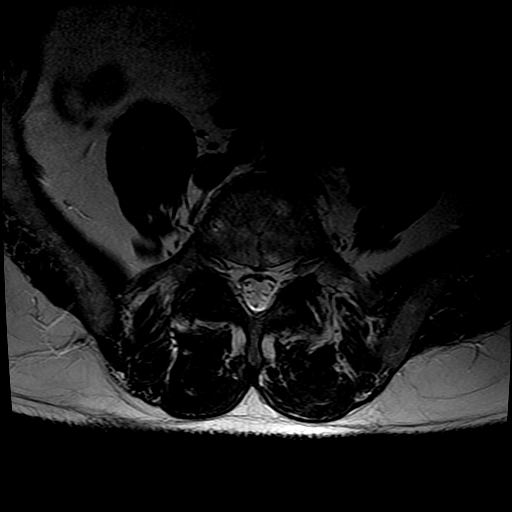
[im 11/33]
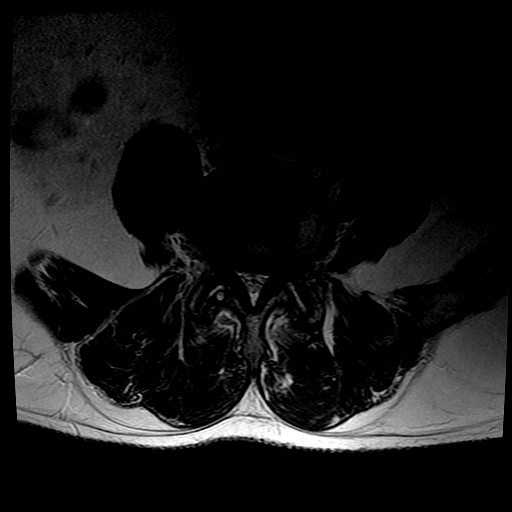
[im 15/33]
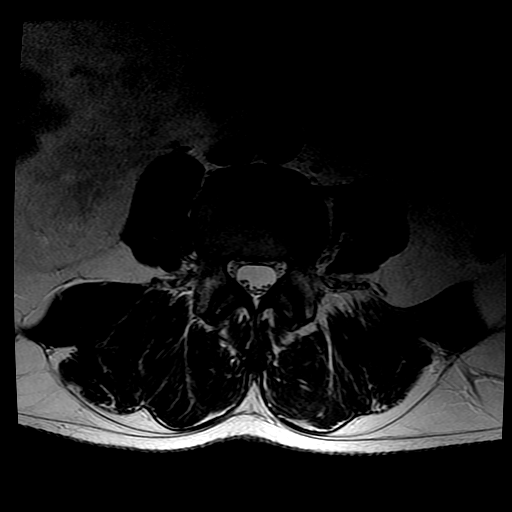
[im 18/33]
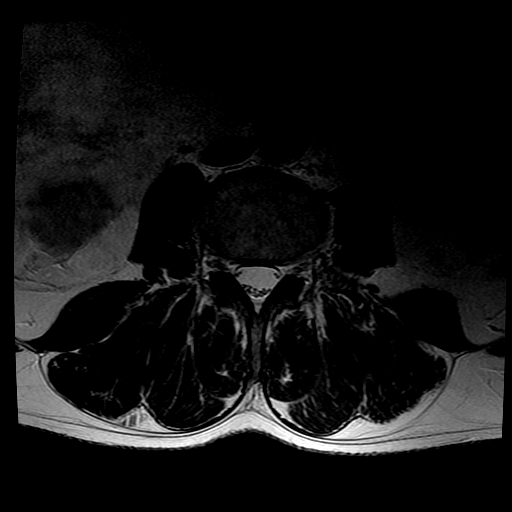
[im 20/33]
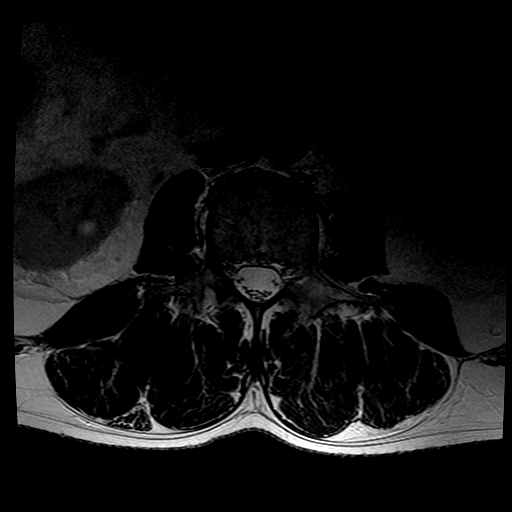
[im 28/33]
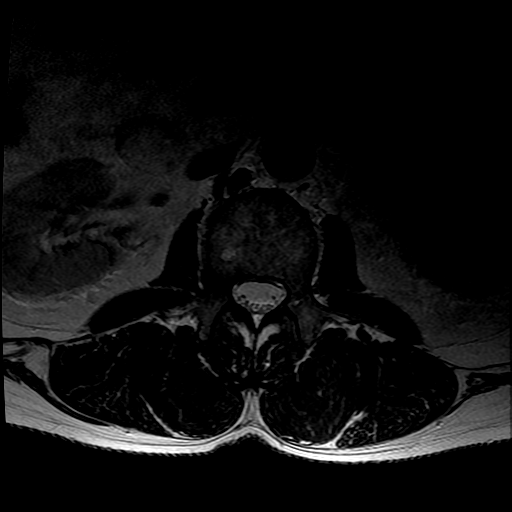

[Series 6: T1 · axial · 4.0mm · 0.39mm/px · z∈[-81,+50]mm · 3 of 33 slices shown (2 of 2)]
[im 5/33]
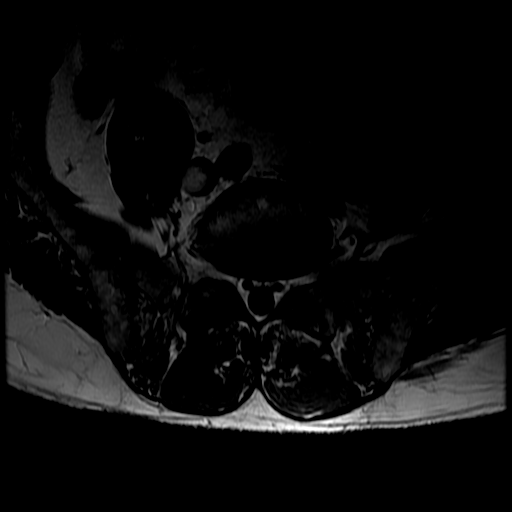
[im 18/33]
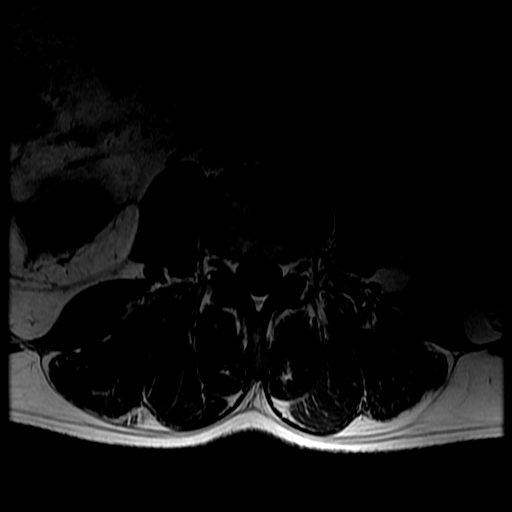
[im 28/33]
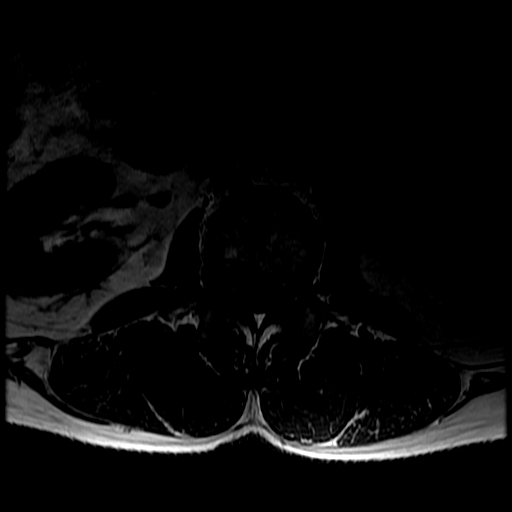

[19 of 48 positions shown; findings below may reference images not displayed]

FINDINGS: Vertebral body height is maintained with some small Schmorl's nodes
seen in the lower thoracic spine and superior endplate of L1. No
worrisome marrow lesion is present. 0.5 cm anterolisthesis L4 on L5
is due to facet arthropathy. The conus medullaris is normal in
signal and position. Imaged intra-abdominal contents are
unremarkable.

The T11-12 and T12-L1 levels are imaged in the sagittal plane only
and negative.

L1-2:  Negative.

L2-3:  Negative.

L3-4: There is facet degenerative disease. No disc bulge or
protrusion. The central canal and foramina are open.

L4-5: Advanced bilateral facet arthropathy is present. There is
ligamentum flavum thickening. The disc is uncovered with a mild
bulge. The central canal and lateral recesses are severely narrowed.
Mild to moderate left foraminal narrowing is seen. The right foramen
is open.

L5-S1: There is shallow disc bulge and some facet degenerative
change. The central canal and foramina are open.
IMPRESSION: Severe central canal and bilateral lateral recess stenosis at L4-5
where there is ligamentum flavum thickening and facet degenerative
disease results in 0.5 cm anterolisthesis.

## 2016-03-22 ENCOUNTER — Telehealth: Payer: Self-pay | Admitting: Internal Medicine

## 2016-03-22 NOTE — Telephone Encounter (Signed)
Pt called wondering if Dr. Camila Li can refer him to dermatologist due to spot on arms (look like wart). Please advise, does he need an appt first?

## 2016-03-23 NOTE — Telephone Encounter (Signed)
I can see it and probably remove it Thx

## 2016-03-24 NOTE — Telephone Encounter (Signed)
PCP will see appt and may be able to remove if pt agrees.   Can you and schedule for a 30 minutes slot if pt agress?

## 2016-04-14 ENCOUNTER — Encounter: Payer: Self-pay | Admitting: Internal Medicine

## 2016-04-14 ENCOUNTER — Ambulatory Visit (INDEPENDENT_AMBULATORY_CARE_PROVIDER_SITE_OTHER): Payer: 59 | Admitting: Internal Medicine

## 2016-04-14 DIAGNOSIS — D485 Neoplasm of uncertain behavior of skin: Secondary | ICD-10-CM

## 2016-04-14 DIAGNOSIS — M5416 Radiculopathy, lumbar region: Secondary | ICD-10-CM | POA: Diagnosis not present

## 2016-04-14 DIAGNOSIS — F411 Generalized anxiety disorder: Secondary | ICD-10-CM

## 2016-04-14 MED ORDER — ALPRAZOLAM 0.25 MG PO TABS: 0.2500 mg | ORAL_TABLET | Freq: Two times a day (BID) | ORAL | 2 refills | Status: DC | PRN

## 2016-04-14 NOTE — Progress Notes (Signed)
Subjective:  Patient ID: Joseph Preston, male    DOB: 23-Aug-1950  Age: 66 y.o. MRN: [id number]  CC: Skin Problem (left forearm, tags on both ears, ); Sinus Problem (fatigue, ST, post nasal drip, ); and Mouth Lesions (under tongue )   HPI Joseph Preston presents for anxiety, LBP C/o sinus congestion C/o L forearm bump  Outpatient Medications Prior to Visit  Medication Sig Dispense Refill  . ALPRAZolam (XANAX) 0.25 MG tablet Take 1 tablet (0.25 mg total) by mouth 2 (two) times daily as needed. for anxiety 90 tablet 2  . cyclobenzaprine (FLEXERIL) 5 MG tablet Take 1 tablet (5 mg total) by mouth 3 (three) times daily as needed for muscle spasms. 60 tablet 1  . hyoscyamine (LEVSIN, ANASPAZ) 0.125 MG tablet Take 1 tablet (0.125 mg total) by mouth every 6 (six) hours as needed. 100 tablet 1  . nortriptyline (PAMELOR) 10 MG capsule Take 1-2 capsules (10-20 mg total) by mouth at bedtime. 60 capsule 3  . pantoprazole (PROTONIX) 40 MG tablet Take 1 tablet (40 mg total) by mouth daily. 30 tablet 11  . triamcinolone (KENALOG) 0.1 % paste Use as directed 1 application in the mouth or throat 3 (three) times daily. Mouth ulcer 5 g 2   No facility-administered medications prior to visit.     ROS Review of Systems  Constitutional: Negative for appetite change, fatigue and unexpected weight change.  HENT: Negative for congestion, nosebleeds, sneezing, sore throat and trouble swallowing.   Eyes: Negative for itching and visual disturbance.  Respiratory: Negative for cough.   Cardiovascular: Negative for chest pain, palpitations and leg swelling.  Gastrointestinal: Negative for abdominal distention, blood in stool, diarrhea and nausea.  Genitourinary: Negative for frequency and hematuria.  Musculoskeletal: Negative for back pain, gait problem, joint swelling and neck pain.  Skin: Negative for rash.  Neurological: Negative for dizziness, tremors, speech difficulty and weakness.    Psychiatric/Behavioral: Negative for agitation, dysphoric mood and sleep disturbance. The patient is nervous/anxious.     Objective:  BP 116/68   Pulse 73   Temp 98.4 F (36.9 C) (Oral)   Resp 16   Ht 5\' 7"  (1.702 m)   Wt 157 lb (71.2 kg)   SpO2 97%   BMI 24.59 kg/m   BP Readings from Last 3 Encounters:  04/14/16 116/68  03/10/16 136/76  12/08/15 130/90    Wt Readings from Last 3 Encounters:  04/14/16 157 lb (71.2 kg)  03/10/16 161 lb 2 oz (73.1 kg)  12/08/15 167 lb (75.8 kg)    Physical Exam  Constitutional: He is oriented to person, place, and time. He appears well-developed. No distress.  NAD  HENT:  Mouth/Throat: Oropharynx is clear and moist.  Eyes: Conjunctivae are normal. Pupils are equal, round, and reactive to light.  Neck: Normal range of motion. No JVD present. No thyromegaly present.  Cardiovascular: Normal rate, regular rhythm, normal heart sounds and intact distal pulses.  Exam reveals no gallop and no friction rub.   No murmur heard. Pulmonary/Chest: Effort normal and breath sounds normal. No respiratory distress. He has no wheezes. He has no rales. He exhibits no tenderness.  Abdominal: Soft. Bowel sounds are normal. He exhibits no distension and no mass. There is no tenderness. There is no rebound and no guarding.  Musculoskeletal: Normal range of motion. He exhibits no edema or tenderness.  Lymphadenopathy:    He has no cervical adenopathy.  Neurological: He is alert and oriented to person, place, and time.  He has normal reflexes. No cranial nerve deficit. He exhibits normal muscle tone. He displays a negative Romberg sign. Coordination and gait normal.  Skin: Skin is warm and dry. No rash noted.  Psychiatric: He has a normal mood and affect. His behavior is normal. Judgment and thought content normal.  L forearm 11x11 mm nodule  Lab Results  Component Value Date   WBC 5.8 08/18/2015   HGB 14.3 08/18/2015   HCT 42.8 08/18/2015   PLT 207.0  08/18/2015   GLUCOSE 88 03/10/2016   CHOL 209 (H) 08/18/2015   TRIG 89.0 08/18/2015   HDL 60.60 08/18/2015   LDLCALC 131 (H) 08/18/2015   ALT 20 08/18/2015   AST 22 08/18/2015   NA 142 03/10/2016   K 4.9 03/10/2016   CL 106 03/10/2016   CREATININE 1.02 03/10/2016   BUN 18 03/10/2016   CO2 31 03/10/2016   TSH 1.06 06/02/2013   PSA 9.84 (H) 08/18/2015   HGBA1C 5.7 03/10/2016    Dg Facet Jt Neuro Destruct Sing L/s W/img Guide  Result Date: 09/21/2015 CLINICAL DATA:  Lumbosacral spondylosis without myelopathy. Symptomatic lumbago. EXAM: LEFT L4-5 FACET DENERVATION USING LEFT L3 AND LEFT L4 MEDIAL BRANCH RADIOFREQUENCY ABLATION FLUOROSCOPY TIME:  34 seconds corresponding to a Dose Area Product of 53.14 Gy*m2 TECHNIQUE: Informed written consent was obtained.  Time-out was performed. An appropriate skin entry site was determined fluoroscopically and marked. Operator donned sterile gloves and mask. Site prepped with betadine, draped in usual sterile fashion, and infiltrated locally with 1% lidocaine. A 10 cm, 20 gauge Stryker needle with a 10 mm active tip was placed at the junction of the LEFT L4 superior articular process and LEFT L4 transverse process, directed 1 mm ventrally over the notch, to lie along the course of the LEFT L3 medial branch. Needle tip was confirmed to be in good position fluoroscopically, dorsal to the foramen. Sensory and motor stimulation elicited concordant deep local pain without radicular symptoms or lower extremity fasciculation. After tandem needle placement to allow 1% lidocaine 1 mL around the needle tip, a radiofrequency burn was performed at 80 degrees centigrade for 90 seconds. Postprocedure 40 mg Depo-Medrol was instilled. An identical procedure was performed on the LEFT at the junction LEFT L5 superior articular process and LEFT L5 transverse process, along the course the LEFT L4 medial branch. Sensory and motor stimulation was carried out, eliciting deep local  pain without radicular symptoms. Again a radiofrequency burn was performed at 80 degrees centigrade for 90 seconds, with 40 mg Depo-Medrol postprocedure instilled around the burn site. IMPRESSION: 1. Technically successful LEFT L4-5 facet denervation. The patient was comfortable time of discharge. Electronically Signed   By: Staci Righter M.D.   On: 09/21/2015 10:13   Dg Facet Jt Neuro Destruct Ea Add L/s W/img Guide  Result Date: 09/21/2015 CLINICAL DATA:  Lumbosacral spondylosis without myelopathy. Symptomatic lumbago. EXAM: LEFT L4-5 FACET DENERVATION USING LEFT L3 AND LEFT L4 MEDIAL BRANCH RADIOFREQUENCY ABLATION FLUOROSCOPY TIME:  34 seconds corresponding to a Dose Area Product of 53.14 Gy*m2 TECHNIQUE: Informed written consent was obtained.  Time-out was performed. An appropriate skin entry site was determined fluoroscopically and marked. Operator donned sterile gloves and mask. Site prepped with betadine, draped in usual sterile fashion, and infiltrated locally with 1% lidocaine. A 10 cm, 20 gauge Stryker needle with a 10 mm active tip was placed at the junction of the LEFT L4 superior articular process and LEFT L4 transverse process, directed 1 mm ventrally over the  notch, to lie along the course of the LEFT L3 medial branch. Needle tip was confirmed to be in good position fluoroscopically, dorsal to the foramen. Sensory and motor stimulation elicited concordant deep local pain without radicular symptoms or lower extremity fasciculation. After tandem needle placement to allow 1% lidocaine 1 mL around the needle tip, a radiofrequency burn was performed at 80 degrees centigrade for 90 seconds. Postprocedure 40 mg Depo-Medrol was instilled. An identical procedure was performed on the LEFT at the junction LEFT L5 superior articular process and LEFT L5 transverse process, along the course the LEFT L4 medial branch. Sensory and motor stimulation was carried out, eliciting deep local pain without radicular  symptoms. Again a radiofrequency burn was performed at 80 degrees centigrade for 90 seconds, with 40 mg Depo-Medrol postprocedure instilled around the burn site. IMPRESSION: 1. Technically successful LEFT L4-5 facet denervation. The patient was comfortable time of discharge. Electronically Signed   By: Staci Righter M.D.   On: 09/21/2015 10:13    Assessment & Plan:   There are no diagnoses linked to this encounter. I am having Mr. Roy maintain his triamcinolone, pantoprazole, ALPRAZolam, hyoscyamine, cyclobenzaprine, and nortriptyline.  No orders of the defined types were placed in this encounter.    Follow-up: No Follow-up on file.  Walker Kehr, MD

## 2016-04-14 NOTE — Progress Notes (Signed)
Pre-visit discussion using our clinic review tool. No additional management support is needed unless otherwise documented below in the visit note.  

## 2016-04-14 NOTE — Assessment & Plan Note (Signed)
Better  Flexeril

## 2016-04-14 NOTE — Assessment & Plan Note (Signed)
Xanax prn  Potential benefits of a long term benzodiazepines  use as well as potential risks  and complications were explained to the patient and were aknowledged. 

## 2016-04-14 NOTE — Assessment & Plan Note (Signed)
L forearm 11x11 mm nodule - r/o cancer Skin bx

## 2016-05-02 ENCOUNTER — Other Ambulatory Visit: Payer: 59

## 2016-05-02 ENCOUNTER — Encounter: Payer: Self-pay | Admitting: Internal Medicine

## 2016-05-02 ENCOUNTER — Ambulatory Visit (INDEPENDENT_AMBULATORY_CARE_PROVIDER_SITE_OTHER): Payer: 59 | Admitting: Internal Medicine

## 2016-05-02 VITALS — BP 126/68 | HR 64 | Temp 97.6°F | Ht 67.0 in | Wt 160.0 lb

## 2016-05-02 DIAGNOSIS — L299 Pruritus, unspecified: Secondary | ICD-10-CM | POA: Diagnosis not present

## 2016-05-02 DIAGNOSIS — D485 Neoplasm of uncertain behavior of skin: Secondary | ICD-10-CM

## 2016-05-02 NOTE — Patient Instructions (Signed)
Postprocedure instructions :    A Band-Aid should be  changed twice daily. You can take a shower tomorrow.  Keep the wounds clean. You can wash them with liquid soap and water. Pat dry with gauze or a Kleenex tissue  Before applying antibiotic ointment and a Band-Aid.   You need to report immediately  if fever, chills or any signs of infection develop.    The biopsy results should be available in 1 -2 weeks. 

## 2016-05-02 NOTE — Assessment & Plan Note (Signed)
See procedure 

## 2016-05-02 NOTE — Addendum Note (Signed)
Addended by: Elta Guadeloupe on: 05/02/2016 02:58 PM   Modules accepted: Orders

## 2016-05-02 NOTE — Progress Notes (Signed)
Subjective:  Patient ID: Joseph Preston, male    DOB: 1950/05/25  Age: 66 y.o. MRN: [id number]  CC: No chief complaint on file.   HPI Joseph Preston presents for skin bx C/o B ear itching x wks  Outpatient Medications Prior to Visit  Medication Sig Dispense Refill  . ALPRAZolam (XANAX) 0.25 MG tablet Take 1 tablet (0.25 mg total) by mouth 2 (two) times daily as needed. for anxiety 90 tablet 2  . cyclobenzaprine (FLEXERIL) 5 MG tablet Take 1 tablet (5 mg total) by mouth 3 (three) times daily as needed for muscle spasms. 60 tablet 1  . hyoscyamine (LEVSIN, ANASPAZ) 0.125 MG tablet Take 1 tablet (0.125 mg total) by mouth every 6 (six) hours as needed. 100 tablet 1  . nortriptyline (PAMELOR) 10 MG capsule Take 1-2 capsules (10-20 mg total) by mouth at bedtime. 60 capsule 3  . pantoprazole (PROTONIX) 40 MG tablet Take 1 tablet (40 mg total) by mouth daily. 30 tablet 11  . triamcinolone (KENALOG) 0.1 % paste Use as directed 1 application in the mouth or throat 3 (three) times daily. Mouth ulcer 5 g 2   No facility-administered medications prior to visit.     ROS Review of Systems  Constitutional: Negative for appetite change.  HENT: Negative for congestion, nosebleeds, sinus pain, sneezing, sore throat and trouble swallowing.   Psychiatric/Behavioral: Negative for sleep disturbance.    Objective:  There were no vitals taken for this visit.  BP Readings from Last 3 Encounters:  04/14/16 116/68  03/10/16 136/76  12/08/15 130/90    Wt Readings from Last 3 Encounters:  04/14/16 157 lb (71.2 kg)  03/10/16 161 lb 2 oz (73.1 kg)  12/08/15 167 lb (75.8 kg)    Physical Exam  Constitutional: He appears well-developed.  HENT:  Nose: Nose normal.  Mouth/Throat: Oropharynx is clear and moist.  Eyes: Right eye exhibits no discharge. Left eye exhibits no discharge.  Musculoskeletal: He exhibits no edema.   B hair clippings in ears  Procedure - Excisional biopsy: Indication:  skin cancer Risks including bleeding, infection, incomplete removal and other were discussed. Consent was signed.  Lesion #1 on L forearm   measuring 11x11 mm   Procedure Note :     Procedure :  Skin biopsy   Indication:  Changing mole (s ),  Suspicious lesion(s)   Risks including unsuccessful procedure , bleeding, infection, bruising, scar, a need for another complete procedure and others were explained to the patient in detail as well as the benefits. Informed consent was obtained and signed.   The patient was placed in a decubitus position.  Lesion #1 on  L forearm   measuring  11x11   mm   Skin over lesion #1  was prepped with Betadine and alcohol  and anesthetized with 1 cc of 2% lidocaine and epinephrine, using a 25-gauge 1 inch needle.  Shave biopsy with a sterile Dermablade was carried out in the usual fashion. Hyfrecator was used to destroy the rest of the lesion potentially left behind and for hemostasis. Band-Aid was applied with antibiotic ointment.     Tolerated well. Complications none.      Postprocedure instructions :    A Band-Aid should be  changed twice daily. You can take a shower tomorrow.  Keep the wounds clean. You can wash them with liquid soap and water. Pat dry with gauze or a Kleenex tissue  Before applying antibiotic ointment and a Band-Aid.   You need to  report immediately  if fever, chills or any signs of infection develop.    The biopsy results should be available in 1 -2 weeks.      Lab Results  Component Value Date   WBC 5.8 08/18/2015   HGB 14.3 08/18/2015   HCT 42.8 08/18/2015   PLT 207.0 08/18/2015   GLUCOSE 88 03/10/2016   CHOL 209 (H) 08/18/2015   TRIG 89.0 08/18/2015   HDL 60.60 08/18/2015   LDLCALC 131 (H) 08/18/2015   ALT 20 08/18/2015   AST 22 08/18/2015   NA 142 03/10/2016   K 4.9 03/10/2016   CL 106 03/10/2016   CREATININE 1.02 03/10/2016   BUN 18 03/10/2016   CO2 31 03/10/2016   TSH 1.06 06/02/2013   PSA 9.84 (H)  08/18/2015   HGBA1C 5.7 03/10/2016    Dg Facet Jt Neuro Destruct Sing L/s W/img Guide  Result Date: 09/21/2015 CLINICAL DATA:  Lumbosacral spondylosis without myelopathy. Symptomatic lumbago. EXAM: LEFT L4-5 FACET DENERVATION USING LEFT L3 AND LEFT L4 MEDIAL BRANCH RADIOFREQUENCY ABLATION FLUOROSCOPY TIME:  34 seconds corresponding to a Dose Area Product of 53.14 Gy*m2 TECHNIQUE: Informed written consent was obtained.  Time-out was performed. An appropriate skin entry site was determined fluoroscopically and marked. Operator donned sterile gloves and mask. Site prepped with betadine, draped in usual sterile fashion, and infiltrated locally with 1% lidocaine. A 10 cm, 20 gauge Stryker needle with a 10 mm active tip was placed at the junction of the LEFT L4 superior articular process and LEFT L4 transverse process, directed 1 mm ventrally over the notch, to lie along the course of the LEFT L3 medial branch. Needle tip was confirmed to be in good position fluoroscopically, dorsal to the foramen. Sensory and motor stimulation elicited concordant deep local pain without radicular symptoms or lower extremity fasciculation. After tandem needle placement to allow 1% lidocaine 1 mL around the needle tip, a radiofrequency burn was performed at 80 degrees centigrade for 90 seconds. Postprocedure 40 mg Depo-Medrol was instilled. An identical procedure was performed on the LEFT at the junction LEFT L5 superior articular process and LEFT L5 transverse process, along the course the LEFT L4 medial branch. Sensory and motor stimulation was carried out, eliciting deep local pain without radicular symptoms. Again a radiofrequency burn was performed at 80 degrees centigrade for 90 seconds, with 40 mg Depo-Medrol postprocedure instilled around the burn site. IMPRESSION: 1. Technically successful LEFT L4-5 facet denervation. The patient was comfortable time of discharge. Electronically Signed   By: Staci Righter M.D.   On:  09/21/2015 10:13   Dg Facet Jt Neuro Destruct Ea Add L/s W/img Guide  Result Date: 09/21/2015 CLINICAL DATA:  Lumbosacral spondylosis without myelopathy. Symptomatic lumbago. EXAM: LEFT L4-5 FACET DENERVATION USING LEFT L3 AND LEFT L4 MEDIAL BRANCH RADIOFREQUENCY ABLATION FLUOROSCOPY TIME:  34 seconds corresponding to a Dose Area Product of 53.14 Gy*m2 TECHNIQUE: Informed written consent was obtained.  Time-out was performed. An appropriate skin entry site was determined fluoroscopically and marked. Operator donned sterile gloves and mask. Site prepped with betadine, draped in usual sterile fashion, and infiltrated locally with 1% lidocaine. A 10 cm, 20 gauge Stryker needle with a 10 mm active tip was placed at the junction of the LEFT L4 superior articular process and LEFT L4 transverse process, directed 1 mm ventrally over the notch, to lie along the course of the LEFT L3 medial branch. Needle tip was confirmed to be in good position fluoroscopically, dorsal to the foramen. Sensory and  motor stimulation elicited concordant deep local pain without radicular symptoms or lower extremity fasciculation. After tandem needle placement to allow 1% lidocaine 1 mL around the needle tip, a radiofrequency burn was performed at 80 degrees centigrade for 90 seconds. Postprocedure 40 mg Depo-Medrol was instilled. An identical procedure was performed on the LEFT at the junction LEFT L5 superior articular process and LEFT L5 transverse process, along the course the LEFT L4 medial branch. Sensory and motor stimulation was carried out, eliciting deep local pain without radicular symptoms. Again a radiofrequency burn was performed at 80 degrees centigrade for 90 seconds, with 40 mg Depo-Medrol postprocedure instilled around the burn site. IMPRESSION: 1. Technically successful LEFT L4-5 facet denervation. The patient was comfortable time of discharge. Electronically Signed   By: Staci Righter M.D.   On: 09/21/2015 10:13     Assessment & Plan:   Diagnoses and all orders for this visit:  Neoplasm of uncertain behavior of skin   I am having Mr. Snelgrove maintain his triamcinolone, pantoprazole, hyoscyamine, cyclobenzaprine, nortriptyline, and ALPRAZolam.  No orders of the defined types were placed in this encounter.    Follow-up: No Follow-up on file.  Walker Kehr, MD

## 2016-05-02 NOTE — Assessment & Plan Note (Signed)
B hair clippings Irrigate at home

## 2016-06-09 ENCOUNTER — Ambulatory Visit: Payer: 59 | Admitting: Internal Medicine

## 2016-08-13 ENCOUNTER — Other Ambulatory Visit: Payer: Self-pay | Admitting: Internal Medicine

## 2016-10-06 ENCOUNTER — Ambulatory Visit: Payer: 59 | Admitting: Internal Medicine

## 2016-10-10 ENCOUNTER — Encounter: Payer: Self-pay | Admitting: Internal Medicine

## 2016-10-10 ENCOUNTER — Ambulatory Visit (INDEPENDENT_AMBULATORY_CARE_PROVIDER_SITE_OTHER): Payer: 59 | Admitting: Internal Medicine

## 2016-10-10 DIAGNOSIS — R7309 Other abnormal glucose: Secondary | ICD-10-CM | POA: Diagnosis not present

## 2016-10-10 DIAGNOSIS — M545 Low back pain: Secondary | ICD-10-CM

## 2016-10-10 DIAGNOSIS — G4700 Insomnia, unspecified: Secondary | ICD-10-CM

## 2016-10-10 DIAGNOSIS — M79605 Pain in left leg: Secondary | ICD-10-CM

## 2016-10-10 DIAGNOSIS — K5909 Other constipation: Secondary | ICD-10-CM | POA: Diagnosis not present

## 2016-10-10 DIAGNOSIS — R972 Elevated prostate specific antigen [PSA]: Secondary | ICD-10-CM | POA: Diagnosis not present

## 2016-10-10 DIAGNOSIS — N320 Bladder-neck obstruction: Secondary | ICD-10-CM | POA: Insufficient documentation

## 2016-10-10 DIAGNOSIS — F411 Generalized anxiety disorder: Secondary | ICD-10-CM | POA: Diagnosis not present

## 2016-10-10 MED ORDER — POLYETHYLENE GLYCOL 3350 17 GM/SCOOP PO POWD: 17.0000 g | Freq: Two times a day (BID) | ORAL | 2 refills | Status: DC | PRN

## 2016-10-10 MED ORDER — NORTRIPTYLINE HCL 10 MG PO CAPS: 10.0000 mg | ORAL_CAPSULE | Freq: Every day | ORAL | 3 refills | Status: DC

## 2016-10-10 MED ORDER — ALPRAZOLAM 0.5 MG PO TABS: 0.5000 mg | ORAL_TABLET | Freq: Two times a day (BID) | ORAL | 2 refills | Status: DC | PRN

## 2016-10-10 MED ORDER — TAMSULOSIN HCL 0.4 MG PO CAPS
0.4000 mg | ORAL_CAPSULE | Freq: Every day | ORAL | 11 refills | Status: DC
Start: 2016-10-10 — End: 2018-11-25

## 2016-10-10 MED ORDER — VITAMIN D 50 MCG (2000 UT) PO CAPS: ORAL_CAPSULE | ORAL | 3 refills | Status: AC

## 2016-10-10 MED ORDER — CYCLOBENZAPRINE HCL 5 MG PO TABS
5.0000 mg | ORAL_TABLET | Freq: Two times a day (BID) | ORAL | 1 refills | Status: DC | PRN
Start: 2016-10-10 — End: 2017-02-26

## 2016-10-10 MED ORDER — ALPRAZOLAM 0.5 MG PO TBDP: 0.5000 mg | ORAL_TABLET | Freq: Two times a day (BID) | ORAL | 1 refills | Status: DC | PRN

## 2016-10-10 NOTE — Assessment & Plan Note (Addendum)
Flexeril prn  

## 2016-10-10 NOTE — Assessment & Plan Note (Signed)
F/u w/Urology S/p prostate bx - ok 2015

## 2016-10-10 NOTE — Progress Notes (Signed)
Subjective:  Patient ID: Joseph Preston, male    DOB: 1950-10-15  Age: 66 y.o. MRN: [id number]  CC: No chief complaint on file.   HPI Joseph Preston presents for anxiety, LBP, GERD f/u C/o chronic constipation C/o BPH sx's - worse  Outpatient Medications Prior to Visit  Medication Sig Dispense Refill  . ALPRAZolam (XANAX) 0.25 MG tablet Take 1 tablet (0.25 mg total) by mouth 2 (two) times daily as needed. for anxiety 90 tablet 2  . cyclobenzaprine (FLEXERIL) 5 MG tablet TAKE 1 TABLET BY MOUTH THREE TIMES DAILY AS NEEDED FOR MUSCLE SPASM 60 tablet 1  . hyoscyamine (LEVSIN, ANASPAZ) 0.125 MG tablet Take 1 tablet (0.125 mg total) by mouth every 6 (six) hours as needed. 100 tablet 1  . nortriptyline (PAMELOR) 10 MG capsule Take 1-2 capsules (10-20 mg total) by mouth at bedtime. 60 capsule 3  . pantoprazole (PROTONIX) 40 MG tablet Take 1 tablet (40 mg total) by mouth daily. 30 tablet 11  . triamcinolone (KENALOG) 0.1 % paste Use as directed 1 application in the mouth or throat 3 (three) times daily. Mouth ulcer (Patient not taking: Reported on 10/10/2016) 5 g 2   No facility-administered medications prior to visit.     ROS Review of Systems  Constitutional: Negative for appetite change, fatigue and unexpected weight change.  HENT: Negative for congestion, nosebleeds, sneezing, sore throat and trouble swallowing.   Eyes: Negative for itching and visual disturbance.  Respiratory: Negative for cough.   Cardiovascular: Negative for chest pain, palpitations and leg swelling.  Gastrointestinal: Negative for abdominal distention, blood in stool, diarrhea and nausea.  Genitourinary: Negative for frequency and hematuria.  Musculoskeletal: Positive for arthralgias. Negative for back pain, gait problem, joint swelling and neck pain.  Skin: Negative for rash.  Neurological: Negative for dizziness, tremors, speech difficulty and weakness.  Psychiatric/Behavioral: Negative for agitation,  dysphoric mood, sleep disturbance and suicidal ideas. The patient is not nervous/anxious.     Objective:  BP 130/82 (BP Location: Left Arm, Patient Position: Sitting, Cuff Size: Normal)   Pulse 64   Temp 97.8 F (36.6 C) (Oral)   Ht 1' (0.305 m)   Wt 162 lb (73.5 kg)   SpO2 99%   BMI 790.96 kg/m   BP Readings from Last 3 Encounters:  10/10/16 130/82  05/02/16 126/68  04/14/16 116/68    Wt Readings from Last 3 Encounters:  10/10/16 162 lb (73.5 kg)  05/02/16 160 lb (72.6 kg)  04/14/16 157 lb (71.2 kg)    Physical Exam  Constitutional: He is oriented to person, place, and time. He appears well-developed. No distress.  NAD  HENT:  Mouth/Throat: Oropharynx is clear and moist.  Eyes: Pupils are equal, round, and reactive to light. Conjunctivae are normal.  Neck: Normal range of motion. No JVD present. No thyromegaly present.  Cardiovascular: Normal rate, regular rhythm, normal heart sounds and intact distal pulses.  Exam reveals no gallop and no friction rub.   No murmur heard. Pulmonary/Chest: Effort normal and breath sounds normal. No respiratory distress. He has no wheezes. He has no rales. He exhibits no tenderness.  Abdominal: Soft. Bowel sounds are normal. He exhibits no distension and no mass. There is no tenderness. There is no rebound and no guarding.  Musculoskeletal: Normal range of motion. He exhibits tenderness. He exhibits no edema.  Lymphadenopathy:    He has no cervical adenopathy.  Neurological: He is alert and oriented to person, place, and time. He has normal reflexes.  No cranial nerve deficit. He exhibits normal muscle tone. He displays a negative Romberg sign. Coordination and gait normal.  Skin: Skin is warm and dry. No rash noted.  Psychiatric: He has a normal mood and affect. His behavior is normal. Judgment and thought content normal.    Lab Results  Component Value Date   WBC 5.8 08/18/2015   HGB 14.3 08/18/2015   HCT 42.8 08/18/2015   PLT 207.0  08/18/2015   GLUCOSE 88 03/10/2016   CHOL 209 (H) 08/18/2015   TRIG 89.0 08/18/2015   HDL 60.60 08/18/2015   LDLCALC 131 (H) 08/18/2015   ALT 20 08/18/2015   AST 22 08/18/2015   NA 142 03/10/2016   K 4.9 03/10/2016   CL 106 03/10/2016   CREATININE 1.02 03/10/2016   BUN 18 03/10/2016   CO2 31 03/10/2016   TSH 1.06 06/02/2013   PSA 9.84 (H) 08/18/2015   HGBA1C 5.7 03/10/2016    Dg Facet Jt Neuro Destruct Sing L/s W/img Guide  Result Date: 09/21/2015 CLINICAL DATA:  Lumbosacral spondylosis without myelopathy. Symptomatic lumbago. EXAM: LEFT L4-5 FACET DENERVATION USING LEFT L3 AND LEFT L4 MEDIAL BRANCH RADIOFREQUENCY ABLATION FLUOROSCOPY TIME:  34 seconds corresponding to a Dose Area Product of 53.14 Gy*m2 TECHNIQUE: Informed written consent was obtained.  Time-out was performed. An appropriate skin entry site was determined fluoroscopically and marked. Operator donned sterile gloves and mask. Site prepped with betadine, draped in usual sterile fashion, and infiltrated locally with 1% lidocaine. A 10 cm, 20 gauge Stryker needle with a 10 mm active tip was placed at the junction of the LEFT L4 superior articular process and LEFT L4 transverse process, directed 1 mm ventrally over the notch, to lie along the course of the LEFT L3 medial branch. Needle tip was confirmed to be in good position fluoroscopically, dorsal to the foramen. Sensory and motor stimulation elicited concordant deep local pain without radicular symptoms or lower extremity fasciculation. After tandem needle placement to allow 1% lidocaine 1 mL around the needle tip, a radiofrequency burn was performed at 80 degrees centigrade for 90 seconds. Postprocedure 40 mg Depo-Medrol was instilled. An identical procedure was performed on the LEFT at the junction LEFT L5 superior articular process and LEFT L5 transverse process, along the course the LEFT L4 medial branch. Sensory and motor stimulation was carried out, eliciting deep local  pain without radicular symptoms. Again a radiofrequency burn was performed at 80 degrees centigrade for 90 seconds, with 40 mg Depo-Medrol postprocedure instilled around the burn site. IMPRESSION: 1. Technically successful LEFT L4-5 facet denervation. The patient was comfortable time of discharge. Electronically Signed   By: Staci Righter M.D.   On: 09/21/2015 10:13   Dg Facet Jt Neuro Destruct Ea Add L/s W/img Guide  Result Date: 09/21/2015 CLINICAL DATA:  Lumbosacral spondylosis without myelopathy. Symptomatic lumbago. EXAM: LEFT L4-5 FACET DENERVATION USING LEFT L3 AND LEFT L4 MEDIAL BRANCH RADIOFREQUENCY ABLATION FLUOROSCOPY TIME:  34 seconds corresponding to a Dose Area Product of 53.14 Gy*m2 TECHNIQUE: Informed written consent was obtained.  Time-out was performed. An appropriate skin entry site was determined fluoroscopically and marked. Operator donned sterile gloves and mask. Site prepped with betadine, draped in usual sterile fashion, and infiltrated locally with 1% lidocaine. A 10 cm, 20 gauge Stryker needle with a 10 mm active tip was placed at the junction of the LEFT L4 superior articular process and LEFT L4 transverse process, directed 1 mm ventrally over the notch, to lie along the course of the  LEFT L3 medial branch. Needle tip was confirmed to be in good position fluoroscopically, dorsal to the foramen. Sensory and motor stimulation elicited concordant deep local pain without radicular symptoms or lower extremity fasciculation. After tandem needle placement to allow 1% lidocaine 1 mL around the needle tip, a radiofrequency burn was performed at 80 degrees centigrade for 90 seconds. Postprocedure 40 mg Depo-Medrol was instilled. An identical procedure was performed on the LEFT at the junction LEFT L5 superior articular process and LEFT L5 transverse process, along the course the LEFT L4 medial branch. Sensory and motor stimulation was carried out, eliciting deep local pain without radicular  symptoms. Again a radiofrequency burn was performed at 80 degrees centigrade for 90 seconds, with 40 mg Depo-Medrol postprocedure instilled around the burn site. IMPRESSION: 1. Technically successful LEFT L4-5 facet denervation. The patient was comfortable time of discharge. Electronically Signed   By: Staci Righter M.D.   On: 09/21/2015 10:13    Assessment & Plan:   There are no diagnoses linked to this encounter. I have discontinued Mr. Gullett triamcinolone. I am also having him maintain his pantoprazole, hyoscyamine, nortriptyline, ALPRAZolam, and cyclobenzaprine.  No orders of the defined types were placed in this encounter.    Follow-up: No Follow-up on file.  Walker Kehr, MD

## 2016-10-10 NOTE — Assessment & Plan Note (Signed)
Nortriptyline at hs 

## 2016-10-10 NOTE — Assessment & Plan Note (Signed)
Monitoring

## 2016-10-10 NOTE — Assessment & Plan Note (Signed)
Miralax prn 

## 2016-10-10 NOTE — Assessment & Plan Note (Signed)
Flomax  qd

## 2016-10-10 NOTE — Assessment & Plan Note (Signed)
Chronic  Potential benefits of a long term benzodiazepines  use as well as potential risks  and complications were explained to the patient and were aknowledged. 

## 2017-01-17 ENCOUNTER — Ambulatory Visit (INDEPENDENT_AMBULATORY_CARE_PROVIDER_SITE_OTHER): Payer: 59 | Admitting: Internal Medicine

## 2017-01-17 ENCOUNTER — Encounter: Payer: Self-pay | Admitting: Internal Medicine

## 2017-01-17 DIAGNOSIS — R972 Elevated prostate specific antigen [PSA]: Secondary | ICD-10-CM

## 2017-01-17 DIAGNOSIS — N320 Bladder-neck obstruction: Secondary | ICD-10-CM | POA: Diagnosis not present

## 2017-01-17 DIAGNOSIS — F411 Generalized anxiety disorder: Secondary | ICD-10-CM

## 2017-01-17 MED ORDER — ALPRAZOLAM 0.5 MG PO TABS
0.5000 mg | ORAL_TABLET | Freq: Two times a day (BID) | ORAL | 2 refills | Status: DC | PRN
Start: 2017-01-17 — End: 2017-08-28

## 2017-01-17 NOTE — Progress Notes (Signed)
Subjective:  Patient ID: Joseph Preston, male    DOB: [birth year]  Age: 66 y.o. MRN: [id number]  CC: No chief complaint on file.   HPI Joseph Preston presents for urinary frequency, GERD and anxiety  Outpatient Medications Prior to Visit  Medication Sig Dispense Refill  . ALPRAZolam (XANAX) 0.5 MG tablet Take 1 tablet (0.5 mg total) by mouth 2 (two) times daily as needed for anxiety. 60 tablet 2  . Cholecalciferol (VITAMIN D) 2000 units CAPS 1 po qd 100 capsule 3  . cyclobenzaprine (FLEXERIL) 5 MG tablet Take 1 tablet (5 mg total) by mouth 2 (two) times daily as needed. for muscle spams 60 tablet 1  . hyoscyamine (LEVSIN, ANASPAZ) 0.125 MG tablet Take 1 tablet (0.125 mg total) by mouth every 6 (six) hours as needed. 100 tablet 1  . nortriptyline (PAMELOR) 10 MG capsule Take 1-2 capsules (10-20 mg total) by mouth at bedtime. 60 capsule 3  . pantoprazole (PROTONIX) 40 MG tablet Take 1 tablet (40 mg total) by mouth daily. 30 tablet 11  . polyethylene glycol powder (GLYCOLAX/MIRALAX) powder Take 17 g by mouth 2 (two) times daily as needed. 500 g 2  . tamsulosin (FLOMAX) 0.4 MG CAPS capsule Take 1 capsule (0.4 mg total) by mouth daily. 30 capsule 11   No facility-administered medications prior to visit.     ROS Review of Systems  Constitutional: Negative for appetite change, fatigue and unexpected weight change.  HENT: Negative for congestion, nosebleeds, sneezing, sore throat and trouble swallowing.   Eyes: Negative for itching and visual disturbance.  Respiratory: Negative for cough.   Cardiovascular: Negative for chest pain, palpitations and leg swelling.  Gastrointestinal: Negative for abdominal distention, blood in stool, diarrhea and nausea.  Genitourinary: Positive for frequency. Negative for hematuria.  Musculoskeletal: Negative for back pain, gait problem, joint swelling and neck pain.  Skin: Negative for rash.  Neurological: Negative for dizziness, tremors, speech  difficulty and weakness.  Psychiatric/Behavioral: Positive for decreased concentration. Negative for agitation, dysphoric mood and sleep disturbance. The patient is nervous/anxious.     Objective:  BP 128/82 (BP Location: Left Arm, Patient Position: Sitting, Cuff Size: Large)   Pulse 69   Temp 98.3 F (36.8 C) (Oral)   Ht 5\' 7"  (1.702 m)   Wt 160 lb (72.6 kg)   SpO2 99%   BMI 25.06 kg/m   BP Readings from Last 3 Encounters:  01/17/17 128/82  10/10/16 130/82  05/02/16 126/68    Wt Readings from Last 3 Encounters:  01/17/17 160 lb (72.6 kg)  10/10/16 162 lb (73.5 kg)  05/02/16 160 lb (72.6 kg)    Physical Exam  Constitutional: He is oriented to person, place, and time. He appears well-developed. No distress.  NAD  HENT:  Mouth/Throat: Oropharynx is clear and moist.  Eyes: Conjunctivae are normal. Pupils are equal, round, and reactive to light.  Neck: Normal range of motion. No JVD present. No thyromegaly present.  Cardiovascular: Normal rate, regular rhythm, normal heart sounds and intact distal pulses. Exam reveals no gallop and no friction rub.  No murmur heard. Pulmonary/Chest: Effort normal and breath sounds normal. No respiratory distress. He has no wheezes. He has no rales. He exhibits no tenderness.  Abdominal: Soft. Bowel sounds are normal. He exhibits no distension and no mass. There is no tenderness. There is no rebound and no guarding.  Musculoskeletal: Normal range of motion. He exhibits no edema or tenderness.  Lymphadenopathy:    He has no cervical  adenopathy.  Neurological: He is alert and oriented to person, place, and time. He has normal reflexes. No cranial nerve deficit. He exhibits normal muscle tone. He displays a negative Romberg sign. Coordination and gait normal.  Skin: Skin is warm and dry. No rash noted.  Psychiatric: He has a normal mood and affect. His behavior is normal. Judgment and thought content normal.    Lab Results  Component Value Date    WBC 5.8 08/18/2015   HGB 14.3 08/18/2015   HCT 42.8 08/18/2015   PLT 207.0 08/18/2015   GLUCOSE 88 03/10/2016   CHOL 209 (H) 08/18/2015   TRIG 89.0 08/18/2015   HDL 60.60 08/18/2015   LDLCALC 131 (H) 08/18/2015   ALT 20 08/18/2015   AST 22 08/18/2015   NA 142 03/10/2016   K 4.9 03/10/2016   CL 106 03/10/2016   CREATININE 1.02 03/10/2016   BUN 18 03/10/2016   CO2 31 03/10/2016   TSH 1.06 06/02/2013   PSA 9.84 (H) 08/18/2015   HGBA1C 5.7 03/10/2016    Dg Facet Jt Neuro Destruct Sing L/s W/img Guide  Result Date: 09/21/2015 CLINICAL DATA:  Lumbosacral spondylosis without myelopathy. Symptomatic lumbago. EXAM: LEFT L4-5 FACET DENERVATION USING LEFT L3 AND LEFT L4 MEDIAL BRANCH RADIOFREQUENCY ABLATION FLUOROSCOPY TIME:  34 seconds corresponding to a Dose Area Product of 53.14 Gy*m2 TECHNIQUE: Informed written consent was obtained.  Time-out was performed. An appropriate skin entry site was determined fluoroscopically and marked. Operator donned sterile gloves and mask. Site prepped with betadine, draped in usual sterile fashion, and infiltrated locally with 1% lidocaine. A 10 cm, 20 gauge Stryker needle with a 10 mm active tip was placed at the junction of the LEFT L4 superior articular process and LEFT L4 transverse process, directed 1 mm ventrally over the notch, to lie along the course of the LEFT L3 medial branch. Needle tip was confirmed to be in good position fluoroscopically, dorsal to the foramen. Sensory and motor stimulation elicited concordant deep local pain without radicular symptoms or lower extremity fasciculation. After tandem needle placement to allow 1% lidocaine 1 mL around the needle tip, a radiofrequency burn was performed at 80 degrees centigrade for 90 seconds. Postprocedure 40 mg Depo-Medrol was instilled. An identical procedure was performed on the LEFT at the junction LEFT L5 superior articular process and LEFT L5 transverse process, along the course the LEFT L4  medial branch. Sensory and motor stimulation was carried out, eliciting deep local pain without radicular symptoms. Again a radiofrequency burn was performed at 80 degrees centigrade for 90 seconds, with 40 mg Depo-Medrol postprocedure instilled around the burn site. IMPRESSION: 1. Technically successful LEFT L4-5 facet denervation. The patient was comfortable time of discharge. Electronically Signed   By: Staci Righter M.D.   On: 09/21/2015 10:13   Dg Facet Jt Neuro Destruct Ea Add L/s W/img Guide  Result Date: 09/21/2015 CLINICAL DATA:  Lumbosacral spondylosis without myelopathy. Symptomatic lumbago. EXAM: LEFT L4-5 FACET DENERVATION USING LEFT L3 AND LEFT L4 MEDIAL BRANCH RADIOFREQUENCY ABLATION FLUOROSCOPY TIME:  34 seconds corresponding to a Dose Area Product of 53.14 Gy*m2 TECHNIQUE: Informed written consent was obtained.  Time-out was performed. An appropriate skin entry site was determined fluoroscopically and marked. Operator donned sterile gloves and mask. Site prepped with betadine, draped in usual sterile fashion, and infiltrated locally with 1% lidocaine. A 10 cm, 20 gauge Stryker needle with a 10 mm active tip was placed at the junction of the LEFT L4 superior articular process and LEFT  L4 transverse process, directed 1 mm ventrally over the notch, to lie along the course of the LEFT L3 medial branch. Needle tip was confirmed to be in good position fluoroscopically, dorsal to the foramen. Sensory and motor stimulation elicited concordant deep local pain without radicular symptoms or lower extremity fasciculation. After tandem needle placement to allow 1% lidocaine 1 mL around the needle tip, a radiofrequency burn was performed at 80 degrees centigrade for 90 seconds. Postprocedure 40 mg Depo-Medrol was instilled. An identical procedure was performed on the LEFT at the junction LEFT L5 superior articular process and LEFT L5 transverse process, along the course the LEFT L4 medial branch. Sensory and  motor stimulation was carried out, eliciting deep local pain without radicular symptoms. Again a radiofrequency burn was performed at 80 degrees centigrade for 90 seconds, with 40 mg Depo-Medrol postprocedure instilled around the burn site. IMPRESSION: 1. Technically successful LEFT L4-5 facet denervation. The patient was comfortable time of discharge. Electronically Signed   By: Staci Righter M.D.   On: 09/21/2015 10:13    Assessment & Plan:   There are no diagnoses linked to this encounter. I am having Issa L. Rowe Robert maintain his pantoprazole, hyoscyamine, Vitamin D, nortriptyline, cyclobenzaprine, tamsulosin, polyethylene glycol powder, and ALPRAZolam.  No orders of the defined types were placed in this encounter.    Follow-up: No Follow-up on file.  Walker Kehr, MD

## 2017-01-17 NOTE — Assessment & Plan Note (Signed)
PSA per Urology 

## 2017-01-17 NOTE — Assessment & Plan Note (Signed)
-   Flomax 

## 2017-01-17 NOTE — Assessment & Plan Note (Signed)
Chronic   Potential benefits of a long term benzodiazepines  use as well as potential risks  and complications were explained to the patient and were aknowledged. Rx Xanax

## 2017-02-08 ENCOUNTER — Telehealth: Payer: Self-pay | Admitting: Internal Medicine

## 2017-02-08 DIAGNOSIS — M545 Low back pain, unspecified: Secondary | ICD-10-CM

## 2017-02-08 DIAGNOSIS — M79605 Pain in left leg: Principal | ICD-10-CM

## 2017-02-08 NOTE — Telephone Encounter (Signed)
Please advise 

## 2017-02-08 NOTE — Telephone Encounter (Signed)
Copied from [place] [id number]. Topic: Quick Communication - See Telephone Encounter >> Feb 08, 2017 10:35 AM Percell Belt A wrote: CRM for notification. See Telephone encounter for: pt wife called in and would like to know if dr plot could put in an order for the epidural that he had about a year ago.  He has spoken to [facility] about this recently and he is hurting so bad it is hard to work   [employer] number  [phone]   02/08/17.

## 2017-02-09 NOTE — Telephone Encounter (Signed)
He was ref by Dr Tamala Julian. Was it Dr Jola Baptist on 09/21/15 at [place]? Thx

## 2017-02-14 NOTE — Telephone Encounter (Signed)
Spoke with Dr. Tamala Julian and he suggested to order the same as from 09/03/15 at [place]

## 2017-02-15 NOTE — Telephone Encounter (Signed)
Done. Thank you.

## 2017-02-26 ENCOUNTER — Other Ambulatory Visit: Payer: Self-pay | Admitting: Internal Medicine

## 2017-02-26 ENCOUNTER — Other Ambulatory Visit: Payer: Self-pay | Admitting: *Deleted

## 2017-02-26 DIAGNOSIS — M5416 Radiculopathy, lumbar region: Secondary | ICD-10-CM

## 2017-03-06 ENCOUNTER — Other Ambulatory Visit: Payer: Self-pay | Admitting: Internal Medicine

## 2017-03-06 DIAGNOSIS — M4807 Spinal stenosis, lumbosacral region: Secondary | ICD-10-CM

## 2017-03-29 ENCOUNTER — Encounter: Payer: Self-pay | Admitting: Internal Medicine

## 2017-04-17 ENCOUNTER — Encounter: Payer: Self-pay | Admitting: Internal Medicine

## 2017-04-17 ENCOUNTER — Ambulatory Visit (INDEPENDENT_AMBULATORY_CARE_PROVIDER_SITE_OTHER): Payer: 59 | Admitting: Internal Medicine

## 2017-04-17 ENCOUNTER — Telehealth: Payer: Self-pay | Admitting: Internal Medicine

## 2017-04-17 DIAGNOSIS — R972 Elevated prostate specific antigen [PSA]: Secondary | ICD-10-CM

## 2017-04-17 DIAGNOSIS — M545 Low back pain, unspecified: Secondary | ICD-10-CM

## 2017-04-17 DIAGNOSIS — G4700 Insomnia, unspecified: Secondary | ICD-10-CM

## 2017-04-17 DIAGNOSIS — K029 Dental caries, unspecified: Secondary | ICD-10-CM | POA: Diagnosis not present

## 2017-04-17 DIAGNOSIS — M5416 Radiculopathy, lumbar region: Secondary | ICD-10-CM

## 2017-04-17 DIAGNOSIS — M79605 Pain in left leg: Secondary | ICD-10-CM | POA: Diagnosis not present

## 2017-04-17 MED ORDER — CYCLOBENZAPRINE HCL 5 MG PO TABS: ORAL_TABLET | ORAL | 1 refills | Status: DC

## 2017-04-17 NOTE — Assessment & Plan Note (Signed)
Nortriptyline

## 2017-04-17 NOTE — Assessment & Plan Note (Signed)
Flexeril CBD oil

## 2017-04-17 NOTE — Assessment & Plan Note (Signed)
Flexeril

## 2017-04-17 NOTE — Progress Notes (Signed)
Subjective:  Patient ID: Joseph Preston, male    DOB: 1950/08/16  Age: 67 y.o. MRN: [id number]  CC: No chief complaint on file.   HPI Joseph Preston presents for LBP, anxiety, IBS f/u  Outpatient Medications Prior to Visit  Medication Sig Dispense Refill  . ALPRAZolam (XANAX) 0.5 MG tablet Take 1 tablet (0.5 mg total) by mouth 2 (two) times daily as needed for anxiety. 60 tablet 2  . Cholecalciferol (VITAMIN D) 2000 units CAPS 1 po qd 100 capsule 3  . hyoscyamine (LEVSIN, ANASPAZ) 0.125 MG tablet Take 1 tablet (0.125 mg total) by mouth every 6 (six) hours as needed. 100 tablet 1  . nortriptyline (PAMELOR) 10 MG capsule Take 1-2 capsules (10-20 mg total) by mouth at bedtime. 60 capsule 3  . pantoprazole (PROTONIX) 40 MG tablet Take 1 tablet (40 mg total) by mouth daily. 30 tablet 11  . polyethylene glycol powder (GLYCOLAX/MIRALAX) powder Take 17 g by mouth 2 (two) times daily as needed. 500 g 2  . tamsulosin (FLOMAX) 0.4 MG CAPS capsule Take 1 capsule (0.4 mg total) by mouth daily. 30 capsule 11  . cyclobenzaprine (FLEXERIL) 5 MG tablet TAKE 1 TABLET BY MOUTH TWICE DAILY AS NEEDED FOR MUSCLE SPASM 60 tablet 1   No facility-administered medications prior to visit.     ROS Review of Systems  Constitutional: Negative for appetite change, fatigue and unexpected weight change.  HENT: Negative for congestion, nosebleeds, sneezing, sore throat and trouble swallowing.   Eyes: Negative for itching and visual disturbance.  Respiratory: Negative for cough.   Cardiovascular: Negative for chest pain, palpitations and leg swelling.  Gastrointestinal: Negative for abdominal distention, blood in stool, diarrhea and nausea.  Genitourinary: Negative for frequency and hematuria.  Musculoskeletal: Positive for back pain and gait problem. Negative for joint swelling and neck pain.  Skin: Negative for rash.  Neurological: Negative for dizziness, tremors, speech difficulty and weakness.    Psychiatric/Behavioral: Negative for agitation, dysphoric mood and sleep disturbance. The patient is not nervous/anxious.     Objective:  BP 126/78 (BP Location: Left Arm, Patient Position: Sitting, Cuff Size: Normal)   Pulse 69   Temp 97.9 F (36.6 C) (Oral)   Ht 5\' 7"  (1.702 m)   Wt 161 lb (73 kg)   SpO2 99%   BMI 25.22 kg/m   BP Readings from Last 3 Encounters:  04/17/17 126/78  01/17/17 128/82  10/10/16 130/82    Wt Readings from Last 3 Encounters:  04/17/17 161 lb (73 kg)  01/17/17 160 lb (72.6 kg)  10/10/16 162 lb (73.5 kg)    Physical Exam  Constitutional: He is oriented to person, place, and time. He appears well-developed. No distress.  NAD  HENT:  Mouth/Throat: Oropharynx is clear and moist.  Eyes: Conjunctivae are normal. Pupils are equal, round, and reactive to light.  Neck: Normal range of motion. No JVD present. No thyromegaly present.  Cardiovascular: Normal rate, regular rhythm, normal heart sounds and intact distal pulses. Exam reveals no gallop and no friction rub.  No murmur heard. Pulmonary/Chest: Effort normal and breath sounds normal. No respiratory distress. He has no wheezes. He has no rales. He exhibits no tenderness.  Abdominal: Soft. Bowel sounds are normal. He exhibits no distension and no mass. There is no tenderness. There is no rebound and no guarding.  Musculoskeletal: Normal range of motion. He exhibits tenderness. He exhibits no edema.  Lymphadenopathy:    He has no cervical adenopathy.  Neurological: He is  alert and oriented to person, place, and time. He has normal reflexes. No cranial nerve deficit. He exhibits normal muscle tone. He displays a negative Romberg sign. Coordination and gait normal.  Skin: Skin is warm and dry. No rash noted.  Psychiatric: He has a normal mood and affect. His behavior is normal. Judgment and thought content normal.  LS tender  Lab Results  Component Value Date   WBC 5.8 08/18/2015   HGB 14.3  08/18/2015   HCT 42.8 08/18/2015   PLT 207.0 08/18/2015   GLUCOSE 88 03/10/2016   CHOL 209 (H) 08/18/2015   TRIG 89.0 08/18/2015   HDL 60.60 08/18/2015   LDLCALC 131 (H) 08/18/2015   ALT 20 08/18/2015   AST 22 08/18/2015   NA 142 03/10/2016   K 4.9 03/10/2016   CL 106 03/10/2016   CREATININE 1.02 03/10/2016   BUN 18 03/10/2016   CO2 31 03/10/2016   TSH 1.06 06/02/2013   PSA 9.84 (H) 08/18/2015   HGBA1C 5.7 03/10/2016    Dg Facet Jt Neuro Destruct Sing L/s W/img Guide  Result Date: 09/21/2015 CLINICAL DATA:  Lumbosacral spondylosis without myelopathy. Symptomatic lumbago. EXAM: LEFT L4-5 FACET DENERVATION USING LEFT L3 AND LEFT L4 MEDIAL BRANCH RADIOFREQUENCY ABLATION FLUOROSCOPY TIME:  34 seconds corresponding to a Dose Area Product of 53.14 Gy*m2 TECHNIQUE: Informed written consent was obtained.  Time-out was performed. An appropriate skin entry site was determined fluoroscopically and marked. Operator donned sterile gloves and mask. Site prepped with betadine, draped in usual sterile fashion, and infiltrated locally with 1% lidocaine. A 10 cm, 20 gauge Stryker needle with a 10 mm active tip was placed at the junction of the LEFT L4 superior articular process and LEFT L4 transverse process, directed 1 mm ventrally over the notch, to lie along the course of the LEFT L3 medial branch. Needle tip was confirmed to be in good position fluoroscopically, dorsal to the foramen. Sensory and motor stimulation elicited concordant deep local pain without radicular symptoms or lower extremity fasciculation. After tandem needle placement to allow 1% lidocaine 1 mL around the needle tip, a radiofrequency burn was performed at 80 degrees centigrade for 90 seconds. Postprocedure 40 mg Depo-Medrol was instilled. An identical procedure was performed on the LEFT at the junction LEFT L5 superior articular process and LEFT L5 transverse process, along the course the LEFT L4 medial branch. Sensory and motor  stimulation was carried out, eliciting deep local pain without radicular symptoms. Again a radiofrequency burn was performed at 80 degrees centigrade for 90 seconds, with 40 mg Depo-Medrol postprocedure instilled around the burn site. IMPRESSION: 1. Technically successful LEFT L4-5 facet denervation. The patient was comfortable time of discharge. Electronically Signed   By: Staci Righter M.D.   On: 09/21/2015 10:13   Dg Facet Jt Neuro Destruct Ea Add L/s W/img Guide  Result Date: 09/21/2015 CLINICAL DATA:  Lumbosacral spondylosis without myelopathy. Symptomatic lumbago. EXAM: LEFT L4-5 FACET DENERVATION USING LEFT L3 AND LEFT L4 MEDIAL BRANCH RADIOFREQUENCY ABLATION FLUOROSCOPY TIME:  34 seconds corresponding to a Dose Area Product of 53.14 Gy*m2 TECHNIQUE: Informed written consent was obtained.  Time-out was performed. An appropriate skin entry site was determined fluoroscopically and marked. Operator donned sterile gloves and mask. Site prepped with betadine, draped in usual sterile fashion, and infiltrated locally with 1% lidocaine. A 10 cm, 20 gauge Stryker needle with a 10 mm active tip was placed at the junction of the LEFT L4 superior articular process and LEFT L4 transverse process, directed  1 mm ventrally over the notch, to lie along the course of the LEFT L3 medial branch. Needle tip was confirmed to be in good position fluoroscopically, dorsal to the foramen. Sensory and motor stimulation elicited concordant deep local pain without radicular symptoms or lower extremity fasciculation. After tandem needle placement to allow 1% lidocaine 1 mL around the needle tip, a radiofrequency burn was performed at 80 degrees centigrade for 90 seconds. Postprocedure 40 mg Depo-Medrol was instilled. An identical procedure was performed on the LEFT at the junction LEFT L5 superior articular process and LEFT L5 transverse process, along the course the LEFT L4 medial branch. Sensory and motor stimulation was carried  out, eliciting deep local pain without radicular symptoms. Again a radiofrequency burn was performed at 80 degrees centigrade for 90 seconds, with 40 mg Depo-Medrol postprocedure instilled around the burn site. IMPRESSION: 1. Technically successful LEFT L4-5 facet denervation. The patient was comfortable time of discharge. Electronically Signed   By: Staci Righter M.D.   On: 09/21/2015 10:13    Assessment & Plan:   There are no diagnoses linked to this encounter. I am having Shion L. Rowe Robert maintain his pantoprazole, hyoscyamine, Vitamin D, nortriptyline, tamsulosin, polyethylene glycol powder, ALPRAZolam, and cyclobenzaprine.  Meds ordered this encounter  Medications  . cyclobenzaprine (FLEXERIL) 5 MG tablet    Sig: TAKE 1 TABLET BY MOUTH TWICE DAILY AS NEEDED FOR MUSCLE SPASM    Dispense:  60 tablet    Refill:  1    Please consider 90 day supplies to promote better adherence     Follow-up: No Follow-up on file.  Walker Kehr, MD

## 2017-04-17 NOTE — Assessment & Plan Note (Signed)
2019 - dentures

## 2017-04-17 NOTE — Assessment & Plan Note (Signed)
Per Urology  

## 2017-04-17 NOTE — Telephone Encounter (Signed)
Copied from [place]. Topic: Quick Communication - See Telephone Encounter >> Apr 17, 2017  4:09 PM Boyd Kerbs wrote: CRM for notification. See Telephone encounter for:   Pt. Forgot to ask if can get inhaler for the times he gets winded. If not call patient   If can please call into:  [place], [place] - [place] [address] [place] [address] Phone: [phone] Fax: [phone]    04/17/17.

## 2017-04-18 NOTE — Telephone Encounter (Signed)
Pt called and was told to call back and give a reason on why he is in need of a inhaler, contact pt to advise

## 2017-04-18 NOTE — Telephone Encounter (Signed)
Will route to office for provider review; LOV:04/17/17 with Dr Alain Marion

## 2017-04-19 NOTE — Telephone Encounter (Signed)
Please advise 

## 2017-04-22 MED ORDER — ALBUTEROL SULFATE HFA 108 (90 BASE) MCG/ACT IN AERS: 2.0000 | INHALATION_SPRAY | Freq: Four times a day (QID) | RESPIRATORY_TRACT | 6 refills | Status: DC | PRN

## 2017-04-22 NOTE — Telephone Encounter (Signed)
Okay Pro Air MDI

## 2017-04-30 ENCOUNTER — Ambulatory Visit (INDEPENDENT_AMBULATORY_CARE_PROVIDER_SITE_OTHER)
Admission: RE | Admit: 2017-04-30 | Discharge: 2017-04-30 | Disposition: A | Discharge status: Home / Self Care | Payer: 59 | Source: Ambulatory Visit | Attending: Internal Medicine | Admitting: Internal Medicine

## 2017-04-30 ENCOUNTER — Ambulatory Visit (INDEPENDENT_AMBULATORY_CARE_PROVIDER_SITE_OTHER): Payer: 59 | Admitting: Internal Medicine

## 2017-04-30 ENCOUNTER — Encounter: Payer: Self-pay | Admitting: Internal Medicine

## 2017-04-30 VITALS — BP 118/70 | HR 83 | Temp 99.2°F | Resp 16 | Wt 158.0 lb

## 2017-04-30 DIAGNOSIS — J209 Acute bronchitis, unspecified: Secondary | ICD-10-CM | POA: Insufficient documentation

## 2017-04-30 DIAGNOSIS — J441 Chronic obstructive pulmonary disease with (acute) exacerbation: Secondary | ICD-10-CM

## 2017-04-30 MED ORDER — DOXYCYCLINE HYCLATE 100 MG PO TABS: 100.0000 mg | ORAL_TABLET | Freq: Two times a day (BID) | ORAL | 0 refills | Status: DC

## 2017-04-30 MED ORDER — IPRATROPIUM-ALBUTEROL 0.5-2.5 (3) MG/3ML IN SOLN
3.0000 mL | Freq: Once | RESPIRATORY_TRACT | Status: AC
  Administered 2017-04-30: 3 mL via RESPIRATORY_TRACT

## 2017-04-30 MED ORDER — METHYLPREDNISOLONE ACETATE 80 MG/ML IJ SUSP
80.0000 mg | Freq: Once | INTRAMUSCULAR | Status: AC
  Administered 2017-04-30: 80 mg via INTRAMUSCULAR

## 2017-04-30 MED ORDER — PREDNISONE 10 MG PO TABS: ORAL_TABLET | ORAL | 0 refills | Status: DC

## 2017-04-30 MED ORDER — HYDROCODONE-HOMATROPINE 5-1.5 MG/5ML PO SYRP: 5.0000 mL | ORAL_SOLUTION | Freq: Three times a day (TID) | ORAL | 0 refills | Status: DC | PRN

## 2017-04-30 NOTE — Assessment & Plan Note (Signed)
Related to acute bronchitis, possibly pneumonia Chest x-ray today Depo-Medrol 80 mg IM x1, start prednisone taper tomorrow DuoNeb breathing treatment today Use albuterol inhaler as needed

## 2017-04-30 NOTE — Progress Notes (Signed)
Subjective:    Patient ID: Joseph Preston, male    DOB: 1950/12/04, 67 y.o.   MRN: [id number]  HPI The patient is here for an acute visit.  He is here for an acute visit for cold symptoms.  His symptoms started about two weeks ago and got worse over the past weekend.    He is experiencing fever, chills, fatigue, ear pain, PND, sore throat, chest tightness, cough that is productive of green sputum, SOB, wheeze, chest discomfort, nausea, diarrhea, joint pain, myalgias, headaches and lightheadedness.    He has tried taking allegra- D, theraflu, alka seltzer flu    Medications and allergies reviewed with patient and updated if appropriate.  Patient Active Problem List   Diagnosis Date Noted  . 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 Chronic constipation 10/10/2016  . Ear itching 05/02/2016  . Neoplasm of uncertain behavior of skin 04/14/2016  . Elevated glucose 03/10/2016  . Insomnia 03/10/2016  . Low back pain radiating to left lower extremity 08/09/2015  . Dental caries extending into pulp 10/06/2014  . 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 Acute upper respiratory infection 04/07/2014  . Elevated PSA 08/12/2013  . Well adult exam 06/02/2013  . Mouth sore 06/02/2013  . Irritable bowel syndrome (IBS) 06/02/2013  . Lumbar radiculopathy, chronic 05/09/2013  . Piriformis syndrome of left side 04/17/2013  . PREMATURE EJACULATION 03/11/2007  . ERECTILE DYSFUNCTION 03/11/2007  . Generalized anxiety disorder 09/20/2006  . SMOKER 09/20/2006  . ALLERGIC RHINITIS 09/20/2006  . COPD 09/20/2006  . PROSTATITIS, ACUTE 09/20/2006  . ANKLE PAIN, RIGHT 09/20/2006  . COLONIC POLYPS, HX OF 09/20/2006    Current Outpatient Medications on File Prior to Visit  Medication Sig Dispense Refill  . albuterol (PROAIR HFA) 108 (90 Base) MCG/ACT inhaler Inhale 2 puffs into the lungs every 6 (six) hours as needed for wheezing or shortness of breath. 1 Inhaler 6  . ALPRAZolam (XANAX) 0.5 MG tablet  Take 1 tablet (0.5 mg total) by mouth 2 (two) times daily as needed for anxiety. 60 tablet 2  . Cholecalciferol (VITAMIN D) 2000 units CAPS 1 po qd 100 capsule 3  . cyclobenzaprine (FLEXERIL) 5 MG tablet TAKE 1 TABLET BY MOUTH TWICE DAILY AS NEEDED FOR MUSCLE SPASM 60 tablet 1  . hyoscyamine (LEVSIN, ANASPAZ) 0.125 MG tablet Take 1 tablet (0.125 mg total) by mouth every 6 (six) hours as needed. 100 tablet 1  . nortriptyline (PAMELOR) 10 MG capsule Take 1-2 capsules (10-20 mg total) by mouth at bedtime. 60 capsule 3  . pantoprazole (PROTONIX) 40 MG tablet Take 1 tablet (40 mg total) by mouth daily. 30 tablet 11  . polyethylene glycol powder (GLYCOLAX/MIRALAX) powder Take 17 g by mouth 2 (two) times daily as needed. 500 g 2  . tamsulosin (FLOMAX) 0.4 MG CAPS capsule Take 1 capsule (0.4 mg total) by mouth daily. 30 capsule 11   No current facility-administered medications on file prior to visit.     Past Medical History:  Diagnosis Date  . Anxiety   . Arthritis   . Back pain, chronic     Past Surgical History:  Procedure Laterality Date  . COLONOSCOPY W/ POLYPECTOMY    . LEG SURGERY      Social History   Socioeconomic History  . Marital status: Married    Spouse name: Not on file  . Number of children: Not on file  . Years of education: Not on file  . Highest education level: Not on file  Occupational History  . Not on file  Social Needs  . Financial resource strain: Not on file  . Food insecurity:    Worry: Not on file    Inability: Not on file  . Transportation needs:    Medical: Not on file    Non-medical: Not on file  Tobacco Use  . Smoking status: Former Research scientist (life sciences)  . Smokeless tobacco: Former Network engineer and Sexual Activity  . Alcohol use: No  . Drug use: No  . Sexual activity: Yes  Lifestyle  . Physical activity:    Days per week: Not on file    Minutes per session: Not on file  . Stress: Not on file  Relationships  . Social connections:    Talks on phone:  Not on file    Gets together: Not on file    Attends religious service: Not on file    Active member of club or organization: Not on file    Attends meetings of clubs or organizations: Not on file    Relationship status: Not on file  Other Topics Concern  . Not on file  Social History Narrative  . Not on file    Family History  Problem Relation Age of Onset  . Cancer Mother 81       lung ca  . Cancer Father 53       glioblastoma    Review of Systems  Constitutional: Positive for chills, fatigue and fever.  HENT: Positive for ear pain (intermittent), postnasal drip and sore throat (mild). Negative for congestion, sinus pressure and sinus pain.   Respiratory: Positive for cough (occ productive - green), chest tightness, shortness of breath and wheezing.   Cardiovascular: Positive for chest pain (a little at times).  Gastrointestinal: Positive for diarrhea (a  little) and nausea ( a little).  Musculoskeletal: Positive for arthralgias and myalgias.  Neurological: Positive for dizziness, light-headedness and headaches.       Objective:   Vitals:   04/30/17 1038  BP: 118/70  Pulse: 83  Resp: 16  Temp: 99.2 F (37.3 C)  SpO2: 95%   BP Readings from Last 3 Encounters:  04/30/17 118/70  04/17/17 126/78  01/17/17 128/82   Wt Readings from Last 3 Encounters:  04/30/17 158 lb (71.7 kg)  04/17/17 161 lb (73 kg)  01/17/17 160 lb (72.6 kg)   Body mass index is 24.75 kg/m.   Physical Exam    GENERAL APPEARANCE: Appears stated age, well appearing, NAD EYES: conjunctiva clear, no icterus HEENT: bilateral tympanic membranes and ear canals normal, oropharynx with mild erythema, no thyromegaly, trachea midline, no cervical or supraclavicular lymphadenopathy LUNGS: unlabored breathing, good air entry bilaterally, diffuse wheezing with expiration, no obvious crackles CARDIOVASCULAR: Normal S1,S2 without murmurs, no edema SKIN: Warm, dry      Assessment & Plan:    See  Problem List for Assessment and Plan of chronic medical problems.

## 2017-04-30 NOTE — Assessment & Plan Note (Signed)
Likely bacterial  Associated with diffuse wheezing and chest tightness Chest x-ray to rule out possible pneumonia Start doxy Prescription cough syrup Breathing treatment today Depo-Medrol 80 mg IM x1, start prednisone taper tomorrow otc cold medications Rest, fluid Note given for work Call if no improvement

## 2017-04-30 NOTE — Patient Instructions (Addendum)
Have a chest x-ray today.  We will call you with the results.   You received a steroid injection and a breathing treatment.  Take the antibiotic as prescribed. Start the prednisone taper tomorrow.  A cough medication was sent in.    A note was given for work.   Call if no improvement

## 2017-05-08 ENCOUNTER — Other Ambulatory Visit: Payer: Self-pay | Admitting: Internal Medicine

## 2017-05-22 ENCOUNTER — Ambulatory Visit: Payer: Self-pay | Admitting: *Deleted

## 2017-05-22 NOTE — Telephone Encounter (Signed)
Pt   Seen  2  Weeks   Ago for  Bronchitis  Meds. Breathing  Better   Better  Back  To  Work . Gets  Fatigued.  Still  Has  Some  Congested  . Drinking lots of water has been constipated.Appointment made with Jodi Mourning for tomorrow.Pt advised to drink lots of water and rest .

## 2017-05-23 ENCOUNTER — Other Ambulatory Visit (INDEPENDENT_AMBULATORY_CARE_PROVIDER_SITE_OTHER): Payer: 59

## 2017-05-23 ENCOUNTER — Ambulatory Visit (INDEPENDENT_AMBULATORY_CARE_PROVIDER_SITE_OTHER): Payer: 59 | Admitting: Family

## 2017-05-23 ENCOUNTER — Encounter: Payer: Self-pay | Admitting: Family

## 2017-05-23 VITALS — BP 118/70 | HR 71 | Temp 98.2°F | Ht 67.0 in | Wt 155.1 lb

## 2017-05-23 DIAGNOSIS — R5383 Other fatigue: Secondary | ICD-10-CM | POA: Diagnosis not present

## 2017-05-23 DIAGNOSIS — R972 Elevated prostate specific antigen [PSA]: Secondary | ICD-10-CM

## 2017-05-23 DIAGNOSIS — R252 Cramp and spasm: Secondary | ICD-10-CM

## 2017-05-23 LAB — COMPREHENSIVE METABOLIC PANEL
ALK PHOS: 59 U/L (ref 39–117)
ALT: 22 U/L (ref 0–53)
AST: 22 U/L (ref 0–37)
Albumin: 4 g/dL (ref 3.5–5.2)
BILIRUBIN TOTAL: 1 mg/dL (ref 0.2–1.2)
BUN: 17 mg/dL (ref 6–23)
CO2: 34 mEq/L — ABNORMAL HIGH (ref 19–32)
Calcium: 10.3 mg/dL (ref 8.4–10.5)
Chloride: 105 mEq/L (ref 96–112)
Creatinine, Ser: 1.54 mg/dL — ABNORMAL HIGH (ref 0.40–1.50)
GFR: 48.26 mL/min — AB (ref >60.00)
Glucose, Bld: 122 mg/dL — ABNORMAL HIGH (ref 70–99)
POTASSIUM: 5.1 meq/L (ref 3.5–5.1)
Sodium: 142 mEq/L (ref 135–145)
Total Protein: 6.3 g/dL (ref 6.0–8.3)

## 2017-05-23 LAB — VITAMIN B12: VITAMIN B 12: 968 pg/mL — AB (ref 211–911)

## 2017-05-23 LAB — CBC WITH DIFFERENTIAL/PLATELET
BASOS ABS: 0 10*3/uL (ref 0.0–0.1)
Basophils Relative: 0.2 % (ref 0.0–3.0)
Eosinophils Absolute: 0.1 10*3/uL (ref 0.0–0.7)
Eosinophils Relative: 2.9 % (ref 0.0–5.0)
HEMATOCRIT: 38.5 % — AB (ref 39.0–52.0)
HEMOGLOBIN: 12.9 g/dL — AB (ref 13.0–17.0)
Lymphocytes Relative: 30.5 % (ref 12.0–46.0)
Lymphs Abs: 1.4 10*3/uL (ref 0.7–4.0)
MCHC: 33.6 g/dL (ref 30.0–36.0)
MCV: 87.5 fl (ref 78.0–100.0)
MONOS PCT: 9 % (ref 3.0–12.0)
Monocytes Absolute: 0.4 10*3/uL (ref 0.1–1.0)
Neutro Abs: 2.6 10*3/uL (ref 1.4–7.7)
Neutrophils Relative %: 57.4 % (ref 43.0–77.0)
Platelets: 185 10*3/uL (ref 150.0–400.0)
RBC: 4.4 Mil/uL (ref 4.22–5.81)
RDW: 13.5 % (ref 11.5–15.5)
WBC: 4.6 10*3/uL (ref 4.0–10.5)

## 2017-05-23 LAB — TSH: TSH: 0.6 u[IU]/mL (ref 0.35–4.50)

## 2017-05-23 LAB — MAGNESIUM: Magnesium: 2 mg/dL (ref 1.5–2.5)

## 2017-05-23 LAB — PSA: PSA: 15.8 ng/mL — AB (ref 0.10–4.00)

## 2017-05-23 NOTE — Progress Notes (Signed)
Joseph Preston is a 67 y.o. male with the following history as recorded in [employer]:  Patient Active Problem List   Diagnosis Date Noted  . Acute bronchitis 04/30/2017  . COPD exacerbation ([place]) 04/30/2017  . 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 Chronic constipation 10/10/2016  . Ear itching 05/02/2016  . Neoplasm of uncertain behavior of skin 04/14/2016  . Elevated glucose 03/10/2016  . Insomnia 03/10/2016  . Low back pain radiating to left lower extremity 08/09/2015  . Dental caries extending into pulp 10/06/2014  . 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 Acute upper respiratory infection 04/07/2014  . Elevated PSA 08/12/2013  . Well adult exam 06/02/2013  . Mouth sore 06/02/2013  . Irritable bowel syndrome (IBS) 06/02/2013  . Lumbar radiculopathy, chronic 05/09/2013  . Piriformis syndrome of left side 04/17/2013  . PREMATURE EJACULATION 03/11/2007  . ERECTILE DYSFUNCTION 03/11/2007  . Generalized anxiety disorder 09/20/2006  . SMOKER 09/20/2006  . ALLERGIC RHINITIS 09/20/2006  . COPD 09/20/2006  . PROSTATITIS, ACUTE 09/20/2006  . ANKLE PAIN, RIGHT 09/20/2006  . COLONIC POLYPS, HX OF 09/20/2006    Current Outpatient Medications  Medication Sig Dispense Refill  . albuterol (PROAIR HFA) 108 (90 Base) MCG/ACT inhaler Inhale 2 puffs into the lungs every 6 (six) hours as needed for wheezing or shortness of breath. 1 Inhaler 6  . ALPRAZolam (XANAX) 0.5 MG tablet Take 1 tablet (0.5 mg total) by mouth 2 (two) times daily as needed for anxiety. 60 tablet 2  . Cholecalciferol (VITAMIN D) 2000 units CAPS 1 po qd 100 capsule 3  . cyclobenzaprine (FLEXERIL) 5 MG tablet TAKE 1 TABLET BY MOUTH TWICE DAILY AS NEEDED FOR MUSCLE SPASM 60 tablet 1  . hyoscyamine (LEVSIN, ANASPAZ) 0.125 MG tablet Take 1 tablet (0.125 mg total) by mouth every 6 (six) hours as needed. 100 tablet 1  . nortriptyline (PAMELOR) 10 MG capsule Take 1-2 capsules (10-20 mg total) by mouth at bedtime. 60 capsule 3  .  polyethylene glycol powder (GLYCOLAX/MIRALAX) powder Take 17 g by mouth 2 (two) times daily as needed. 500 g 2  . tamsulosin (FLOMAX) 0.4 MG CAPS capsule Take 1 capsule (0.4 mg total) by mouth daily. 30 capsule 11   No current facility-administered medications for this visit.     Allergies: Penicillins; Sulfonamide derivatives; Varenicline tartrate; and Tramadol  Past Medical History:  Diagnosis Date  . Anxiety   . Arthritis   . Back pain, chronic     Past Surgical History:  Procedure Laterality Date  . COLONOSCOPY W/ POLYPECTOMY    . LEG SURGERY      Family History  Problem Relation Age of Onset  . Cancer Mother 85       lung ca  . Cancer Father 24       glioblastoma    Social History   Tobacco Use  . Smoking status: Former Research scientist (life sciences)  . Smokeless tobacco: Former Network engineer Use Topics  . Alcohol use: No    Subjective:  Patient presents with concerns for worsening fatigue; feels like he just can't enough sleep; wife notes that he "does not sleep at all"- very light sleeper;  may sleep total 3-4 hours in a night x "years." Works swing shifts; does take external vitamin D and B12; no changes in appetite; no bowel movements; denies any chest pain on exertion; does have occasional SOB on exertion- documented COPD; does have history of depression- has taken Prozac in the past; does not want medication at this time;  had normal CXR earlier this month;   Objective:  Vitals:   05/23/17 1442  BP: 118/70  Pulse: 71  Temp: 98.2 F (36.8 C)  TempSrc: Oral  SpO2: 97%  Weight: 155 lb 1.3 oz (70.3 kg)  Height: '5\' 7"'$  (1.702 m)    General: Well developed, well nourished, in no acute distress  Skin : Warm and dry.  Head: Normocephalic and atraumatic  Eyes: Sclera and conjunctiva clear; pupils round and reactive to light; extraocular movements intact  Ears: External normal; canals clear; tympanic membranes normal  Oropharynx: Pink, supple. No suspicious lesions  Neck: Supple without  thyromegaly, adenopathy  Lungs: Respirations unlabored; clear to auscultation bilaterally without wheeze, rales, rhonchi  CVS exam: normal rate and regular rhythm.  Neurologic: Alert and oriented; speech intact; face symmetrical; moves all extremities well; CNII-XII intact without focal deficit  Assessment:  1. Other fatigue   2. Muscle cramps   3. Elevated PSA     Plan:  Suspect fatigue is multi-factorial; suspect underlying sleep disorder- patient is in agreement to sleep specialist if labs unremarkable; discussed contribution of depression- he understands but defers medication; will also give trial of Symbicort to see if COPD affecting his chronic fatigue- ? Not getting enough oxygen; will update labs today; follow up to be determined.   No follow-ups on file.  Orders Placed This Encounter  Procedures  . CBC w/Diff    Standing Status:   Future    Number of Occurrences:   1    Standing Expiration Date:   05/23/2018  . Comp Met (CMET)    Standing Status:   Future    Number of Occurrences:   1    Standing Expiration Date:   05/23/2018  . TSH    Standing Status:   Future    Number of Occurrences:   1    Standing Expiration Date:   05/23/2018  . B12    Standing Status:   Future    Number of Occurrences:   1    Standing Expiration Date:   05/23/2018  . Magnesium    Standing Status:   Future    Number of Occurrences:   1    Standing Expiration Date:   05/23/2018  . PSA    Standing Status:   Future    Number of Occurrences:   1    Standing Expiration Date:   05/23/2018    Requested Prescriptions    No prescriptions requested or ordered in this encounter

## 2017-05-24 ENCOUNTER — Other Ambulatory Visit: Payer: Self-pay | Admitting: Family

## 2017-05-24 DIAGNOSIS — R799 Abnormal finding of blood chemistry, unspecified: Secondary | ICD-10-CM

## 2017-05-24 DIAGNOSIS — D649 Anemia, unspecified: Secondary | ICD-10-CM

## 2017-05-24 DIAGNOSIS — R7989 Other specified abnormal findings of blood chemistry: Secondary | ICD-10-CM

## 2017-05-25 ENCOUNTER — Other Ambulatory Visit: Payer: Self-pay | Admitting: Family

## 2017-05-25 DIAGNOSIS — R972 Elevated prostate specific antigen [PSA]: Secondary | ICD-10-CM

## 2017-05-28 ENCOUNTER — Other Ambulatory Visit (INDEPENDENT_AMBULATORY_CARE_PROVIDER_SITE_OTHER): Payer: 59

## 2017-05-28 DIAGNOSIS — R799 Abnormal finding of blood chemistry, unspecified: Secondary | ICD-10-CM | POA: Diagnosis not present

## 2017-05-28 DIAGNOSIS — R7989 Other specified abnormal findings of blood chemistry: Secondary | ICD-10-CM

## 2017-05-28 DIAGNOSIS — D649 Anemia, unspecified: Secondary | ICD-10-CM

## 2017-05-28 LAB — COMPREHENSIVE METABOLIC PANEL
ALK PHOS: 57 U/L (ref 39–117)
ALT: 18 U/L (ref 0–53)
AST: 20 U/L (ref 0–37)
Albumin: 4.1 g/dL (ref 3.5–5.2)
BILIRUBIN TOTAL: 0.7 mg/dL (ref 0.2–1.2)
BUN: 22 mg/dL (ref 6–23)
CO2: 30 mEq/L (ref 19–32)
CREATININE: 1.03 mg/dL (ref 0.40–1.50)
Calcium: 10.4 mg/dL (ref 8.4–10.5)
Chloride: 105 mEq/L (ref 96–112)
GFR: 76.77 mL/min (ref >60.00)
GLUCOSE: 92 mg/dL (ref 70–99)
Potassium: 4.2 mEq/L (ref 3.5–5.1)
SODIUM: 140 meq/L (ref 135–145)
TOTAL PROTEIN: 6.6 g/dL (ref 6.0–8.3)

## 2017-05-29 ENCOUNTER — Encounter: Payer: Self-pay | Admitting: Family

## 2017-05-29 LAB — IRON,TIBC AND FERRITIN PANEL
%SAT: 21 % (calc) (ref 15–60)
Ferritin: 83 ng/mL (ref 20–380)
Iron: 72 ug/dL (ref 50–180)
TIBC: 336 ug/dL (ref 250–425)

## 2017-05-30 ENCOUNTER — Other Ambulatory Visit: Payer: Self-pay | Admitting: Family

## 2017-05-30 MED ORDER — CIPROFLOXACIN HCL 500 MG PO TABS: 500.0000 mg | ORAL_TABLET | Freq: Two times a day (BID) | ORAL | 0 refills | Status: DC

## 2017-05-30 NOTE — Progress Notes (Signed)
Spoke with patient's wife today and info given. They would like to pick up script today for antibiotic if you can go ahead and fax it in. They know to expect a call from [facility] and that you were going to try and call about getting him in pretty quickly.

## 2017-05-30 NOTE — Telephone Encounter (Signed)
Please advise. Thanks.  

## 2017-06-26 ENCOUNTER — Ambulatory Visit (INDEPENDENT_AMBULATORY_CARE_PROVIDER_SITE_OTHER): Payer: 59 | Admitting: Urology

## 2017-06-26 DIAGNOSIS — N401 Enlarged prostate with lower urinary tract symptoms: Secondary | ICD-10-CM

## 2017-06-26 DIAGNOSIS — R972 Elevated prostate specific antigen [PSA]: Secondary | ICD-10-CM | POA: Diagnosis not present

## 2017-07-18 ENCOUNTER — Ambulatory Visit: Payer: 59 | Admitting: Internal Medicine

## 2017-07-18 DIAGNOSIS — Z0289 Encounter for other administrative examinations: Secondary | ICD-10-CM

## 2017-08-15 ENCOUNTER — Ambulatory Visit: Payer: 59 | Admitting: Internal Medicine

## 2017-08-27 ENCOUNTER — Other Ambulatory Visit: Payer: Self-pay | Admitting: Internal Medicine

## 2017-08-28 ENCOUNTER — Encounter: Payer: Self-pay | Admitting: Internal Medicine

## 2017-08-28 ENCOUNTER — Ambulatory Visit (INDEPENDENT_AMBULATORY_CARE_PROVIDER_SITE_OTHER): Payer: 59 | Admitting: Internal Medicine

## 2017-08-28 DIAGNOSIS — F411 Generalized anxiety disorder: Secondary | ICD-10-CM | POA: Diagnosis not present

## 2017-08-28 DIAGNOSIS — M79605 Pain in left leg: Secondary | ICD-10-CM

## 2017-08-28 DIAGNOSIS — K588 Other irritable bowel syndrome: Secondary | ICD-10-CM | POA: Diagnosis not present

## 2017-08-28 DIAGNOSIS — J441 Chronic obstructive pulmonary disease with (acute) exacerbation: Secondary | ICD-10-CM

## 2017-08-28 DIAGNOSIS — M545 Low back pain: Secondary | ICD-10-CM

## 2017-08-28 DIAGNOSIS — R7309 Other abnormal glucose: Secondary | ICD-10-CM

## 2017-08-28 MED ORDER — ALPRAZOLAM 0.5 MG PO TABS: 0.5000 mg | ORAL_TABLET | Freq: Two times a day (BID) | ORAL | 3 refills | Status: DC | PRN

## 2017-08-28 MED ORDER — FLUTICASONE-UMECLIDIN-VILANT 100-62.5-25 MCG/INH IN AEPB: 1.0000 | INHALATION_SPRAY | Freq: Every day | RESPIRATORY_TRACT | 11 refills | Status: DC

## 2017-08-28 NOTE — Assessment & Plan Note (Signed)
IBS-c Chronic  Levsin prn

## 2017-08-28 NOTE — Assessment & Plan Note (Signed)
Flexeril prn  

## 2017-08-28 NOTE — Assessment & Plan Note (Signed)
Labs

## 2017-08-28 NOTE — Progress Notes (Signed)
Subjective:  Patient ID: Joseph Preston, male    DOB: [birth year]  Age: 67 y.o. MRN: [id number]  CC: No chief complaint on file.   HPI Joseph Preston presents for anxiety, BPH, IBS f/u. C/o wheezing  Outpatient Medications Prior to Visit  Medication Sig Dispense Refill  . albuterol (PROAIR HFA) 108 (90 Base) MCG/ACT inhaler Inhale 2 puffs into the lungs every 6 (six) hours as needed for wheezing or shortness of breath. 1 Inhaler 6  . ALPRAZolam (XANAX) 0.5 MG tablet Take 1 tablet (0.5 mg total) by mouth 2 (two) times daily as needed for anxiety. 60 tablet 2  . Cholecalciferol (VITAMIN D) 2000 units CAPS 1 po qd 100 capsule 3  . cyclobenzaprine (FLEXERIL) 5 MG tablet TAKE 1 TABLET BY MOUTH TWICE DAILY AS NEEDED FOR MUSCLE SPASM 60 tablet 1  . hyoscyamine (LEVSIN, ANASPAZ) 0.125 MG tablet Take 1 tablet (0.125 mg total) by mouth every 6 (six) hours as needed. 100 tablet 1  . nortriptyline (PAMELOR) 10 MG capsule Take 1-2 capsules (10-20 mg total) by mouth at bedtime. 60 capsule 3  . polyethylene glycol powder (GLYCOLAX/MIRALAX) powder Take 17 g by mouth 2 (two) times daily as needed. 500 g 2  . tamsulosin (FLOMAX) 0.4 MG CAPS capsule Take 1 capsule (0.4 mg total) by mouth daily. 30 capsule 11  . ciprofloxacin (CIPRO) 500 MG tablet Take 1 tablet (500 mg total) by mouth 2 (two) times daily. (Patient not taking: Reported on 08/28/2017) 20 tablet 0   No facility-administered medications prior to visit.     ROS: Review of Systems  Constitutional: Negative for appetite change, fatigue and unexpected weight change.  HENT: Negative for congestion, nosebleeds, sneezing, sore throat and trouble swallowing.   Eyes: Negative for itching and visual disturbance.  Respiratory: Negative for cough.   Cardiovascular: Negative for chest pain, palpitations and leg swelling.  Gastrointestinal: Negative for abdominal distention, blood in stool, diarrhea and nausea.  Genitourinary: Negative for frequency  and hematuria.  Musculoskeletal: Negative for back pain, gait problem, joint swelling and neck pain.  Skin: Negative for rash.  Neurological: Negative for dizziness, tremors, speech difficulty and weakness.  Psychiatric/Behavioral: Negative for agitation, dysphoric mood and sleep disturbance. The patient is nervous/anxious.     Objective:  BP 132/78 (BP Location: Left Arm, Patient Position: Sitting, Cuff Size: Normal)   Pulse 65   Temp 98.2 F (36.8 C) (Oral)   Ht 5\' 7"  (1.702 m)   Wt 154 lb (69.9 kg)   SpO2 97%   BMI 24.12 kg/m   BP Readings from Last 3 Encounters:  08/28/17 132/78  05/23/17 118/70  04/30/17 118/70    Wt Readings from Last 3 Encounters:  08/28/17 154 lb (69.9 kg)  05/23/17 155 lb 1.3 oz (70.3 kg)  04/30/17 158 lb (71.7 kg)    Physical Exam  Constitutional: He is oriented to person, place, and time. He appears well-developed. No distress.  NAD  HENT:  Mouth/Throat: Oropharynx is clear and moist.  Eyes: Pupils are equal, round, and reactive to light. Conjunctivae are normal.  Neck: Normal range of motion. No JVD present. No thyromegaly present.  Cardiovascular: Normal rate, regular rhythm, normal heart sounds and intact distal pulses. Exam reveals no gallop and no friction rub.  No murmur heard. Pulmonary/Chest: Effort normal and breath sounds normal. No respiratory distress. He has no wheezes. He has no rales. He exhibits no tenderness.  Abdominal: Soft. Bowel sounds are normal. He exhibits no distension and no  mass. There is no tenderness. There is no rebound and no guarding.  Musculoskeletal: Normal range of motion. He exhibits no edema or tenderness.  Lymphadenopathy:    He has no cervical adenopathy.  Neurological: He is alert and oriented to person, place, and time. He has normal reflexes. No cranial nerve deficit. He exhibits normal muscle tone. He displays a negative Romberg sign. Coordination and gait normal.  Skin: Skin is warm and dry. No rash  noted.  Psychiatric: He has a normal mood and affect. His behavior is normal. Judgment and thought content normal.   I personally provided Trelgy inhaler use teaching. After the teaching patient was able to demonstrate it's use effectively. All questions were answered  Lab Results  Component Value Date   WBC 4.6 05/23/2017   HGB 12.9 (L) 05/23/2017   HCT 38.5 (L) 05/23/2017   PLT 185.0 05/23/2017   GLUCOSE 92 05/28/2017   CHOL 209 (H) 08/18/2015   TRIG 89.0 08/18/2015   HDL 60.60 08/18/2015   LDLCALC 131 (H) 08/18/2015   ALT 18 05/28/2017   AST 20 05/28/2017   NA 140 05/28/2017   K 4.2 05/28/2017   CL 105 05/28/2017   CREATININE 1.03 05/28/2017   BUN 22 05/28/2017   CO2 30 05/28/2017   TSH 0.60 05/23/2017   PSA 15.80 (H) 05/23/2017   HGBA1C 5.7 03/10/2016    Dg Chest 2 View  Result Date: 04/30/2017 CLINICAL DATA:  Cough and congestion with wheezing EXAM: CHEST - 2 VIEW COMPARISON:  None. FINDINGS: Lungs are clear. Heart size and pulmonary vascularity are normal. No adenopathy. No bone lesions. IMPRESSION: No edema or consolidation. Electronically Signed   By: Lowella Grip III M.D.   On: 04/30/2017 11:18    Assessment & Plan:   There are no diagnoses linked to this encounter.   No orders of the defined types were placed in this encounter.    Follow-up: No follow-ups on file.  Walker Kehr, MD

## 2017-08-28 NOTE — Assessment & Plan Note (Signed)
Trelegy qd °

## 2017-08-28 NOTE — Assessment & Plan Note (Signed)
Chronic   Potential benefits of a long term benzodiazepines  use as well as potential risks  and complications were explained to the patient and were aknowledged. Prn Xanax 

## 2017-10-15 ENCOUNTER — Other Ambulatory Visit: Payer: Self-pay | Admitting: Internal Medicine

## 2017-11-11 ENCOUNTER — Other Ambulatory Visit: Payer: Self-pay | Admitting: Internal Medicine

## 2017-12-11 ENCOUNTER — Ambulatory Visit (INDEPENDENT_AMBULATORY_CARE_PROVIDER_SITE_OTHER): Payer: 59 | Admitting: Urology

## 2017-12-11 DIAGNOSIS — N401 Enlarged prostate with lower urinary tract symptoms: Secondary | ICD-10-CM

## 2017-12-11 DIAGNOSIS — R351 Nocturia: Secondary | ICD-10-CM

## 2017-12-11 DIAGNOSIS — R972 Elevated prostate specific antigen [PSA]: Secondary | ICD-10-CM | POA: Diagnosis not present

## 2018-02-19 ENCOUNTER — Ambulatory Visit: Payer: 59 | Admitting: Internal Medicine

## 2018-03-04 ENCOUNTER — Ambulatory Visit: Payer: 59 | Admitting: Internal Medicine

## 2018-04-03 ENCOUNTER — Encounter: Payer: Self-pay | Admitting: Internal Medicine

## 2018-04-03 ENCOUNTER — Other Ambulatory Visit (INDEPENDENT_AMBULATORY_CARE_PROVIDER_SITE_OTHER): Payer: 59

## 2018-04-03 ENCOUNTER — Ambulatory Visit (INDEPENDENT_AMBULATORY_CARE_PROVIDER_SITE_OTHER): Payer: 59 | Admitting: Internal Medicine

## 2018-04-03 ENCOUNTER — Telehealth: Payer: Self-pay | Admitting: Internal Medicine

## 2018-04-03 VITALS — BP 132/84 | HR 63 | Temp 97.5°F | Ht 67.0 in | Wt 155.0 lb

## 2018-04-03 DIAGNOSIS — B779 Ascariasis, unspecified: Secondary | ICD-10-CM

## 2018-04-03 DIAGNOSIS — G4700 Insomnia, unspecified: Secondary | ICD-10-CM

## 2018-04-03 DIAGNOSIS — F411 Generalized anxiety disorder: Secondary | ICD-10-CM

## 2018-04-03 LAB — BASIC METABOLIC PANEL
BUN: 20 mg/dL (ref 6–23)
CALCIUM: 10.9 mg/dL — AB (ref 8.4–10.5)
CHLORIDE: 103 meq/L (ref 96–112)
CO2: 31 meq/L (ref 19–32)
CREATININE: 1.03 mg/dL (ref 0.40–1.50)
GFR: 72.04 mL/min (ref >60.00)
Glucose, Bld: 86 mg/dL (ref 70–99)
Potassium: 4.5 mEq/L (ref 3.5–5.1)
Sodium: 139 mEq/L (ref 135–145)

## 2018-04-03 LAB — HEPATIC FUNCTION PANEL
ALT: 20 U/L (ref 0–53)
AST: 22 U/L (ref 0–37)
Albumin: 4.6 g/dL (ref 3.5–5.2)
Alkaline Phosphatase: 69 U/L (ref 39–117)
BILIRUBIN TOTAL: 0.9 mg/dL (ref 0.2–1.2)
Bilirubin, Direct: 0.2 mg/dL (ref 0.0–0.3)
TOTAL PROTEIN: 7.3 g/dL (ref 6.0–8.3)

## 2018-04-03 LAB — CBC WITH DIFFERENTIAL/PLATELET
BASOS ABS: 0 10*3/uL (ref 0.0–0.1)
Basophils Relative: 0.7 % (ref 0.0–3.0)
Eosinophils Absolute: 0.1 10*3/uL (ref 0.0–0.7)
Eosinophils Relative: 1.6 % (ref 0.0–5.0)
HEMATOCRIT: 42.8 % (ref 39.0–52.0)
Hemoglobin: 14.3 g/dL (ref 13.0–17.0)
LYMPHS ABS: 1.8 10*3/uL (ref 0.7–4.0)
LYMPHS PCT: 32.6 % (ref 12.0–46.0)
MCHC: 33.4 g/dL (ref 30.0–36.0)
MCV: 88.1 fl (ref 78.0–100.0)
Monocytes Absolute: 0.5 10*3/uL (ref 0.1–1.0)
Monocytes Relative: 8.9 % (ref 3.0–12.0)
Neutro Abs: 3.1 10*3/uL (ref 1.4–7.7)
Neutrophils Relative %: 56.2 % (ref 43.0–77.0)
PLATELETS: 249 10*3/uL (ref 150.0–400.0)
RBC: 4.85 Mil/uL (ref 4.22–5.81)
RDW: 13.6 % (ref 11.5–15.5)
WBC: 5.5 10*3/uL (ref 4.0–10.5)

## 2018-04-03 MED ORDER — MEBENDAZOLE 100 MG PO CHEW: CHEWABLE_TABLET | ORAL | 0 refills | Status: DC

## 2018-04-03 MED ORDER — CYCLOBENZAPRINE HCL 5 MG PO TABS: ORAL_TABLET | ORAL | 1 refills | Status: DC

## 2018-04-03 NOTE — Assessment & Plan Note (Signed)
Pt brings a pink 5 inch nematode in the water bottle - alive

## 2018-04-03 NOTE — Progress Notes (Signed)
Subjective:  Patient ID: Joseph Preston, male    DOB: 05-Jul-1950  Age: 68 y.o. MRN: [id number]  CC: No chief complaint on file.   HPI Joseph Preston presents for a worm he pooped out on Sunday F/u LBP, anxiety, IBS C/o abd discomfort x 3 weeks  Outpatient Medications Prior to Visit  Medication Sig Dispense Refill  . albuterol (PROAIR HFA) 108 (90 Base) MCG/ACT inhaler Inhale 2 puffs into the lungs every 6 (six) hours as needed for wheezing or shortness of breath. 1 Inhaler 6  . ALPRAZolam (XANAX) 0.5 MG tablet TAKE 1 TABLET BY MOUTH TWICE DAILY AS NEEDED FOR ANXIETY 60 tablet 3  . ALPRAZolam (XANAX) 0.5 MG tablet Take 1 tablet (0.5 mg total) by mouth 2 (two) times daily as needed for anxiety. 60 tablet 3  . Cholecalciferol (VITAMIN D) 2000 units CAPS 1 po qd 100 capsule 3  . Fluticasone-Umeclidin-Vilant (TRELEGY ELLIPTA) 100-62.5-25 MCG/INH AEPB Inhale 1 spray into the lungs daily. 1 each 11  . hyoscyamine (LEVSIN, ANASPAZ) 0.125 MG tablet TAKE ONE TABLET BY MOUTH EVERY 6 HOURS AS NEEDED 100 tablet 1  . nortriptyline (PAMELOR) 10 MG capsule TAKE 1 TO 2 CAPSULES BY MOUTH ONCE DAILY AT BEDTIME 60 capsule 3  . polyethylene glycol powder (GLYCOLAX/MIRALAX) powder Take 17 g by mouth 2 (two) times daily as needed. 500 g 2  . tamsulosin (FLOMAX) 0.4 MG CAPS capsule Take 1 capsule (0.4 mg total) by mouth daily. 30 capsule 11  . cyclobenzaprine (FLEXERIL) 5 MG tablet TAKE 1 TABLET BY MOUTH TWICE DAILY AS NEEDED FOR MUSCLE SPASM 60 tablet 1   No facility-administered medications prior to visit.     ROS: Review of Systems  Constitutional: Negative for appetite change, fatigue and unexpected weight change.  HENT: Negative for congestion, nosebleeds, sneezing, sore throat and trouble swallowing.   Eyes: Negative for itching and visual disturbance.  Respiratory: Negative for cough.   Cardiovascular: Negative for chest pain, palpitations and leg swelling.  Gastrointestinal: Negative for  abdominal distention, blood in stool, diarrhea and nausea.  Genitourinary: Negative for frequency and hematuria.  Musculoskeletal: Positive for arthralgias and back pain. Negative for gait problem, joint swelling and neck pain.  Skin: Negative for rash.  Neurological: Negative for dizziness, tremors, speech difficulty and weakness.  Psychiatric/Behavioral: Negative for agitation, dysphoric mood, sleep disturbance and suicidal ideas. The patient is not nervous/anxious.     Objective:  BP 132/84 (BP Location: Left Arm, Patient Position: Sitting, Cuff Size: Normal)   Pulse 63   Temp (!) 97.5 F (36.4 C) (Oral)   Ht 5\' 7"  (1.702 m)   Wt 155 lb (70.3 kg)   SpO2 96%   BMI 24.28 kg/m   BP Readings from Last 3 Encounters:  04/03/18 132/84  08/28/17 132/78  05/23/17 118/70    Wt Readings from Last 3 Encounters:  04/03/18 155 lb (70.3 kg)  08/28/17 154 lb (69.9 kg)  05/23/17 155 lb 1.3 oz (70.3 kg)    Physical Exam Constitutional:      General: He is not in acute distress.    Appearance: He is well-developed.     Comments: NAD  Eyes:     Conjunctiva/sclera: Conjunctivae normal.     Pupils: Pupils are equal, round, and reactive to light.  Neck:     Musculoskeletal: Normal range of motion.     Thyroid: No thyromegaly.     Vascular: No JVD.  Cardiovascular:     Rate and Rhythm: Normal rate  and regular rhythm.     Heart sounds: Normal heart sounds. No murmur. No friction rub. No gallop.   Pulmonary:     Effort: Pulmonary effort is normal. No respiratory distress.     Breath sounds: Normal breath sounds. No wheezing or rales.  Chest:     Chest wall: No tenderness.  Abdominal:     General: Bowel sounds are normal. There is no distension.     Palpations: Abdomen is soft. There is no mass.     Tenderness: There is no abdominal tenderness. There is no guarding or rebound.  Musculoskeletal: Normal range of motion.        General: No tenderness.  Lymphadenopathy:     Cervical: No  cervical adenopathy.  Skin:    General: Skin is warm and dry.     Findings: No rash.  Neurological:     Mental Status: He is alert and oriented to person, place, and time.     Cranial Nerves: No cranial nerve deficit.     Motor: No abnormal muscle tone.     Coordination: Coordination normal.     Gait: Gait normal.     Deep Tendon Reflexes: Reflexes are normal and symmetric.  Psychiatric:        Behavior: Behavior normal.        Thought Content: Thought content normal.        Judgment: Judgment normal.   Pt brings a pink 5 inch nematode in the water bottle - alive  Lab Results  Component Value Date   WBC 4.6 05/23/2017   HGB 12.9 (L) 05/23/2017   HCT 38.5 (L) 05/23/2017   PLT 185.0 05/23/2017   GLUCOSE 92 05/28/2017   CHOL 209 (H) 08/18/2015   TRIG 89.0 08/18/2015   HDL 60.60 08/18/2015   LDLCALC 131 (H) 08/18/2015   ALT 18 05/28/2017   AST 20 05/28/2017   NA 140 05/28/2017   K 4.2 05/28/2017   CL 105 05/28/2017   CREATININE 1.03 05/28/2017   BUN 22 05/28/2017   CO2 30 05/28/2017   TSH 0.60 05/23/2017   PSA 15.80 (H) 05/23/2017   HGBA1C 5.7 03/10/2016    Dg Chest 2 View  Result Date: 04/30/2017 CLINICAL DATA:  Cough and congestion with wheezing EXAM: CHEST - 2 VIEW COMPARISON:  None. FINDINGS: Lungs are clear. Heart size and pulmonary vascularity are normal. No adenopathy. No bone lesions. IMPRESSION: No edema or consolidation. Electronically Signed   By: Lowella Grip III M.D.   On: 04/30/2017 11:18    Assessment & Plan:   There are no diagnoses linked to this encounter.   Meds ordered this encounter  Medications  . cyclobenzaprine (FLEXERIL) 5 MG tablet    Sig: TAKE 1 TABLET BY MOUTH TWICE DAILY AS NEEDED FOR MUSCLE SPASM    Dispense:  60 tablet    Refill:  1    Please consider 90 day supplies to promote better adherence     Follow-up: No follow-ups on file.  Walker Kehr, MD

## 2018-04-03 NOTE — Patient Instructions (Signed)
An estimated 807 million-[id number] billion people in the world are infected with Ascaris lumbricoides (sometimes called just Ascaris or ascariasis). Ascaris, hookworm, and whipworm are parasitic worms known as soil-transmitted helminths (STH). Together, they account for a major burden of parasitic disease worldwide. Ascariasis is now uncommon in the [place].  Ascaris parasites live in the intestine and Ascaris eggs are passed in the feces (poop) of infected people. If an infected person defecates outside (for example, near bushes, in a garden, or in a field), or if the feces of an infected person are used as fertilizer, eggs are deposited on soil. The eggs can then mature into a form of the parasite that is infective. Ascariasis is caused by ingesting eggs. This can happen when hands or fingers that have contaminated dirt on them are put in the mouth, or by consuming vegetables or fruits that have not been carefully cooked, washed, or peeled. People infected with Ascaris often show no symptoms. If symptoms do occur they can be light and include abdominal discomfort. Heavy infections can cause intestinal blockage and impair growth in children. Other symptoms such as cough are due to migration of the worms through the body. Ascariasis is treatable with medication prescribed by your health care provider. Humans can also be infected by pig roundworm (Ascaris suum). Ascaris lumbricoides (human roundworm) and Ascaris suum (pig roundworm) are indistinguishable. It is unknown how many people worldwide are infected with Ascaris suum. Image: Left/Right: Fertilized eggs of A. lumbricoides in unstained wet mounts of stool. 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 Credit: [employer], [facility], [place], [place].

## 2018-04-03 NOTE — Telephone Encounter (Signed)
Copied from [place] [id number]. Topic: Quick Communication - Rx Refill/Question >> Apr 03, 2018  2:24 PM Margot Ables wrote: Medication: mebendazole (VERMOX) 100 MG chewable tablet  - insurance is needing PA for medication - pt notes no other drug available for him and needs approval  Has the patient contacted their pharmacy? yes Preferred Pharmacy (with phone number or street name): [place], [place] - [address] [phone] (Phone) [phone] (Fax)

## 2018-04-03 NOTE — Assessment & Plan Note (Signed)
Weighted blanket 

## 2018-04-03 NOTE — Telephone Encounter (Signed)
PA started [place]: [employer]

## 2018-04-05 NOTE — Telephone Encounter (Addendum)
Insurance states that this has been approved.  [facility] is telling him that they are still waiting on Prior Approval.  Called [facility] and spoke to [place]: Insurance will only pay for 1 which cost $13+. Anything other would over $100 a day.  They have 4 in stock to try to get him through the weekend and have ordered the remaining.  NDC not covered due to age of patient. [facility] has tried to rerun it but it is still not recognizing the approval.  Can you help with this?

## 2018-04-08 NOTE — Telephone Encounter (Signed)
Pa redone for 1/2 since patient has picked up 1/2 RX

## 2018-04-23 ENCOUNTER — Telehealth: Payer: Self-pay | Admitting: Internal Medicine

## 2018-04-23 MED ORDER — NORTRIPTYLINE HCL 10 MG PO CAPS: ORAL_CAPSULE | ORAL | 3 refills | Status: DC

## 2018-04-23 NOTE — Telephone Encounter (Signed)
Patient called requesting refill on nortriptyline to [facility] in [place].

## 2018-04-23 NOTE — Telephone Encounter (Signed)
RX sent

## 2018-07-04 ENCOUNTER — Encounter: Payer: Self-pay | Admitting: Internal Medicine

## 2018-07-04 ENCOUNTER — Ambulatory Visit (INDEPENDENT_AMBULATORY_CARE_PROVIDER_SITE_OTHER): Payer: 59 | Admitting: Internal Medicine

## 2018-07-04 DIAGNOSIS — J449 Chronic obstructive pulmonary disease, unspecified: Secondary | ICD-10-CM

## 2018-07-04 DIAGNOSIS — R05 Cough: Secondary | ICD-10-CM

## 2018-07-04 DIAGNOSIS — R059 Cough, unspecified: Secondary | ICD-10-CM

## 2018-07-04 DIAGNOSIS — F411 Generalized anxiety disorder: Secondary | ICD-10-CM | POA: Diagnosis not present

## 2018-07-04 MED ORDER — FLUTICASONE-UMECLIDIN-VILANT 100-62.5-25 MCG/INH IN AEPB: 1.0000 | INHALATION_SPRAY | Freq: Every day | RESPIRATORY_TRACT | 11 refills | Status: DC

## 2018-07-04 MED ORDER — ALBUTEROL SULFATE HFA 108 (90 BASE) MCG/ACT IN AERS: 2.0000 | INHALATION_SPRAY | Freq: Four times a day (QID) | RESPIRATORY_TRACT | 6 refills | Status: DC | PRN

## 2018-07-04 NOTE — Assessment & Plan Note (Signed)
Xanax as needed. °

## 2018-07-04 NOTE — Assessment & Plan Note (Signed)
Trelegy daily Albuterol MDI as needed

## 2018-07-04 NOTE — Progress Notes (Signed)
Virtual Visit via Video Note  I connected with Joseph Preston on 07/04/18 at  9:10 AM EDT by a video enabled telemedicine application and verified that I am speaking with the correct person using two identifiers.   I discussed the limitations of evaluation and management by telemedicine and the availability of in person appointments. The patient expressed understanding and agreed to proceed.  History of Present Illness: We need to follow-up on anxiety, COPD  There has been no runny nose, chest pain, shortness of breath, abdominal pain, diarrhea, constipation, arthralgias, skin rashes.   Observations/Objective: The patient appears to be in no acute distress, looks well.  Assessment and Plan:  See my Assessment and Plan. Follow Up Instructions:    I discussed the assessment and treatment plan with the patient. The patient was provided an opportunity to ask questions and all were answered. The patient agreed with the plan and demonstrated an understanding of the instructions.   The patient was advised to call back or seek an in-person evaluation if the symptoms worsen or if the condition fails to improve as anticipated.  I provided face-to-face time during this encounter. We were at different locations.   Walker Kehr, MD

## 2018-07-31 ENCOUNTER — Other Ambulatory Visit: Payer: Self-pay

## 2018-07-31 ENCOUNTER — Other Ambulatory Visit: Payer: 59

## 2018-07-31 DIAGNOSIS — Z20822 Contact with and (suspected) exposure to covid-19: Secondary | ICD-10-CM

## 2018-08-07 LAB — NOVEL CORONAVIRUS, NAA: SARS-CoV-2, NAA: NOT DETECTED

## 2018-09-17 ENCOUNTER — Telehealth: Payer: Self-pay | Admitting: Internal Medicine

## 2018-09-17 DIAGNOSIS — Z0000 Encounter for general adult medical examination without abnormal findings: Secondary | ICD-10-CM

## 2018-09-17 NOTE — Telephone Encounter (Addendum)
°  Relation to pt: self  Call back number: [phone] [place], [place] - [address] [phone] (Phone) [phone] (Fax)     Reason for call:   Patient requesting  viagra sildenafil informed please allow 48 to 72 hour turn around time.  Patient will drop off physical form around noon time on 09/18/2018, informed patient turn around time 5 to 7 business day.   Patient requesting full panel lab orders, patient would like lab draw when he drop forms off, please advise

## 2018-09-18 ENCOUNTER — Telehealth: Payer: Self-pay | Admitting: Internal Medicine

## 2018-09-18 MED ORDER — SILDENAFIL CITRATE 100 MG PO TABS
100.0000 mg | ORAL_TABLET | Freq: Every day | ORAL | 5 refills | Status: DC | PRN
Start: 2018-09-18 — End: 2019-12-02

## 2018-09-18 NOTE — Telephone Encounter (Signed)
Pt would like an rx sent in for Sildinifil.  Pharmacy: [facility] in [place]  Please advise.

## 2018-09-19 NOTE — Telephone Encounter (Signed)
Rx signed and faxed to pharmacy

## 2018-09-25 ENCOUNTER — Ambulatory Visit (INDEPENDENT_AMBULATORY_CARE_PROVIDER_SITE_OTHER): Payer: 59 | Admitting: Internal Medicine

## 2018-09-25 ENCOUNTER — Encounter: Payer: Self-pay | Admitting: Internal Medicine

## 2018-09-25 ENCOUNTER — Other Ambulatory Visit (INDEPENDENT_AMBULATORY_CARE_PROVIDER_SITE_OTHER): Payer: 59

## 2018-09-25 ENCOUNTER — Other Ambulatory Visit: Payer: Self-pay

## 2018-09-25 VITALS — BP 140/90 | HR 67 | Temp 98.2°F | Ht 67.0 in | Wt 150.0 lb

## 2018-09-25 DIAGNOSIS — N529 Male erectile dysfunction, unspecified: Secondary | ICD-10-CM

## 2018-09-25 DIAGNOSIS — R972 Elevated prostate specific antigen [PSA]: Secondary | ICD-10-CM

## 2018-09-25 DIAGNOSIS — R7309 Other abnormal glucose: Secondary | ICD-10-CM

## 2018-09-25 DIAGNOSIS — Z0000 Encounter for general adult medical examination without abnormal findings: Secondary | ICD-10-CM

## 2018-09-25 LAB — URINALYSIS
Bilirubin Urine: NEGATIVE
Hgb urine dipstick: NEGATIVE
Ketones, ur: NEGATIVE
Leukocytes,Ua: NEGATIVE
Nitrite: NEGATIVE
Specific Gravity, Urine: 1.02 (ref 1.000–1.030)
Total Protein, Urine: NEGATIVE
Urine Glucose: NEGATIVE
Urobilinogen, UA: 0.2 (ref 0.0–1.0)
pH: 7.5 (ref 5.0–8.0)

## 2018-09-25 LAB — CBC WITH DIFFERENTIAL/PLATELET
Basophils Absolute: 0 10*3/uL (ref 0.0–0.1)
Basophils Relative: 0.4 % (ref 0.0–3.0)
Eosinophils Absolute: 0.1 10*3/uL (ref 0.0–0.7)
Eosinophils Relative: 2.2 % (ref 0.0–5.0)
HCT: 41.6 % (ref 39.0–52.0)
Hemoglobin: 13.7 g/dL (ref 13.0–17.0)
Lymphocytes Relative: 34.1 % (ref 12.0–46.0)
Lymphs Abs: 2 10*3/uL (ref 0.7–4.0)
MCHC: 32.9 g/dL (ref 30.0–36.0)
MCV: 90.4 fl (ref 78.0–100.0)
Monocytes Absolute: 0.4 10*3/uL (ref 0.1–1.0)
Monocytes Relative: 7.3 % (ref 3.0–12.0)
Neutro Abs: 3.3 10*3/uL (ref 1.4–7.7)
Neutrophils Relative %: 56 % (ref 43.0–77.0)
Platelets: 217 10*3/uL (ref 150.0–400.0)
RBC: 4.6 Mil/uL (ref 4.22–5.81)
RDW: 13.8 % (ref 11.5–15.5)
WBC: 6 10*3/uL (ref 4.0–10.5)

## 2018-09-25 LAB — BASIC METABOLIC PANEL
BUN: 15 mg/dL (ref 6–23)
CO2: 30 mEq/L (ref 19–32)
Calcium: 10.5 mg/dL (ref 8.4–10.5)
Chloride: 102 mEq/L (ref 96–112)
Creatinine, Ser: 1.19 mg/dL (ref 0.40–1.50)
GFR: 60.9 mL/min (ref >60.00)
Glucose, Bld: 104 mg/dL — ABNORMAL HIGH (ref 70–99)
Potassium: 4.8 mEq/L (ref 3.5–5.1)
Sodium: 140 mEq/L (ref 135–145)

## 2018-09-25 LAB — LIPID PANEL
Cholesterol: 213 mg/dL — ABNORMAL HIGH (ref 0–200)
HDL: 82.4 mg/dL (ref >39.00)
LDL Cholesterol: 104 mg/dL — ABNORMAL HIGH (ref 0–99)
NonHDL: 130.75
Total CHOL/HDL Ratio: 3
Triglycerides: 135 mg/dL (ref 0.0–149.0)
VLDL: 27 mg/dL (ref 0.0–40.0)

## 2018-09-25 LAB — HEPATIC FUNCTION PANEL
ALT: 16 U/L (ref 0–53)
AST: 20 U/L (ref 0–37)
Albumin: 4.7 g/dL (ref 3.5–5.2)
Alkaline Phosphatase: 69 U/L (ref 39–117)
Bilirubin, Direct: 0.2 mg/dL (ref 0.0–0.3)
Total Bilirubin: 1.1 mg/dL (ref 0.2–1.2)
Total Protein: 6.9 g/dL (ref 6.0–8.3)

## 2018-09-25 LAB — PSA: PSA: 14.05 ng/mL — ABNORMAL HIGH (ref 0.10–4.00)

## 2018-09-25 LAB — TSH: TSH: 1.37 u[IU]/mL (ref 0.35–4.50)

## 2018-09-25 NOTE — Assessment & Plan Note (Signed)
Viagra prn 

## 2018-09-25 NOTE — Progress Notes (Signed)
Subjective:  Patient ID: Joseph Preston, male    DOB: 04-26-1950  Age: 68 y.o. MRN: [id number]  CC: No chief complaint on file.   HPI Joseph Preston presents for a well exam  Outpatient Medications Prior to Visit  Medication Sig Dispense Refill  . albuterol (PROAIR HFA) 108 (90 Base) MCG/ACT inhaler Inhale 2 puffs into the lungs every 6 (six) hours as needed for wheezing or shortness of breath. 1 Inhaler 6  . Cholecalciferol (VITAMIN D) 2000 units CAPS 1 po qd 100 capsule 3  . cyclobenzaprine (FLEXERIL) 5 MG tablet TAKE 1 TABLET BY MOUTH TWICE DAILY AS NEEDED FOR MUSCLE SPASM 60 tablet 1  . Fluticasone-Umeclidin-Vilant (TRELEGY ELLIPTA) 100-62.5-25 MCG/INH AEPB Inhale 1 spray into the lungs daily. 1 each 11  . hyoscyamine (LEVSIN, ANASPAZ) 0.125 MG tablet TAKE ONE TABLET BY MOUTH EVERY 6 HOURS AS NEEDED 100 tablet 1  . mebendazole (VERMOX) 100 MG chewable tablet 100 mg po bid x 3 days. Repeat in 3 weeks. 12 tablet 0  . nortriptyline (PAMELOR) 10 MG capsule TAKE 1 TO 2 CAPSULES BY MOUTH ONCE DAILY AT BEDTIME 60 capsule 3  . polyethylene glycol powder (GLYCOLAX/MIRALAX) powder Take 17 g by mouth 2 (two) times daily as needed. 500 g 2  . sildenafil (VIAGRA) 100 MG tablet Take 1 tablet (100 mg total) by mouth daily as needed for erectile dysfunction. 12 tablet 5  . tamsulosin (FLOMAX) 0.4 MG CAPS capsule Take 1 capsule (0.4 mg total) by mouth daily. 30 capsule 11   No facility-administered medications prior to visit.     ROS: Review of Systems  Constitutional: Negative for appetite change, fatigue and unexpected weight change.  HENT: Negative for congestion, nosebleeds, sneezing, sore throat and trouble swallowing.   Eyes: Negative for itching and visual disturbance.  Respiratory: Negative for cough.   Cardiovascular: Negative for chest pain, palpitations and leg swelling.  Gastrointestinal: Negative for abdominal distention, blood in stool, diarrhea and nausea.  Genitourinary:  Positive for urgency. Negative for frequency and hematuria.  Musculoskeletal: Negative for back pain, gait problem, joint swelling and neck pain.  Skin: Negative for rash.  Neurological: Negative for dizziness, tremors, speech difficulty and weakness.  Psychiatric/Behavioral: Negative for agitation, dysphoric mood, sleep disturbance and suicidal ideas. The patient is not nervous/anxious.     Objective:  BP 140/90 (BP Location: Left Arm, Patient Position: Sitting, Cuff Size: Normal)   Pulse 67   Temp 98.2 F (36.8 C) (Oral)   Ht 5\' 7"  (1.702 m)   Wt 150 lb (68 kg)   SpO2 98%   BMI 23.49 kg/m   BP Readings from Last 3 Encounters:  09/25/18 140/90  04/03/18 132/84  08/28/17 132/78    Wt Readings from Last 3 Encounters:  09/25/18 150 lb (68 kg)  04/03/18 155 lb (70.3 kg)  08/28/17 154 lb (69.9 kg)    Physical Exam Constitutional:      General: He is not in acute distress.    Appearance: He is well-developed.     Comments: NAD  Eyes:     Conjunctiva/sclera: Conjunctivae normal.     Pupils: Pupils are equal, round, and reactive to light.  Neck:     Musculoskeletal: Normal range of motion.     Thyroid: No thyromegaly.     Vascular: No JVD.  Cardiovascular:     Rate and Rhythm: Normal rate and regular rhythm.     Heart sounds: Normal heart sounds. No murmur. No friction rub. No gallop.  Pulmonary:     Effort: Pulmonary effort is normal. No respiratory distress.     Breath sounds: Normal breath sounds. No wheezing or rales.  Chest:     Chest wall: No tenderness.  Abdominal:     General: Bowel sounds are normal. There is no distension.     Palpations: Abdomen is soft. There is no mass.     Tenderness: There is no abdominal tenderness. There is no guarding or rebound.  Musculoskeletal: Normal range of motion.        General: No tenderness.  Lymphadenopathy:     Cervical: No cervical adenopathy.  Skin:    General: Skin is warm and dry.     Findings: No rash.   Neurological:     Mental Status: He is alert and oriented to person, place, and time.     Cranial Nerves: No cranial nerve deficit.     Motor: No abnormal muscle tone.     Coordination: Coordination normal.     Gait: Gait normal.     Deep Tendon Reflexes: Reflexes are normal and symmetric.  Psychiatric:        Behavior: Behavior normal.        Thought Content: Thought content normal.        Judgment: Judgment normal.    pt refused rectal exam edentulous  Lab Results  Component Value Date   WBC 5.5 04/03/2018   HGB 14.3 04/03/2018   HCT 42.8 04/03/2018   PLT 249.0 04/03/2018   GLUCOSE 86 04/03/2018   CHOL 209 (H) 08/18/2015   TRIG 89.0 08/18/2015   HDL 60.60 08/18/2015   LDLCALC 131 (H) 08/18/2015   ALT 20 04/03/2018   AST 22 04/03/2018   NA 139 04/03/2018   K 4.5 04/03/2018   CL 103 04/03/2018   CREATININE 1.03 04/03/2018   BUN 20 04/03/2018   CO2 31 04/03/2018   TSH 0.60 05/23/2017   PSA 15.80 (H) 05/23/2017   HGBA1C 5.7 03/10/2016    Dg Chest 2 View  Result Date: 04/30/2017 CLINICAL DATA:  Cough and congestion with wheezing EXAM: CHEST - 2 VIEW COMPARISON:  None. FINDINGS: Lungs are clear. Heart size and pulmonary vascularity are normal. No adenopathy. No bone lesions. IMPRESSION: No edema or consolidation. Electronically Signed   By: Lowella Grip III M.D.   On: 04/30/2017 11:18    Assessment & Plan:   There are no diagnoses linked to this encounter.   No orders of the defined types were placed in this encounter.    Follow-up: No follow-ups on file.  Walker Kehr, MD

## 2018-09-25 NOTE — Assessment & Plan Note (Signed)
We discussed age appropriate health related issues, including available/recomended screening tests and vaccinations. We discussed a need for adhering to healthy diet and exercise. Labs were ordered to be later reviewed . All questions were answered.  Pt declined shots, rectal exam

## 2018-09-25 NOTE — Assessment & Plan Note (Signed)
Labs

## 2018-11-19 ENCOUNTER — Other Ambulatory Visit: Payer: Self-pay | Admitting: Internal Medicine

## 2018-11-20 NOTE — Telephone Encounter (Signed)
Last filled in 2018, please advise

## 2019-01-20 ENCOUNTER — Other Ambulatory Visit: Payer: Self-pay | Admitting: Internal Medicine

## 2019-03-18 ENCOUNTER — Other Ambulatory Visit: Payer: Self-pay | Admitting: Internal Medicine

## 2019-03-21 ENCOUNTER — Encounter: Payer: Self-pay | Admitting: Internal Medicine

## 2019-03-21 ENCOUNTER — Other Ambulatory Visit: Payer: Self-pay

## 2019-03-21 ENCOUNTER — Ambulatory Visit (INDEPENDENT_AMBULATORY_CARE_PROVIDER_SITE_OTHER): Payer: Self-pay | Admitting: Internal Medicine

## 2019-03-21 DIAGNOSIS — F411 Generalized anxiety disorder: Secondary | ICD-10-CM

## 2019-03-21 DIAGNOSIS — N320 Bladder-neck obstruction: Secondary | ICD-10-CM

## 2019-03-21 DIAGNOSIS — G4700 Insomnia, unspecified: Secondary | ICD-10-CM

## 2019-03-21 DIAGNOSIS — J449 Chronic obstructive pulmonary disease, unspecified: Secondary | ICD-10-CM

## 2019-03-21 MED ORDER — ALBUTEROL SULFATE HFA 108 (90 BASE) MCG/ACT IN AERS: 2.0000 | INHALATION_SPRAY | RESPIRATORY_TRACT | 3 refills | Status: DC | PRN

## 2019-03-21 MED ORDER — TAMSULOSIN HCL 0.4 MG PO CAPS: 0.4000 mg | ORAL_CAPSULE | Freq: Every day | ORAL | 3 refills | Status: DC

## 2019-03-21 MED ORDER — TRELEGY ELLIPTA 100-62.5-25 MCG/INH IN AEPB: 1.0000 | INHALATION_SPRAY | Freq: Every day | RESPIRATORY_TRACT | 11 refills | Status: DC

## 2019-03-21 MED ORDER — NORTRIPTYLINE HCL 10 MG PO CAPS: ORAL_CAPSULE | ORAL | 3 refills | Status: DC

## 2019-03-21 MED ORDER — ALPRAZOLAM 0.5 MG PO TABS: 0.5000 mg | ORAL_TABLET | Freq: Two times a day (BID) | ORAL | 1 refills | Status: DC | PRN

## 2019-03-21 NOTE — Progress Notes (Signed)
Virtual Visit via Video Note  I connected with Joseph Preston on 03/21/19 at 11:00 AM EST by a video enabled telemedicine application and verified that I am speaking with the correct person using two identifiers.   I discussed the limitations of evaluation and management by telemedicine and the availability of in person appointments. The patient expressed understanding and agreed to proceed.  History of Present Illness: We need to follow-up on BPH, anxiety, COPD  There has been no runny nose, chest pain,abdominal pain, diarrhea, constipation, arthralgias, skin rashes.   Observations/Objective: The patient appears to be in no acute distress, looks well.  Assessment and Plan:  See my Assessment and Plan. Follow Up Instructions:    I discussed the assessment and treatment plan with the patient. The patient was provided an opportunity to ask questions and all were answered. The patient agreed with the plan and demonstrated an understanding of the instructions.   The patient was advised to call back or seek an in-person evaluation if the symptoms worsen or if the condition fails to improve as anticipated.  I provided face-to-face time during this encounter. We were at different locations.   Walker Kehr, MD

## 2019-03-21 NOTE — Assessment & Plan Note (Signed)
Trelegy daily Albuterol inhaler as needed

## 2019-03-21 NOTE — Assessment & Plan Note (Signed)
Pamelor 2 tablets at night

## 2019-03-21 NOTE — Assessment & Plan Note (Signed)
Xanax as needed  Potential benefits of a long term benzodiazepines  use as well as potential risks  and complications were explained to the patient and were aknowledged. 

## 2019-03-21 NOTE — Assessment & Plan Note (Signed)
Flomax daily

## 2019-03-27 ENCOUNTER — Ambulatory Visit: Payer: 59 | Admitting: Internal Medicine

## 2019-07-07 ENCOUNTER — Ambulatory Visit (INDEPENDENT_AMBULATORY_CARE_PROVIDER_SITE_OTHER): Payer: Medicare Other | Admitting: Internal Medicine

## 2019-07-07 ENCOUNTER — Encounter: Payer: Self-pay | Admitting: Internal Medicine

## 2019-07-07 ENCOUNTER — Other Ambulatory Visit: Payer: Self-pay

## 2019-07-07 VITALS — BP 140/80 | HR 77 | Temp 98.4°F | Ht 67.0 in | Wt 142.0 lb

## 2019-07-07 DIAGNOSIS — F411 Generalized anxiety disorder: Secondary | ICD-10-CM | POA: Diagnosis not present

## 2019-07-07 DIAGNOSIS — F17200 Nicotine dependence, unspecified, uncomplicated: Secondary | ICD-10-CM

## 2019-07-07 DIAGNOSIS — R7309 Other abnormal glucose: Secondary | ICD-10-CM

## 2019-07-07 DIAGNOSIS — J449 Chronic obstructive pulmonary disease, unspecified: Secondary | ICD-10-CM | POA: Diagnosis not present

## 2019-07-07 DIAGNOSIS — Z0000 Encounter for general adult medical examination without abnormal findings: Secondary | ICD-10-CM

## 2019-07-07 DIAGNOSIS — E785 Hyperlipidemia, unspecified: Secondary | ICD-10-CM

## 2019-07-07 MED ORDER — ALPRAZOLAM 0.5 MG PO TABS: 0.5000 mg | ORAL_TABLET | Freq: Two times a day (BID) | ORAL | 1 refills | Status: DC | PRN

## 2019-07-07 MED ORDER — NORTRIPTYLINE HCL 10 MG PO CAPS: ORAL_CAPSULE | ORAL | 3 refills | Status: DC

## 2019-07-07 NOTE — Assessment & Plan Note (Signed)
Xanax prn 

## 2019-07-07 NOTE — Progress Notes (Signed)
Subjective:  Patient ID: Joseph Preston, male    DOB: February 04, 1950  Age: 69 y.o. MRN: [id number]  CC: No chief complaint on file.   HPI Joseph Preston presents for anxiety, BPH, allergies f/u  Pt refused COVID and other vaccinations  Outpatient Medications Prior to Visit  Medication Sig Dispense Refill  . albuterol (PROAIR HFA) 108 (90 Base) MCG/ACT inhaler Inhale 2 puffs into the lungs every 4 (four) hours as needed for wheezing or shortness of breath. 54 g 3  . Cholecalciferol (VITAMIN D) 2000 units CAPS 1 po qd 100 capsule 3  . cyclobenzaprine (FLEXERIL) 5 MG tablet TAKE 1 TABLET BY MOUTH TWICE DAILY AS NEEDED FOR MUSCLE SPASM 60 tablet 1  . Fluticasone-Umeclidin-Vilant (TRELEGY ELLIPTA) 100-62.5-25 MCG/INH AEPB Inhale 1 spray into the lungs daily. 3 each 11  . polyethylene glycol powder (GLYCOLAX/MIRALAX) powder Take 17 g by mouth 2 (two) times daily as needed. 500 g 2  . sildenafil (VIAGRA) 100 MG tablet Take 1 tablet (100 mg total) by mouth daily as needed for erectile dysfunction. 12 tablet 5  . tamsulosin (FLOMAX) 0.4 MG CAPS capsule Take 1 capsule (0.4 mg total) by mouth daily. 90 capsule 3  . ALPRAZolam (XANAX) 0.5 MG tablet Take 1 tablet (0.5 mg total) by mouth 2 (two) times daily as needed. for anxiety 180 tablet 1  . nortriptyline (PAMELOR) 10 MG capsule TAKE 1 TO 2 CAPSULES BY MOUTH ONCE DAILY AT BEDTIME 180 capsule 3  . hyoscyamine (LEVSIN, ANASPAZ) 0.125 MG tablet TAKE ONE TABLET BY MOUTH EVERY 6 HOURS AS NEEDED (Patient not taking: Reported on 07/07/2019) 100 tablet 1   No facility-administered medications prior to visit.    ROS: Review of Systems  Constitutional: Negative for appetite change, fatigue and unexpected weight change.  HENT: Negative for congestion, nosebleeds, sneezing, sore throat and trouble swallowing.   Eyes: Negative for itching and visual disturbance.  Respiratory: Negative for cough.   Cardiovascular: Negative for chest pain, palpitations and  leg swelling.  Gastrointestinal: Negative for abdominal distention, blood in stool, diarrhea and nausea.  Genitourinary: Negative for frequency and hematuria.  Musculoskeletal: Negative for back pain, gait problem, joint swelling and neck pain.  Skin: Negative for rash.  Neurological: Negative for dizziness, tremors, speech difficulty and weakness.  Psychiatric/Behavioral: Negative for agitation, dysphoric mood and sleep disturbance. The patient is nervous/anxious.     Objective:  BP 140/80 (BP Location: Left Arm, Patient Position: Sitting, Cuff Size: Normal)   Pulse 77   Temp 98.4 F (36.9 C) (Oral)   Ht 5\' 7"  (1.702 m)   Wt 142 lb (64.4 kg)   SpO2 96%   BMI 22.24 kg/m   BP Readings from Last 3 Encounters:  07/07/19 140/80  09/25/18 140/90  04/03/18 132/84    Wt Readings from Last 3 Encounters:  07/07/19 142 lb (64.4 kg)  09/25/18 150 lb (68 kg)  04/03/18 155 lb (70.3 kg)    Physical Exam Constitutional:      General: He is not in acute distress.    Appearance: He is well-developed.     Comments: NAD  Eyes:     Conjunctiva/sclera: Conjunctivae normal.     Pupils: Pupils are equal, round, and reactive to light.  Neck:     Thyroid: No thyromegaly.     Vascular: No JVD.  Cardiovascular:     Rate and Rhythm: Normal rate and regular rhythm.     Heart sounds: Normal heart sounds. No murmur. No friction rub.  No gallop.   Pulmonary:     Effort: Pulmonary effort is normal. No respiratory distress.     Breath sounds: Normal breath sounds. No wheezing or rales.  Chest:     Chest wall: No tenderness.  Abdominal:     General: Bowel sounds are normal. There is no distension.     Palpations: Abdomen is soft. There is no mass.     Tenderness: There is no abdominal tenderness. There is no guarding or rebound.  Musculoskeletal:        General: No tenderness. Normal range of motion.     Cervical back: Normal range of motion.  Lymphadenopathy:     Cervical: No cervical  adenopathy.  Skin:    General: Skin is warm and dry.     Findings: No rash.  Neurological:     Mental Status: He is alert and oriented to person, place, and time.     Cranial Nerves: No cranial nerve deficit.     Motor: No abnormal muscle tone.     Coordination: Coordination normal.     Gait: Gait normal.     Deep Tendon Reflexes: Reflexes are normal and symmetric.  Psychiatric:        Behavior: Behavior normal.        Thought Content: Thought content normal.        Judgment: Judgment normal.     Lab Results  Component Value Date   WBC 6.0 09/25/2018   HGB 13.7 09/25/2018   HCT 41.6 09/25/2018   PLT 217.0 09/25/2018   GLUCOSE 104 (H) 09/25/2018   CHOL 213 (H) 09/25/2018   TRIG 135.0 09/25/2018   HDL 82.40 09/25/2018   LDLCALC 104 (H) 09/25/2018   ALT 16 09/25/2018   AST 20 09/25/2018   NA 140 09/25/2018   K 4.8 09/25/2018   CL 102 09/25/2018   CREATININE 1.19 09/25/2018   BUN 15 09/25/2018   CO2 30 09/25/2018   TSH 1.37 09/25/2018   PSA 14.05 (H) 09/25/2018   HGBA1C 5.7 03/10/2016    DG Chest 2 View  Result Date: 04/30/2017 CLINICAL DATA:  Cough and congestion with wheezing EXAM: CHEST - 2 VIEW COMPARISON:  None. FINDINGS: Lungs are clear. Heart size and pulmonary vascularity are normal. No adenopathy. No bone lesions. IMPRESSION: No edema or consolidation. Electronically Signed   By: Lowella Grip III M.D.   On: 04/30/2017 11:18    Assessment & Plan:   There are no diagnoses linked to this encounter.   Meds ordered this encounter  Medications  . ALPRAZolam (XANAX) 0.5 MG tablet    Sig: Take 1 tablet (0.5 mg total) by mouth 2 (two) times daily as needed. for anxiety    Dispense:  180 tablet    Refill:  1    3 months supply  . nortriptyline (PAMELOR) 10 MG capsule    Sig: TAKE 1 TO 2 CAPSULES BY MOUTH ONCE DAILY AT BEDTIME    Dispense:  180 capsule    Refill:  3    Please consider 90 day supplies to promote better adherence     Follow-up: No  follow-ups on file.  Walker Kehr, MD

## 2019-07-07 NOTE — Patient Instructions (Signed)

## 2019-07-07 NOTE — Assessment & Plan Note (Signed)
Pt refused COVID and other vaccinations 1 ppd smoker discussed

## 2019-07-07 NOTE — Assessment & Plan Note (Signed)
Labs

## 2019-07-07 NOTE — Assessment & Plan Note (Addendum)
1 ppd - discussed A  cardiac CT scan for calcium scoring offered

## 2019-07-09 ENCOUNTER — Ambulatory Visit (INDEPENDENT_AMBULATORY_CARE_PROVIDER_SITE_OTHER): Payer: Medicare Other | Admitting: Internal Medicine

## 2019-07-09 ENCOUNTER — Encounter: Payer: Self-pay | Admitting: Internal Medicine

## 2019-07-09 ENCOUNTER — Other Ambulatory Visit: Payer: Self-pay

## 2019-07-09 DIAGNOSIS — G4700 Insomnia, unspecified: Secondary | ICD-10-CM

## 2019-07-09 DIAGNOSIS — L509 Urticaria, unspecified: Secondary | ICD-10-CM

## 2019-07-09 MED ORDER — FEXOFENADINE-PSEUDOEPHED ER 180-240 MG PO TB24: 1.0000 | ORAL_TABLET | Freq: Every day | ORAL | 1 refills | Status: AC | PRN

## 2019-07-09 MED ORDER — METHYLPREDNISOLONE ACETATE 80 MG/ML IJ SUSP
80.0000 mg | Freq: Once | INTRAMUSCULAR | Status: AC
  Administered 2019-07-09: 80 mg via INTRAMUSCULAR

## 2019-07-09 MED ORDER — METHYLPREDNISOLONE 4 MG PO TBPK: ORAL_TABLET | ORAL | 0 refills | Status: DC

## 2019-07-09 MED ORDER — HYDROXYZINE HCL 25 MG PO TABS: 25.0000 mg | ORAL_TABLET | Freq: Three times a day (TID) | ORAL | 0 refills | Status: DC | PRN

## 2019-07-09 NOTE — Patient Instructions (Signed)
Hives Hives are itchy, red, swollen areas on your skin. Hives can show up on any part of your body. Hives often fade within 24 hours (acute hives). New hives can show up after old ones fade. This can go on for many days or weeks (chronic hives). Hives do not spread from person to person (are not contagious). Hives are caused by your body's response to something that you are allergic to (allergen). These are sometimes called triggers. You can get hives right after being around a trigger, or hours later. What are the causes?  Allergies to foods.  Insect bites or stings.  Pollen.  Pets.  Latex.  Chemicals.  Spending time in sunlight, heat, or cold.  Exercise.  Stress.  Some medicines.  Viruses. This includes the common cold.  Infections caused by germs (bacteria).  Allergy shots.  Blood transfusions. Sometimes, the cause is not known. What increases the risk?  Being a woman.  Being allergic to foods such as: ? Citrus fruits. ? Milk. ? Eggs. ? Peanuts. ? Tree nuts. ? Shellfish.  Being allergic to: ? Medicines. ? Latex. ? Insects. ? Animals. ? Pollen. What are the signs or symptoms?   Raised, itchy, red or white bumps or patches on your skin. These areas may: ? Get large and swollen. ? Change in shape and location. ? Stand alone or connect to each other over a large area of skin. ? Sting or hurt. ? Turn white when pressed in the center (blanch). In very bad cases, your hands, feet, and face may also get swollen. This may happen if hives start deeper in your skin. How is this treated? Treatment for this condition depends on your symptoms. Treatment may include:  Using cool, wet cloths (cool compresses) or taking cool showers to stop the itching.  Medicines that help: ? Relieve itching (antihistamines). ? Reduce swelling (corticosteroids). ? Treat infection (antibiotics).  A medicine (omalizumab) that is given as a shot (injection). Your doctor may  prescribe this if you have hives that do not get better even after other treatments.  In very bad cases, you may need a shot of a medicine called epinephrineto prevent a life-threatening allergic reaction (anaphylaxis). Follow these instructions at home: Medicines  Take or apply over-the-counter and prescription medicines only as told by your doctor.  If you were prescribed an antibiotic medicine, use it as told by your doctor. Do not stop using it even if you start to feel better. Skin care  Apply cool, wet cloths to the hives.  Do not scratch your skin. Do not rub your skin. General instructions  Do not take hot showers or baths. This can make itching worse.  Do not wear tight clothes.  Use sunscreen and wear clothes that cover your skin when you are outside.  Avoid any triggers that cause your hives. Keep a journal to help track what causes your hives. Write down: ? What medicines you take. ? What you eat and drink. ? What products you use on your skin.  Keep all follow-up visits as told by your doctor. This is important. Contact a doctor if:  Your symptoms are not better with medicine.  Your joints hurt or are swollen. Get help right away if:  You have a fever.  You have pain in your belly (abdomen).  Your tongue or lips are swollen.  Your eyelids are swollen.  Your chest or throat feels tight.  You have trouble breathing or swallowing. These symptoms may be an emergency.  Do not wait to see if the symptoms will go away. Get medical help right away. Call your local emergency services (911 in the [place] Do not drive yourself to the hospital. Summary  Hives are itchy, red, swollen areas on your skin.  Treatment for this condition depends on your symptoms.  Avoid things that cause your hives. Keep a journal to help track what causes your hives.  Take and apply over-the-counter and prescription medicines only as told by your doctor.  Keep all follow-up visits  as told by your doctor. This is important. This information is not intended to replace advice given to you by your health care provider. Make sure you discuss any questions you have with your health care provider. Document Revised: 08/01/2017 Document Reviewed: 08/01/2017 Elsevier Patient Education  2020 [employer]  

## 2019-07-09 NOTE — Progress Notes (Signed)
Subjective:  Patient ID: Joseph Preston, male    DOB: 1950-12-05  Age: 69 y.o. MRN: [id number]  CC: No chief complaint on file.   HPI Joseph Preston presents for a rash since Sunday, none on Monday, re-occurred on Mon night - now worse; did not sleep last night  Outpatient Medications Prior to Visit  Medication Sig Dispense Refill  . albuterol (PROAIR HFA) 108 (90 Base) MCG/ACT inhaler Inhale 2 puffs into the lungs every 4 (four) hours as needed for wheezing or shortness of breath. 54 g 3  . ALPRAZolam (XANAX) 0.5 MG tablet Take 1 tablet (0.5 mg total) by mouth 2 (two) times daily as needed. for anxiety 180 tablet 1  . Cholecalciferol (VITAMIN D) 2000 units CAPS 1 po qd 100 capsule 3  . cyclobenzaprine (FLEXERIL) 5 MG tablet TAKE 1 TABLET BY MOUTH TWICE DAILY AS NEEDED FOR MUSCLE SPASM 60 tablet 1  . Fluticasone-Umeclidin-Vilant (TRELEGY ELLIPTA) 100-62.5-25 MCG/INH AEPB Inhale 1 spray into the lungs daily. 3 each 11  . hyoscyamine (LEVSIN, ANASPAZ) 0.125 MG tablet TAKE ONE TABLET BY MOUTH EVERY 6 HOURS AS NEEDED (Patient not taking: Reported on 07/07/2019) 100 tablet 1  . nortriptyline (PAMELOR) 10 MG capsule TAKE 1 TO 2 CAPSULES BY MOUTH ONCE DAILY AT BEDTIME 180 capsule 3  . polyethylene glycol powder (GLYCOLAX/MIRALAX) powder Take 17 g by mouth 2 (two) times daily as needed. 500 g 2  . sildenafil (VIAGRA) 100 MG tablet Take 1 tablet (100 mg total) by mouth daily as needed for erectile dysfunction. 12 tablet 5  . tamsulosin (FLOMAX) 0.4 MG CAPS capsule Take 1 capsule (0.4 mg total) by mouth daily. 90 capsule 3   No facility-administered medications prior to visit.    ROS: Review of Systems  Constitutional: Negative for appetite change, fatigue and unexpected weight change.  HENT: Negative for congestion, nosebleeds, sneezing, sore throat and trouble swallowing.   Eyes: Negative for itching and visual disturbance.  Respiratory: Negative for cough.   Cardiovascular: Negative  for chest pain, palpitations and leg swelling.  Gastrointestinal: Negative for abdominal distention, blood in stool, diarrhea and nausea.  Genitourinary: Negative for frequency and hematuria.  Musculoskeletal: Negative for back pain, gait problem, joint swelling and neck pain.  Skin: Positive for rash.  Neurological: Negative for dizziness, tremors, speech difficulty and weakness.  Psychiatric/Behavioral: Negative for agitation, dysphoric mood, sleep disturbance and suicidal ideas. The patient is not nervous/anxious.     Objective:  BP (!) 142/78 (BP Location: Left Arm, Patient Position: Sitting, Cuff Size: Normal)   Pulse 85   Temp 99 F (37.2 C) (Oral)   Ht 5\' 7"  (1.702 m)   Wt 144 lb 6 oz (65.5 kg)   SpO2 97%   BMI 22.61 kg/m   BP Readings from Last 3 Encounters:  07/09/19 (!) 142/78  07/07/19 140/80  09/25/18 140/90    Wt Readings from Last 3 Encounters:  07/09/19 144 lb 6 oz (65.5 kg)  07/07/19 142 lb (64.4 kg)  09/25/18 150 lb (68 kg)    Physical Exam Constitutional:      General: He is not in acute distress.    Appearance: He is well-developed.     Comments: NAD  Eyes:     Conjunctiva/sclera: Conjunctivae normal.     Pupils: Pupils are equal, round, and reactive to light.  Neck:     Thyroid: No thyromegaly.     Vascular: No JVD.  Cardiovascular:     Rate and Rhythm: Normal rate  and regular rhythm.     Heart sounds: Normal heart sounds. No murmur. No friction rub. No gallop.   Pulmonary:     Effort: Pulmonary effort is normal. No respiratory distress.     Breath sounds: Normal breath sounds. No wheezing or rales.  Chest:     Chest wall: No tenderness.  Abdominal:     General: Bowel sounds are normal. There is no distension.     Palpations: Abdomen is soft. There is no mass.     Tenderness: There is no abdominal tenderness. There is no guarding or rebound.  Musculoskeletal:        General: No tenderness. Normal range of motion.     Cervical back: Normal  range of motion.  Lymphadenopathy:     Cervical: No cervical adenopathy.  Skin:    General: Skin is warm and dry.     Findings: No rash.  Neurological:     Mental Status: He is alert and oriented to person, place, and time.     Cranial Nerves: No cranial nerve deficit.     Motor: No abnormal muscle tone.     Coordination: Coordination normal.     Gait: Gait normal.     Deep Tendon Reflexes: Reflexes are normal and symmetric.  Psychiatric:        Behavior: Behavior normal.        Thought Content: Thought content normal.        Judgment: Judgment normal.   Hives all over  Lab Results  Component Value Date   WBC 6.0 09/25/2018   HGB 13.7 09/25/2018   HCT 41.6 09/25/2018   PLT 217.0 09/25/2018   GLUCOSE 104 (H) 09/25/2018   CHOL 213 (H) 09/25/2018   TRIG 135.0 09/25/2018   HDL 82.40 09/25/2018   LDLCALC 104 (H) 09/25/2018   ALT 16 09/25/2018   AST 20 09/25/2018   NA 140 09/25/2018   K 4.8 09/25/2018   CL 102 09/25/2018   CREATININE 1.19 09/25/2018   BUN 15 09/25/2018   CO2 30 09/25/2018   TSH 1.37 09/25/2018   PSA 14.05 (H) 09/25/2018   HGBA1C 5.7 03/10/2016    DG Chest 2 View  Result Date: 04/30/2017 CLINICAL DATA:  Cough and congestion with wheezing EXAM: CHEST - 2 VIEW COMPARISON:  None. FINDINGS: Lungs are clear. Heart size and pulmonary vascularity are normal. No adenopathy. No bone lesions. IMPRESSION: No edema or consolidation. Electronically Signed   By: Lowella Grip III M.D.   On: 04/30/2017 11:18    Assessment & Plan:    Walker Kehr, MD

## 2019-07-09 NOTE — Assessment & Plan Note (Signed)
Hydroxyzine should help

## 2019-07-09 NOTE — Assessment & Plan Note (Addendum)
Acute - new, severe ?etiology  Allegra D Depo-Medrol IM Hydroxyzine Medrol pac  Potential benefits of a short term steroid  use as well as potential risks  and complications were explained to the patient and were aknowledged.

## 2019-07-29 ENCOUNTER — Encounter: Payer: Self-pay | Admitting: Internal Medicine

## 2019-08-26 DIAGNOSIS — H0102A Squamous blepharitis right eye, upper and lower eyelids: Secondary | ICD-10-CM | POA: Diagnosis not present

## 2019-08-26 DIAGNOSIS — H524 Presbyopia: Secondary | ICD-10-CM | POA: Diagnosis not present

## 2019-08-26 DIAGNOSIS — H25013 Cortical age-related cataract, bilateral: Secondary | ICD-10-CM | POA: Diagnosis not present

## 2019-08-26 DIAGNOSIS — H2513 Age-related nuclear cataract, bilateral: Secondary | ICD-10-CM | POA: Diagnosis not present

## 2019-08-26 DIAGNOSIS — H04123 Dry eye syndrome of bilateral lacrimal glands: Secondary | ICD-10-CM | POA: Diagnosis not present

## 2019-11-21 ENCOUNTER — Ambulatory Visit: Payer: Medicare Other | Admitting: Internal Medicine

## 2019-11-26 ENCOUNTER — Telehealth: Payer: Self-pay | Admitting: Internal Medicine

## 2019-11-26 NOTE — Telephone Encounter (Signed)
Called pt to schedule AWV-I with [facility], but was unable to LVM because [facility] was not set up yet.

## 2019-12-02 ENCOUNTER — Other Ambulatory Visit: Payer: Self-pay

## 2019-12-02 ENCOUNTER — Ambulatory Visit (INDEPENDENT_AMBULATORY_CARE_PROVIDER_SITE_OTHER): Payer: Medicare Other | Admitting: Internal Medicine

## 2019-12-02 ENCOUNTER — Encounter: Payer: Self-pay | Admitting: Internal Medicine

## 2019-12-02 DIAGNOSIS — R7309 Other abnormal glucose: Secondary | ICD-10-CM

## 2019-12-02 DIAGNOSIS — J441 Chronic obstructive pulmonary disease with (acute) exacerbation: Secondary | ICD-10-CM

## 2019-12-02 DIAGNOSIS — J449 Chronic obstructive pulmonary disease, unspecified: Secondary | ICD-10-CM | POA: Diagnosis not present

## 2019-12-02 DIAGNOSIS — Z0000 Encounter for general adult medical examination without abnormal findings: Secondary | ICD-10-CM

## 2019-12-02 DIAGNOSIS — J069 Acute upper respiratory infection, unspecified: Secondary | ICD-10-CM | POA: Diagnosis not present

## 2019-12-02 DIAGNOSIS — F411 Generalized anxiety disorder: Secondary | ICD-10-CM

## 2019-12-02 MED ORDER — ALPRAZOLAM 0.5 MG PO TABS: 0.5000 mg | ORAL_TABLET | Freq: Two times a day (BID) | ORAL | 1 refills | Status: DC | PRN

## 2019-12-02 MED ORDER — CYCLOBENZAPRINE HCL 5 MG PO TABS
ORAL_TABLET | ORAL | 0 refills | Status: DC
Start: 2019-12-02 — End: 2021-03-21

## 2019-12-02 MED ORDER — SILDENAFIL CITRATE 100 MG PO TABS: 100.0000 mg | ORAL_TABLET | Freq: Every day | ORAL | 0 refills | Status: DC | PRN

## 2019-12-02 NOTE — Assessment & Plan Note (Signed)
Trelegy qd °

## 2019-12-02 NOTE — Assessment & Plan Note (Signed)
Potential benefits of a long term benzodiazepines  use as well as potential risks  and complications were explained to the patient and were aknowledged. Prn Xanax

## 2019-12-02 NOTE — Assessment & Plan Note (Deleted)
Trelegy qd °

## 2019-12-02 NOTE — Assessment & Plan Note (Signed)
Allegra  

## 2019-12-02 NOTE — Progress Notes (Signed)
Subjective:  Patient ID: Joseph Preston, male    DOB: 01-Aug-1950  Age: 69 y.o. MRN: [id number]  CC: Follow-up (3 MONTH F/U) and Medication Refill (Flexeril, Sildenafil, Hycosamine, and Alprazolam)   HPI Joseph Preston presents for COPD, anxiety, LBP Pt refused COVID 19 vaccination  Outpatient Medications Prior to Visit  Medication Sig Dispense Refill  . albuterol (PROAIR HFA) 108 (90 Base) MCG/ACT inhaler Inhale 2 puffs into the lungs every 4 (four) hours as needed for wheezing or shortness of breath. 54 g 3  . Cholecalciferol (VITAMIN D) 2000 units CAPS 1 po qd 100 capsule 3  . fexofenadine-pseudoephedrine (ALLEGRA-D ALLERGY & CONGESTION) 180-240 MG 24 hr tablet Take 1 tablet by mouth daily as needed. 90 tablet 1  . Fluticasone-Umeclidin-Vilant (TRELEGY ELLIPTA) 100-62.5-25 MCG/INH AEPB Inhale 1 spray into the lungs daily. 3 each 11  . hydrOXYzine (ATARAX/VISTARIL) 25 MG tablet Take 1 tablet (25 mg total) by mouth every 8 (eight) hours as needed for itching. 60 tablet 0  . hyoscyamine (LEVSIN, ANASPAZ) 0.125 MG tablet TAKE ONE TABLET BY MOUTH EVERY 6 HOURS AS NEEDED 100 tablet 1  . nortriptyline (PAMELOR) 10 MG capsule TAKE 1 TO 2 CAPSULES BY MOUTH ONCE DAILY AT BEDTIME 180 capsule 3  . polyethylene glycol powder (GLYCOLAX/MIRALAX) powder Take 17 g by mouth 2 (two) times daily as needed. 500 g 2  . tamsulosin (FLOMAX) 0.4 MG CAPS capsule Take 1 capsule (0.4 mg total) by mouth daily. 90 capsule 3  . ALPRAZolam (XANAX) 0.5 MG tablet Take 1 tablet (0.5 mg total) by mouth 2 (two) times daily as needed. for anxiety 180 tablet 1  . cyclobenzaprine (FLEXERIL) 5 MG tablet TAKE 1 TABLET BY MOUTH TWICE DAILY AS NEEDED FOR MUSCLE SPASM 60 tablet 1  . sildenafil (VIAGRA) 100 MG tablet Take 1 tablet (100 mg total) by mouth daily as needed for erectile dysfunction. 12 tablet 5  . methylPREDNISolone (MEDROL DOSEPAK) 4 MG TBPK tablet As directed (Patient not taking: Reported on 12/02/2019) 21 tablet  0   No facility-administered medications prior to visit.    ROS: Review of Systems  Constitutional: Negative for appetite change, fatigue and unexpected weight change.  HENT: Negative for congestion, nosebleeds, sneezing, sore throat and trouble swallowing.   Eyes: Negative for itching and visual disturbance.  Respiratory: Negative for cough.   Cardiovascular: Negative for chest pain, palpitations and leg swelling.  Gastrointestinal: Negative for abdominal distention, blood in stool, diarrhea and nausea.  Genitourinary: Negative for frequency and hematuria.  Musculoskeletal: Negative for back pain, gait problem, joint swelling and neck pain.  Skin: Negative for rash.  Neurological: Negative for dizziness, tremors, speech difficulty and weakness.  Psychiatric/Behavioral: Negative for agitation, dysphoric mood, sleep disturbance and suicidal ideas. The patient is not nervous/anxious.     Objective:  BP (!) 142/78 (BP Location: Left Arm)   Pulse 66   Temp 98.5 F (36.9 C) (Oral)   Wt 149 lb 6.4 oz (67.8 kg)   SpO2 98%   BMI 23.40 kg/m   BP Readings from Last 3 Encounters:  12/02/19 (!) 142/78  07/09/19 (!) 142/78  07/07/19 140/80    Wt Readings from Last 3 Encounters:  12/02/19 149 lb 6.4 oz (67.8 kg)  07/09/19 144 lb 6 oz (65.5 kg)  07/07/19 142 lb (64.4 kg)    Physical Exam Constitutional:      General: He is not in acute distress.    Appearance: He is well-developed.     Comments: NAD  Eyes:     Conjunctiva/sclera: Conjunctivae normal.     Pupils: Pupils are equal, round, and reactive to light.  Neck:     Thyroid: No thyromegaly.     Vascular: No JVD.  Cardiovascular:     Rate and Rhythm: Normal rate and regular rhythm.     Heart sounds: Normal heart sounds. No murmur heard.  No friction rub. No gallop.   Pulmonary:     Effort: Pulmonary effort is normal. No respiratory distress.     Breath sounds: Normal breath sounds. No wheezing or rales.  Chest:      Chest wall: No tenderness.  Abdominal:     General: Bowel sounds are normal. There is no distension.     Palpations: Abdomen is soft. There is no mass.     Tenderness: There is no abdominal tenderness. There is no guarding or rebound.  Musculoskeletal:        General: No tenderness. Normal range of motion.     Cervical back: Normal range of motion.  Lymphadenopathy:     Cervical: No cervical adenopathy.  Skin:    General: Skin is warm and dry.     Findings: No rash.  Neurological:     Mental Status: He is alert and oriented to person, place, and time.     Cranial Nerves: No cranial nerve deficit.     Motor: No abnormal muscle tone.     Coordination: Coordination normal.     Gait: Gait normal.     Deep Tendon Reflexes: Reflexes are normal and symmetric.  Psychiatric:        Behavior: Behavior normal.        Thought Content: Thought content normal.        Judgment: Judgment normal.     Lab Results  Component Value Date   WBC 6.0 09/25/2018   HGB 13.7 09/25/2018   HCT 41.6 09/25/2018   PLT 217.0 09/25/2018   GLUCOSE 104 (H) 09/25/2018   CHOL 213 (H) 09/25/2018   TRIG 135.0 09/25/2018   HDL 82.40 09/25/2018   LDLCALC 104 (H) 09/25/2018   ALT 16 09/25/2018   AST 20 09/25/2018   NA 140 09/25/2018   K 4.8 09/25/2018   CL 102 09/25/2018   CREATININE 1.19 09/25/2018   BUN 15 09/25/2018   CO2 30 09/25/2018   TSH 1.37 09/25/2018   PSA 14.05 (H) 09/25/2018   HGBA1C 5.7 03/10/2016    DG Chest 2 View  Result Date: 04/30/2017 CLINICAL DATA:  Cough and congestion with wheezing EXAM: CHEST - 2 VIEW COMPARISON:  None. FINDINGS: Lungs are clear. Heart size and pulmonary vascularity are normal. No adenopathy. No bone lesions. IMPRESSION: No edema or consolidation. Electronically Signed   By: Lowella Grip III M.D.   On: 04/30/2017 11:18    Assessment & Plan:   Maleki was seen today for follow-up and medication refill.  Diagnoses and all orders for this visit:  Well  adult exam -     sildenafil (VIAGRA) 100 MG tablet; Take 1 tablet (100 mg total) by mouth daily as needed for erectile dysfunction.  COPD exacerbation (HCC)  Elevated glucose  Chronic obstructive pulmonary disease, unspecified COPD type ([place])  Acute upper respiratory infection  Other orders -     ALPRAZolam (XANAX) 0.5 MG tablet; Take 1 tablet (0.5 mg total) by mouth 2 (two) times daily as needed. for anxiety -     cyclobenzaprine (FLEXERIL) 5 MG tablet; TAKE 1 TABLET BY MOUTH TWICE DAILY  AS NEEDED FOR MUSCLE SPASM     Meds ordered this encounter  Medications  . ALPRAZolam (XANAX) 0.5 MG tablet    Sig: Take 1 tablet (0.5 mg total) by mouth 2 (two) times daily as needed. for anxiety    Dispense:  180 tablet    Refill:  1    3 months supply  . cyclobenzaprine (FLEXERIL) 5 MG tablet    Sig: TAKE 1 TABLET BY MOUTH TWICE DAILY AS NEEDED FOR MUSCLE SPASM    Dispense:  180 tablet    Refill:  0    Please consider 90 day supplies to promote better adherence  . sildenafil (VIAGRA) 100 MG tablet    Sig: Take 1 tablet (100 mg total) by mouth daily as needed for erectile dysfunction.    Dispense:  90 tablet    Refill:  0     Follow-up: Return in about 6 months (around 05/31/2020).  Walker Kehr, MD

## 2019-12-02 NOTE — Assessment & Plan Note (Signed)
Labs

## 2019-12-02 NOTE — Patient Instructions (Signed)
You can try [place] Mushroom extract or capsules for memory   

## 2020-01-31 ENCOUNTER — Encounter (HOSPITAL_COMMUNITY): Payer: Self-pay | Admitting: *Deleted

## 2020-01-31 ENCOUNTER — Emergency Department (HOSPITAL_COMMUNITY)
Admission: EM | Admit: 2020-01-31 | Discharge: 2020-01-31 | Disposition: A | Discharge status: Home / Self Care | Payer: Medicare Other | Attending: Emergency Medicine | Admitting: Emergency Medicine

## 2020-01-31 ENCOUNTER — Other Ambulatory Visit: Payer: Self-pay

## 2020-01-31 DIAGNOSIS — X58XXXA Exposure to other specified factors, initial encounter: Secondary | ICD-10-CM | POA: Diagnosis not present

## 2020-01-31 DIAGNOSIS — Y9301 Activity, walking, marching and hiking: Secondary | ICD-10-CM | POA: Insufficient documentation

## 2020-01-31 DIAGNOSIS — Y92481 Parking lot as the place of occurrence of the external cause: Secondary | ICD-10-CM | POA: Insufficient documentation

## 2020-01-31 DIAGNOSIS — Z87891 Personal history of nicotine dependence: Secondary | ICD-10-CM | POA: Diagnosis not present

## 2020-01-31 DIAGNOSIS — T1591XA Foreign body on external eye, part unspecified, right eye, initial encounter: Secondary | ICD-10-CM

## 2020-01-31 DIAGNOSIS — T1511XA Foreign body in conjunctival sac, right eye, initial encounter: Secondary | ICD-10-CM | POA: Insufficient documentation

## 2020-01-31 DIAGNOSIS — S0591XA Unspecified injury of right eye and orbit, initial encounter: Secondary | ICD-10-CM | POA: Diagnosis present

## 2020-01-31 MED ORDER — FLUORESCEIN SODIUM 1 MG OP STRP: 1.0000 | ORAL_STRIP | Freq: Once | OPHTHALMIC | Status: DC

## 2020-01-31 MED ORDER — TETRACAINE HCL 0.5 % OP SOLN
1.0000 [drp] | Freq: Once | OPHTHALMIC | Status: AC
  Administered 2020-01-31: 2 [drp] via OPHTHALMIC
  Filled 2020-01-31: qty 4

## 2020-01-31 MED ORDER — ERYTHROMYCIN 5 MG/GM OP OINT: 1.0000 "application " | TOPICAL_OINTMENT | Freq: Four times a day (QID) | OPHTHALMIC | 0 refills | Status: AC
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
## 2020-01-31 NOTE — Discharge Instructions (Addendum)
You were evaluated in the Emergency Department and after careful evaluation, we did not find any emergent condition requiring admission or further testing in the hospital.  Your exam/testing today was overall reassuring.  We were able to remove a small piece of debris from your eye.  Use the erythromycin ointment as directed and follow-up with your eye doctor if you have any lingering concerns.  Please return to the Emergency Department if you experience any worsening of your condition.  Thank you for allowing [place] to be a part of your care.

## 2020-01-31 NOTE — ED Notes (Signed)
Patient discharged to home.  All discharge instructions reviewed.  Patient demonstrated knowledge via teachback method.  Ambulatory out of ED.
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
## 2020-02-23 ENCOUNTER — Other Ambulatory Visit: Payer: Self-pay | Admitting: Internal Medicine

## 2020-02-23 DIAGNOSIS — Z0000 Encounter for general adult medical examination without abnormal findings: Secondary | ICD-10-CM

## 2020-05-31 ENCOUNTER — Other Ambulatory Visit: Payer: Self-pay

## 2020-05-31 ENCOUNTER — Ambulatory Visit (INDEPENDENT_AMBULATORY_CARE_PROVIDER_SITE_OTHER): Payer: Medicare Other | Admitting: Internal Medicine

## 2020-05-31 ENCOUNTER — Encounter: Payer: Self-pay | Admitting: Internal Medicine

## 2020-05-31 VITALS — BP 130/72 | HR 74 | Temp 98.7°F | Ht 67.0 in | Wt 145.2 lb

## 2020-05-31 DIAGNOSIS — L84 Corns and callosities: Secondary | ICD-10-CM | POA: Diagnosis not present

## 2020-05-31 DIAGNOSIS — N320 Bladder-neck obstruction: Secondary | ICD-10-CM | POA: Diagnosis not present

## 2020-05-31 DIAGNOSIS — R972 Elevated prostate specific antigen [PSA]: Secondary | ICD-10-CM

## 2020-05-31 DIAGNOSIS — F411 Generalized anxiety disorder: Secondary | ICD-10-CM | POA: Diagnosis not present

## 2020-05-31 DIAGNOSIS — R7309 Other abnormal glucose: Secondary | ICD-10-CM

## 2020-05-31 LAB — COMPREHENSIVE METABOLIC PANEL
ALT: 23 U/L (ref 0–53)
AST: 22 U/L (ref 0–37)
Albumin: 4.1 g/dL (ref 3.5–5.2)
Alkaline Phosphatase: 70 U/L (ref 39–117)
BUN: 16 mg/dL (ref 6–23)
CO2: 28 mEq/L (ref 19–32)
Calcium: 10.3 mg/dL (ref 8.4–10.5)
Chloride: 101 mEq/L (ref 96–112)
Creatinine, Ser: 0.91 mg/dL (ref 0.40–1.50)
GFR: 86.24 mL/min (ref >60.00)
Glucose, Bld: 83 mg/dL (ref 70–99)
Potassium: 4 mEq/L (ref 3.5–5.1)
Sodium: 139 mEq/L (ref 135–145)
Total Bilirubin: 0.9 mg/dL (ref 0.2–1.2)
Total Protein: 6.2 g/dL (ref 6.0–8.3)

## 2020-05-31 LAB — URINALYSIS
Bilirubin Urine: NEGATIVE
Hgb urine dipstick: NEGATIVE
Ketones, ur: NEGATIVE
Leukocytes,Ua: NEGATIVE
Nitrite: NEGATIVE
Specific Gravity, Urine: 1.015 (ref 1.000–1.030)
Total Protein, Urine: NEGATIVE
Urine Glucose: NEGATIVE
Urobilinogen, UA: 0.2 (ref 0.0–1.0)
pH: 7 (ref 5.0–8.0)

## 2020-05-31 LAB — CBC WITH DIFFERENTIAL/PLATELET
Basophils Absolute: 0 10*3/uL (ref 0.0–0.1)
Basophils Relative: 0.6 % (ref 0.0–3.0)
Eosinophils Absolute: 0.1 10*3/uL (ref 0.0–0.7)
Eosinophils Relative: 1.1 % (ref 0.0–5.0)
HCT: 44.9 % (ref 39.0–52.0)
Hemoglobin: 15.1 g/dL (ref 13.0–17.0)
Lymphocytes Relative: 32.9 % (ref 12.0–46.0)
Lymphs Abs: 2.8 10*3/uL (ref 0.7–4.0)
MCHC: 33.5 g/dL (ref 30.0–36.0)
MCV: 91.1 fl (ref 78.0–100.0)
Monocytes Absolute: 0.8 10*3/uL (ref 0.1–1.0)
Monocytes Relative: 9 % (ref 3.0–12.0)
Neutro Abs: 4.8 10*3/uL (ref 1.4–7.7)
Neutrophils Relative %: 56.4 % (ref 43.0–77.0)
Platelets: 226 10*3/uL (ref 150.0–400.0)
RBC: 4.93 Mil/uL (ref 4.22–5.81)
RDW: 14.5 % (ref 11.5–15.5)
WBC: 8.5 10*3/uL (ref 4.0–10.5)

## 2020-05-31 LAB — PSA: PSA: 13.19 ng/mL — ABNORMAL HIGH (ref 0.10–4.00)

## 2020-05-31 LAB — TSH: TSH: 1.27 u[IU]/mL (ref 0.35–4.50)

## 2020-05-31 NOTE — Assessment & Plan Note (Signed)
Check labs 

## 2020-05-31 NOTE — Assessment & Plan Note (Signed)
Monitoring PSA 

## 2020-05-31 NOTE — Assessment & Plan Note (Signed)
L foot See procedure

## 2020-05-31 NOTE — Assessment & Plan Note (Signed)
Check PSA. ?

## 2020-05-31 NOTE — Progress Notes (Signed)
Subjective:  Patient ID: Joseph Preston, male    DOB: 09/16/1950  Age: 70 y.o. MRN: [id number]  CC: Follow-up (6 month f/u)   HPI Joseph Preston presents for R foot lesion - painful C/o hearing C/o sinus drainage   Outpatient Medications Prior to Visit  Medication Sig Dispense Refill  . ALPRAZolam (XANAX) 0.5 MG tablet Take 1 tablet (0.5 mg total) by mouth 2 (two) times daily as needed. for anxiety 180 tablet 1  . Cholecalciferol (VITAMIN D) 2000 units CAPS 1 po qd 100 capsule 3  . cyclobenzaprine (FLEXERIL) 5 MG tablet TAKE 1 TABLET BY MOUTH TWICE DAILY AS NEEDED FOR MUSCLE SPASM 180 tablet 0  . fexofenadine-pseudoephedrine (ALLEGRA-D ALLERGY & CONGESTION) 180-240 MG 24 hr tablet Take 1 tablet by mouth daily as needed. 90 tablet 1  . Fluticasone-Umeclidin-Vilant (TRELEGY ELLIPTA) 100-62.5-25 MCG/INH AEPB Inhale 1 spray into the lungs daily. 3 each 11  . hydrOXYzine (ATARAX/VISTARIL) 25 MG tablet Take 1 tablet (25 mg total) by mouth every 8 (eight) hours as needed for itching. 60 tablet 0  . hyoscyamine (LEVSIN, ANASPAZ) 0.125 MG tablet TAKE ONE TABLET BY MOUTH EVERY 6 HOURS AS NEEDED 100 tablet 1  . nortriptyline (PAMELOR) 10 MG capsule TAKE 1 TO 2 CAPSULES BY MOUTH ONCE DAILY AT BEDTIME 180 capsule 3  . polyethylene glycol powder (GLYCOLAX/MIRALAX) powder Take 17 g by mouth 2 (two) times daily as needed. 500 g 2  . sildenafil (VIAGRA) 100 MG tablet TAKE 1 TABLET BY MOUTH  DAILY AS NEEDED FOR  ERECTILE DYSFUNCTION 12 tablet 3  . tamsulosin (FLOMAX) 0.4 MG CAPS capsule Take 1 capsule (0.4 mg total) by mouth daily. 90 capsule 3  . albuterol (PROAIR HFA) 108 (90 Base) MCG/ACT inhaler Inhale 2 puffs into the lungs every 4 (four) hours as needed for wheezing or shortness of breath. 54 g 3   No facility-administered medications prior to visit.    ROS: Review of Systems  Constitutional: Negative for appetite change, fatigue and unexpected weight change.  HENT: Positive for hearing  loss. Negative for congestion, nosebleeds, sneezing, sore throat and trouble swallowing.   Eyes: Negative for itching and visual disturbance.  Respiratory: Negative for cough.   Cardiovascular: Negative for chest pain, palpitations and leg swelling.  Gastrointestinal: Negative for abdominal distention, blood in stool, diarrhea and nausea.  Genitourinary: Negative for frequency and hematuria.  Musculoskeletal: Positive for gait problem. Negative for back pain, joint swelling and neck pain.  Skin: Negative for rash.  Neurological: Negative for dizziness, tremors, speech difficulty and weakness.  Psychiatric/Behavioral: Negative for agitation, dysphoric mood and sleep disturbance. The patient is not nervous/anxious.     Objective:  BP 130/72 (BP Location: Left Arm)   Pulse 74   Temp 98.7 F (37.1 C) (Oral)   Ht 5\' 7"  (1.702 m)   Wt 145 lb 3.2 oz (65.9 kg)   SpO2 98%   BMI 22.74 kg/m   BP Readings from Last 3 Encounters:  05/31/20 130/72  01/31/20 122/80  12/02/19 (!) 142/78    Wt Readings from Last 3 Encounters:  05/31/20 145 lb 3.2 oz (65.9 kg)  01/31/20 148 lb (67.1 kg)  12/02/19 149 lb 6.4 oz (67.8 kg)    Physical Exam Constitutional:      General: He is not in acute distress.    Appearance: He is well-developed.     Comments: NAD  Eyes:     Conjunctiva/sclera: Conjunctivae normal.     Pupils: Pupils are equal,  round, and reactive to light.  Neck:     Thyroid: No thyromegaly.     Vascular: No JVD.  Cardiovascular:     Rate and Rhythm: Normal rate and regular rhythm.     Heart sounds: Normal heart sounds. No murmur heard. No friction rub. No gallop.   Pulmonary:     Effort: Pulmonary effort is normal. No respiratory distress.     Breath sounds: Normal breath sounds. No wheezing or rales.  Chest:     Chest wall: No tenderness.  Abdominal:     General: Bowel sounds are normal. There is no distension.     Palpations: Abdomen is soft. There is no mass.      Tenderness: There is no abdominal tenderness. There is no guarding or rebound.  Musculoskeletal:        General: Tenderness present. Normal range of motion.     Cervical back: Normal range of motion.  Lymphadenopathy:     Cervical: No cervical adenopathy.  Skin:    General: Skin is warm and dry.     Findings: No rash.  Neurological:     Mental Status: He is alert and oriented to person, place, and time.     Cranial Nerves: No cranial nerve deficit.     Motor: No abnormal muscle tone.     Coordination: Coordination normal.     Gait: Gait normal.     Deep Tendon Reflexes: Reflexes are normal and symmetric.  Psychiatric:        Behavior: Behavior normal.        Thought Content: Thought content normal.        Judgment: Judgment normal.    A har callus L foot Edentulous  Procedure:  L  foot callus paring/cutting  Indication:  L  foot callus, painful  Consent: verbal  Risks and benefits were explained to the patient. Skin was cleaned with alcohol. I removed a large callus carefully with a round blade. Skin remained intact. Pain is better. Tolerated well. Complications: none. Bandaid applied   Lab Results  Component Value Date   WBC 6.0 09/25/2018   HGB 13.7 09/25/2018   HCT 41.6 09/25/2018   PLT 217.0 09/25/2018   GLUCOSE 104 (H) 09/25/2018   CHOL 213 (H) 09/25/2018   TRIG 135.0 09/25/2018   HDL 82.40 09/25/2018   LDLCALC 104 (H) 09/25/2018   ALT 16 09/25/2018   AST 20 09/25/2018   NA 140 09/25/2018   K 4.8 09/25/2018   CL 102 09/25/2018   CREATININE 1.19 09/25/2018   BUN 15 09/25/2018   CO2 30 09/25/2018   TSH 1.37 09/25/2018   PSA 14.05 (H) 09/25/2018   HGBA1C 5.7 03/10/2016    No results found.  Assessment & Plan:   There are no diagnoses linked to this encounter.   No orders of the defined types were placed in this encounter.    Follow-up: No follow-ups on file.  Walker Kehr, MD

## 2020-06-11 ENCOUNTER — Telehealth: Payer: Self-pay | Admitting: Internal Medicine

## 2020-06-11 NOTE — Progress Notes (Signed)
  Chronic Care Management   Note  06/11/2020 Name: ISAACK PREBLE MRN: [id number] DOB: August 12, 1950  [name] is a 70 y.o. year old male who is a primary care patient of Plotnikov, Evie Lacks, MD. I reached out to [name] by phone today in response to a referral sent by Mr. Dorthey Sawyer Townsend's PCP, Plotnikov, Evie Lacks, MD.   Mr. Bunyard was given information about Chronic Care Management services today including:  1. CCM service includes personalized support from designated clinical staff supervised by his physician, including individualized plan of care and coordination with other care providers 2. 24/7 contact phone numbers for assistance for urgent and routine care needs. 3. Service will only be billed when office clinical staff spend 20 minutes or more in a month to coordinate care. 4. Only one practitioner may furnish and bill the service in a calendar month. 5. The patient may stop CCM services at any time (effective at the end of the month) by phone call to the office staff.   Patient agreed to services and verbal consent obtained.   Follow up plan:   [place]

## 2020-06-15 NOTE — Progress Notes (Incomplete)
Chronic Care Management Pharmacy Note  06/15/2020 Name:  Joseph Preston MRN:  [id number] DOB:  [birth year]  Subjective: Joseph Preston is an 70 y.o. year old male who is a primary patient of Plotnikov, Evie Lacks, MD.  The CCM team was consulted for assistance with disease management and care coordination needs.    Engaged with patient face to face for initial visit in response to provider referral for pharmacy case management and/or care coordination services.   Consent to Services:  The patient was given the following information about Chronic Care Management services today, agreed to services, and gave verbal consent: 1. CCM service includes personalized support from designated clinical staff supervised by the primary care provider, including individualized plan of care and coordination with other care providers 2. 24/7 contact phone numbers for assistance for urgent and routine care needs. 3. Service will only be billed when office clinical staff spend 20 minutes or more in a month to coordinate care. 4. Only one practitioner may furnish and bill the service in a calendar month. 5.The patient may stop CCM services at any time (effective at the end of the month) by phone call to the office staff. 6. The patient will be responsible for cost sharing (co-pay) of up to 20% of the service fee (after annual deductible is met). Patient agreed to services and consent obtained.  Patient Care Team: Plotnikov, Evie Lacks, MD as PCP - General (Internal Medicine) Tomasa Blase, [facility] (Pharmacist) Tomasa Blase, [facility] as Pharmacist (Pharmacist)  Recent office visits: 05/31/2020 - PCP visit - pain in left foot, callus removed  12/02/2019 - PCP visit   Recent consult visits: 08/26/2019 [facility] visits: 01/31/2020 - ED visit for evaluation of foreign body in eye - prescribed erythromycin ointment   Objective:  Lab Results  Component Value Date   CREATININE 0.91 05/31/2020   BUN 16  05/31/2020   GFR 86.24 05/31/2020   NA 139 05/31/2020   K 4.0 05/31/2020   CALCIUM 10.3 05/31/2020   CO2 28 05/31/2020   GLUCOSE 83 05/31/2020    Lab Results  Component Value Date/Time   HGBA1C 5.7 03/10/2016 02:00 PM   GFR 86.24 05/31/2020 12:08 PM   GFR 60.90 09/25/2018 10:01 AM    Last diabetic Eye exam:  No results found for: HMDIABEYEEXA  Last diabetic Foot exam:  No results found for: HMDIABFOOTEX   Lab Results  Component Value Date   CHOL 213 (H) 09/25/2018   HDL 82.40 09/25/2018   LDLCALC 104 (H) 09/25/2018   TRIG 135.0 09/25/2018   CHOLHDL 3 09/25/2018    Hepatic Function Latest Ref Rng & Units 05/31/2020 09/25/2018 04/03/2018  Total Protein 6.0 - 8.3 g/dL 6.2 6.9 7.3  Albumin 3.5 - 5.2 g/dL 4.1 4.7 4.6  AST 0 - 37 U/L _0 ALT 0 - 53 U/L _1 Alk Phosphatase 39 - 117 U/L 70 69 69  Total Bilirubin 0.2 - 1.2 mg/dL 0.9 1.1 0.9  Bilirubin, Direct 0.0 - 0.3 mg/dL - 0.2 0.2    Lab Results  Component Value Date/Time   TSH 1.27 05/31/2020 12:08 PM   TSH 1.37 09/25/2018 10:01 AM    CBC Latest Ref Rng & Units 05/31/2020 09/25/2018 04/03/2018  WBC 4.0 - 10.5 K/uL 8.5 6.0 5.5  Hemoglobin 13.0 - 17.0 g/dL 15.1 13.7 14.3  Hematocrit 39.0 - 52.0 % 44.9 41.6 42.8  Platelets 150.0 - 400.0 K/uL 226.0 217.0 249.0  Lab Results  Component Value Date/Time   VD25OH 31 06/02/2013 03:02 PM    Clinical ASCVD: No  The 10-year ASCVD risk score Mikey Bussing DC Jr., et al., 2013) is: 14.2%   Values used to calculate the score:     Age: 9 years     Sex: Male     Is Non-Hispanic African American: No     Diabetic: No     Tobacco smoker: No     Systolic Blood Pressure: 034 mmHg     Is BP treated: No     HDL Cholesterol: 82.4 mg/dL     Total Cholesterol: 213 mg/dL    Depression screen PHQ 2/9 10/10/2016  Decreased Interest 1  Down, Depressed, Hopeless 2  PHQ - 2 Score 3  Altered sleeping 2  Tired, decreased energy 2  Change in appetite 1  Feeling bad or failure about  yourself  2  Trouble concentrating 3  Moving slowly or fidgety/restless 0  Suicidal thoughts 0  PHQ-9 Score 13      Social History   Tobacco Use  Smoking Status Former Smoker  Smokeless Tobacco Former User   BP Readings from Last 3 Encounters:  05/31/20 130/72  01/31/20 122/80  12/02/19 (!) 142/78   Pulse Readings from Last 3 Encounters:  05/31/20 74  01/31/20 78  12/02/19 66   Wt Readings from Last 3 Encounters:  05/31/20 145 lb 3.2 oz (65.9 kg)  01/31/20 148 lb (67.1 kg)  12/02/19 149 lb 6.4 oz (67.8 kg)   BMI Readings from Last 3 Encounters:  05/31/20 22.74 kg/m  01/31/20 23.18 kg/m  12/02/19 23.40 kg/m    Assessment/Interventions: Review of patient past medical history, allergies, medications, health status, including review of consultants reports, laboratory and other test data, was performed as part of comprehensive evaluation and provision of chronic care management services.   SDOH:  (Social Determinants of Health) assessments and interventions performed: {yes/no:20286}  SDOH Screenings   Alcohol Screen: Not on file  Depression (VQQ5-9): Not on file  Financial Resource Strain: Not on file  Food Insecurity: Not on file  Housing: Not on file  Physical Activity: Not on file  Social Connections: Not on file  Stress: Not on file  Tobacco Use: Medium Risk  . Smoking Tobacco Use: Former Smoker  . Smokeless Tobacco Use: Former Soil scientist Needs: Not on file    [place]  Allergies  Allergen Reactions  . Penicillins Itching and Swelling  . Sulfonamide Derivatives Itching and Swelling  . Varenicline Tartrate Other (See Comments)    states that he had nightmares and felt crazy   . Tramadol Other (See Comments)    dizzy    Medications Reviewed Today    Reviewed by Cassandria Anger, MD (Physician) on 05/31/20 at 1157  Med List Status: <None>  Medication Order Taking? Sig Documenting Provider Last Dose Status Informant  albuterol  (PROAIR HFA) 108 (90 Base) MCG/ACT inhaler [id number]  Inhale 2 puffs into the lungs every 4 (four) hours as needed for wheezing or shortness of breath. Plotnikov, Evie Lacks, MD  Expired 03/20/20 2359   ALPRAZolam (XANAX) 0.5 MG tablet [id number] Yes Take 1 tablet (0.5 mg total) by mouth 2 (two) times daily as needed. for anxiety Plotnikov, Evie Lacks, MD Taking Active   Cholecalciferol (VITAMIN D) 2000 units CAPS [id number] Yes 1 po qd Plotnikov, Evie Lacks, MD Taking Active   cyclobenzaprine (FLEXERIL) 5 MG tablet [id number] Yes TAKE 1 TABLET BY MOUTH TWICE DAILY  AS NEEDED FOR MUSCLE SPASM Plotnikov, Evie Lacks, MD Taking Active   fexofenadine-pseudoephedrine (ALLEGRA-D ALLERGY & CONGESTION) 180-240 MG 24 hr tablet [id number] Yes Take 1 tablet by mouth daily as needed. Plotnikov, Evie Lacks, MD Taking Active   Fluticasone-Umeclidin-Vilant (TRELEGY ELLIPTA) 100-62.5-25 MCG/INH AEPB [id number] Yes Inhale 1 spray into the lungs daily. Plotnikov, Evie Lacks, MD Taking Active   hydrOXYzine (ATARAX/VISTARIL) 25 MG tablet [id number] Yes Take 1 tablet (25 mg total) by mouth every 8 (eight) hours as needed for itching. Plotnikov, Evie Lacks, MD Taking Active   hyoscyamine (LEVSIN, ANASPAZ) 0.125 MG tablet [id number] Yes TAKE ONE TABLET BY MOUTH EVERY 6 HOURS AS NEEDED Plotnikov, Evie Lacks, MD Taking Active   nortriptyline (PAMELOR) 10 MG capsule [id number] Yes TAKE 1 TO 2 CAPSULES BY MOUTH ONCE DAILY AT BEDTIME Plotnikov, Evie Lacks, MD Taking Active   polyethylene glycol powder (GLYCOLAX/MIRALAX) powder [id number] Yes Take 17 g by mouth 2 (two) times daily as needed. Plotnikov, Evie Lacks, MD Taking Active   sildenafil (VIAGRA) 100 MG tablet [id number] Yes TAKE 1 TABLET BY MOUTH  DAILY AS NEEDED FOR  ERECTILE DYSFUNCTION Plotnikov, Evie Lacks, MD Taking Active   tamsulosin (FLOMAX) 0.4 MG CAPS capsule [id number] Yes Take 1 capsule (0.4 mg total) by mouth daily. Plotnikov, Evie Lacks, MD Taking Active           Patient Active  Problem List   Diagnosis Date Noted  . Callus of foot 05/31/2020  . Urticaria 07/09/2019  . Ascariasis 04/03/2018  . Acute bronchitis 04/30/2017  . COPD exacerbation ([place]) 04/30/2017  . 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 Chronic constipation 10/10/2016  . Ear itching 05/02/2016  . Neoplasm of uncertain behavior of skin 04/14/2016  . Elevated glucose 03/10/2016  . Insomnia 03/10/2016  . Low back pain radiating to left lower extremity 08/09/2015  . Dental caries extending into pulp 10/06/2014  . 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 Acute upper respiratory infection 04/07/2014  . Elevated PSA 08/12/2013  . Well adult exam 06/02/2013  . Mouth sore 06/02/2013  . Irritable bowel syndrome (IBS) 06/02/2013  . Lumbar radiculopathy, chronic 05/09/2013  . Piriformis syndrome of left side 04/17/2013  . PREMATURE EJACULATION 03/11/2007  . ERECTILE DYSFUNCTION 03/11/2007  . Generalized anxiety disorder 09/20/2006  . SMOKER 09/20/2006  . ALLERGIC RHINITIS 09/20/2006  . COPD (chronic obstructive pulmonary disease) ([place]) 09/20/2006  . PROSTATITIS, ACUTE 09/20/2006  . ANKLE PAIN, RIGHT 09/20/2006  . COLONIC POLYPS, HX OF 09/20/2006    Immunization History  Administered Date(s) Administered  . Hepatitis B, adult 12/18/2013, 01/28/2014, 06/10/2014  . Influenza Whole 02/27/2005  . Tdap 01/05/2015    Conditions to be addressed/monitored:  COPD, Anxiety, BPH, Allergic Rhinitis and Chronic pain   There are no care plans that you recently modified to display for this patient.     Medication Assistance: {MEDASSISTANCEINFO:25044}  Patient's preferred pharmacy is:  [facility], [address], [address] [place], [address] [place] [address] Phone: [phone] Fax: [phone]   Uses pill box? {Yes or If no, why not?:20788} Pt endorses ***% compliance  Care Plan and Follow Up Patient Decision:  {FOLLOWUP:24991}  Plan: {CM FOLLOW UP  UMPN:36144}  Current Barriers:  . {pharmacybarriers:24917}  Pharmacist Clinical Goal(s):  Marland Kitchen Patient will {PHARMACYGOALCHOICES:24921} through collaboration with PharmD and provider.   Interventions: . 1:1 collaboration with Plotnikov, Evie Lacks, MD regarding development and update of comprehensive plan of care as evidenced by provider attestation and  co-signature . Inter-disciplinary care team collaboration (see longitudinal plan of care) . Comprehensive medication review performed; medication list updated in electronic medical record  COPD (Goal: control symptoms and prevent exacerbations) -{US controlled/uncontrolled:25276} -Current treatment  . Trelegy Ellipta 100-62.5-281mcg/ act - 1 puff daily  . Proair HFA - 2 puffs q4h prn  -Medications previously tried: ***  -Gold Grade: {CHL HP Upstream Pharm COPD Gold ATFTD:[id number]} -Current COPD Classification:  {CHL HP Upstream Pharm COPD [phone]} -MMRC/CAT score: *** -Pulmonary function testing: ***  HAS SPIROMETRY EVER BEEN COMPLETED?  PERFORM MMRC  -Exacerbations requiring treatment in last 6 months: n/a -Patient {Actions; denies-reports:120008} consistent use of maintenance inhaler -Frequency of rescue inhaler use: *** -Counseled on {CCMINHALERCOUNSELING:25121} -{CCMPHARMDINTERVENTION:25122}  Depression/Anxiety (Goal: prevention of anxiety attacks) -{US controlled/uncontrolled:25276} -Current treatment: . Alprazolam 0.59m - 1 tablet BID prn  . Elavil 162m- 1-2 tablets HS  -Medications previously tried/failed: *** -PHQ9: *** -GAD7: ***  COMPLETE PHQ9 and GAD& score  -Connected with *** for mental health support -Educated on {CCM mental health counseling:25127} -{CCMPHARMDINTERVENTION:25122}  Chronic Back Pain (Goal: pain control / prevention of exacerbations) -{US controlled/uncontrolled:25276} -Current treatment  . Flexeril 81m681mID   -Medications previously tried: Tramadol, Norco, Hysingla ER,  Voltaren, Naproxen   -{CCMPHARMDINTERVENTION:25122}   BPH (Goal: Maintain control / prevention progression) -Controlled -Current treatment  . Tamsulosin 0.4mg51mily  -Medications previously tried: ***  -{CCMPHARMDINTERVENTION:25122}   Allergic Rhinitis (Goal: Prevention of flares / treatment plan) -{US controlled/uncontrolled:25276} -Current treatment  . Hydroxyzine 281mg781m tablet q8h prn itching . Allegra D 180-240mg 61mtablet daily   -Medications previously tried: ***  -{CCMPHARMDINTERVENTION:25122}     Health Maintenance -Vaccine gaps: *** -Current therapy:  . Viagra 100mg -31mablet daily prn . Hyoscyamine 0.1281mg - 63mblet q6h prn  . Miralax - 17g  2 times daily as needed  . Vitamin D 2000 units daily  -Educated on {ccm supplement counseling:25128} -{CCM Patient satisfied:25129} -{CCMPHARMDINTERVENTION:25122}   Patient Goals/Self-Care Activities . Patient will:  - {pharmacypatientgoals:24919}  Follow Up Plan: {CM FOLLOW UP PLAN:222WCBJ:62831}

## 2020-06-16 ENCOUNTER — Telehealth: Payer: Self-pay

## 2020-06-16 NOTE — Telephone Encounter (Signed)
AWV appt scheduled.

## 2020-06-18 ENCOUNTER — Other Ambulatory Visit: Payer: Self-pay

## 2020-06-18 ENCOUNTER — Ambulatory Visit: Payer: Medicare Other

## 2020-06-18 ENCOUNTER — Ambulatory Visit (INDEPENDENT_AMBULATORY_CARE_PROVIDER_SITE_OTHER): Payer: Medicare Other

## 2020-06-18 DIAGNOSIS — M5416 Radiculopathy, lumbar region: Secondary | ICD-10-CM

## 2020-06-18 DIAGNOSIS — J449 Chronic obstructive pulmonary disease, unspecified: Secondary | ICD-10-CM | POA: Diagnosis not present

## 2020-06-18 DIAGNOSIS — F17200 Nicotine dependence, unspecified, uncomplicated: Secondary | ICD-10-CM

## 2020-06-18 DIAGNOSIS — M79605 Pain in left leg: Secondary | ICD-10-CM

## 2020-06-18 DIAGNOSIS — F411 Generalized anxiety disorder: Secondary | ICD-10-CM

## 2020-06-18 NOTE — Progress Notes (Addendum)
Chronic Care Management Pharmacy Note  06/18/2020 Name:  Joseph Preston MRN:  [id number] DOB:  1950-04-05  Subjective: Joseph Preston is an 70 y.o. year old male who is a primary patient of Plotnikov, Evie Lacks, MD.  The CCM team was consulted for assistance with disease management and care coordination needs.    Engaged with patient by telephone for initial visit in response to provider referral for pharmacy case management and/or care coordination services.   Consent to Services:  The patient was given the following information about Chronic Care Management services today, agreed to services, and gave verbal consent: 1. CCM service includes personalized support from designated clinical staff supervised by the primary care provider, including individualized plan of care and coordination with other care providers 2. 24/7 contact phone numbers for assistance for urgent and routine care needs. 3. Service will only be billed when office clinical staff spend 20 minutes or more in a month to coordinate care. 4. Only one practitioner may furnish and bill the service in a calendar month. 5.The patient may stop CCM services at any time (effective at the end of the month) by phone call to the office staff. 6. The patient will be responsible for cost sharing (co-pay) of up to 20% of the service fee (after annual deductible is met). Patient agreed to services and consent obtained.  Patient Care Team: Plotnikov, Evie Lacks, MD as PCP - General (Internal Medicine) Tomasa Blase, [facility] (Pharmacist) Tomasa Blase, [facility] as Pharmacist (Pharmacist)  Recent office visits: 05/31/2020 - PCP visit - pain in left foot, callus removed  12/02/2019 - PCP visit   Recent consult visits: 08/26/2019 [facility] visits: 01/31/2020 - ED visit for evaluation of foreign body in eye - prescribed erythromycin ointment  Objective:  Lab Results  Component Value Date   CREATININE 0.91 05/31/2020   BUN 16  05/31/2020   GFR 86.24 05/31/2020   NA 139 05/31/2020   K 4.0 05/31/2020   CALCIUM 10.3 05/31/2020   CO2 28 05/31/2020   GLUCOSE 83 05/31/2020    Lab Results  Component Value Date/Time   HGBA1C 5.7 03/10/2016 02:00 PM   GFR 86.24 05/31/2020 12:08 PM   GFR 60.90 09/25/2018 10:01 AM    Last diabetic Eye exam:  No results found for: HMDIABEYEEXA  Last diabetic Foot exam:  No results found for: HMDIABFOOTEX   Lab Results  Component Value Date   CHOL 213 (H) 09/25/2018   HDL 82.40 09/25/2018   LDLCALC 104 (H) 09/25/2018   TRIG 135.0 09/25/2018   CHOLHDL 3 09/25/2018    Hepatic Function Latest Ref Rng & Units 05/31/2020 09/25/2018 04/03/2018  Total Protein 6.0 - 8.3 g/dL 6.2 6.9 7.3  Albumin 3.5 - 5.2 g/dL 4.1 4.7 4.6  AST 0 - 37 U/L $Remo'22 20 22  'xFLXq$ ALT 0 - 53 U/L $Remo'23 16 20  'VEBSW$ Alk Phosphatase 39 - 117 U/L 70 69 69  Total Bilirubin 0.2 - 1.2 mg/dL 0.9 1.1 0.9  Bilirubin, Direct 0.0 - 0.3 mg/dL - 0.2 0.2    Lab Results  Component Value Date/Time   TSH 1.27 05/31/2020 12:08 PM   TSH 1.37 09/25/2018 10:01 AM    CBC Latest Ref Rng & Units 05/31/2020 09/25/2018 04/03/2018  WBC 4.0 - 10.5 K/uL 8.5 6.0 5.5  Hemoglobin 13.0 - 17.0 g/dL 15.1 13.7 14.3  Hematocrit 39.0 - 52.0 % 44.9 41.6 42.8  Platelets 150.0 - 400.0 K/uL 226.0 217.0 249.0  Lab Results  Component Value Date/Time   VD25OH 31 06/02/2013 03:02 PM    Clinical ASCVD: No  The 10-year ASCVD risk score Mikey Bussing DC Jr., et al., 2013) is: 17.7%   Values used to calculate the score:     Age: 84 years     Sex: Male     Is Non-Hispanic African American: No     Diabetic: No     Tobacco smoker: Yes     Systolic Blood Pressure: 606 mmHg     Is BP treated: No     HDL Cholesterol: 82.4 mg/dL     Total Cholesterol: 213 mg/dL    Depression screen [facility] 2/9 06/18/2020 10/10/2016  Decreased Interest 0 1  Down, Depressed, Hopeless 1 2  PHQ - 2 Score 1 3  Altered sleeping 1 2  Tired, decreased energy 0 2  Change in appetite 1 1  Feeling  bad or failure about yourself  0 2  Trouble concentrating 2 3  Moving slowly or fidgety/restless 0 0  Suicidal thoughts 0 0  PHQ-9 Score 5 13   CAT score - completed 06/18/2020 - 13   Social History   Tobacco Use  Smoking Status Current Every Day Smoker   Packs/day: 1.00   Years: 19.00   Pack years: 19.00   Types: Cigarettes  Smokeless Tobacco Former Systems developer  Tobacco Comment   working towards being ready to quit   BP Readings from Last 3 Encounters:  05/31/20 130/72  01/31/20 122/80  12/02/19 (!) 142/78   Pulse Readings from Last 3 Encounters:  05/31/20 74  01/31/20 78  12/02/19 66   Wt Readings from Last 3 Encounters:  05/31/20 145 lb 3.2 oz (65.9 kg)  01/31/20 148 lb (67.1 kg)  12/02/19 149 lb 6.4 oz (67.8 kg)   BMI Readings from Last 3 Encounters:  05/31/20 22.74 kg/m  01/31/20 23.18 kg/m  12/02/19 23.40 kg/m    Assessment/Interventions: Review of patient past medical history, allergies, medications, health status, including review of consultants reports, laboratory and other test data, was performed as part of comprehensive evaluation and provision of chronic care management services.   SDOH:  (Social Determinants of Health) assessments and interventions performed: Yes SDOH Interventions    Flowsheet Row Most Recent Value  SDOH Interventions   Depression Interventions/Treatment  Currently on Treatment, Medication      SDOH Screenings   Alcohol Screen: Not on file  Depression (PHQ2-9): Medium Risk   PHQ-2 Score: 5  Financial Resource Strain: Medium Risk   Difficulty of Paying Living Expenses: Somewhat hard  Food Insecurity: Not on file  Housing: Not on file  Physical Activity: Not on file  Social Connections: Not on file  Stress: Not on file  Tobacco Use: High Risk   Smoking Tobacco Use: Current Every Day Smoker   Smokeless Tobacco Use: Former Soil scientist Needs: Not on file    [place]  Allergies  Allergen Reactions    Penicillins Itching and Swelling   Sulfonamide Derivatives Itching and Swelling   Varenicline Tartrate Other (See Comments)    states that he had nightmares and felt crazy    Tramadol Other (See Comments)    dizzy    Medications Reviewed Today     Reviewed by Tomasa Blase, [facility] (Pharmacist) on 06/18/20 at [place] List Status: <None>   Medication Order Taking? Sig Documenting Provider Last Dose Status Informant  albuterol (PROAIR HFA) 108 (90 Base) MCG/ACT inhaler [id number]  Inhale 2  puffs into the lungs every 4 (four) hours as needed for wheezing or shortness of breath. Plotnikov, Evie Lacks, MD  Expired 03/20/20 2359            Med Note (Glenden Rossell C   Fri Jun 18, 2020  2:03 PM) Only having to use a few times / week  ALPRAZolam (XANAX) 0.5 MG tablet [id number] Yes Take 1 tablet (0.5 mg total) by mouth 2 (two) times daily as needed. for anxiety Plotnikov, Evie Lacks, MD Taking Active            Med Note Delice Bison, Darnelle Maffucci   Fri Jun 18, 2020  2:03 PM) Only having to use a few times per week    Cholecalciferol (VITAMIN D) 2000 units CAPS [id number] Yes 1 po qd Plotnikov, Evie Lacks, MD Taking Active   cyclobenzaprine (FLEXERIL) 5 MG tablet [id number] Yes TAKE 1 TABLET BY MOUTH TWICE DAILY AS NEEDED FOR MUSCLE SPASM  Patient taking differently: Take 5 mg by mouth. TAKE 1 TABLET BY MOUTH TWICE DAILY AS NEEDED FOR MUSCLE SPASM   Plotnikov, Evie Lacks, MD Taking Active            Med Note Delice Bison, Darnelle Maffucci   Fri Jun 18, 2020  2:04 PM) Usually only taking at bedtime, at times will take an additional time in the day    fexofenadine (ALLEGRA) 180 MG tablet [id number] Yes Take 180 mg by mouth daily. [provider] Taking Active            Med Note Delice Bison, Darnelle Maffucci   Fri Jun 18, 2020  2:05 PM) Takes if he does not take the allegraD   fexofenadine-pseudoephedrine (ALLEGRA-D ALLERGY & CONGESTION) 180-240 MG 24 hr tablet [id number] Yes Take 1 tablet by mouth daily as needed. Plotnikov,  Evie Lacks, MD Taking Active            Med Note Delice Bison, Darnelle Maffucci   Fri Jun 18, 2020  2:05 PM) Takes 3 times per week  Fluticasone-Umeclidin-Vilant (TRELEGY ELLIPTA) 100-62.5-25 MCG/INH AEPB [id number]  Inhale 1 spray into the lungs daily. Plotnikov, Evie Lacks, MD  Active   hydrOXYzine (ATARAX/VISTARIL) 25 MG tablet [id number] Yes Take 1 tablet (25 mg total) by mouth every 8 (eight) hours as needed for itching. Plotnikov, Evie Lacks, MD Taking Active   hyoscyamine (LEVSIN, ANASPAZ) 0.125 MG tablet [id number] Yes TAKE ONE TABLET BY MOUTH EVERY 6 HOURS AS NEEDED  Patient taking differently: Take 0.125 mg by mouth every 6 (six) hours as needed.   Plotnikov, Evie Lacks, MD Taking Active   nortriptyline (PAMELOR) 10 MG capsule [id number] Yes TAKE 1 TO 2 CAPSULES BY MOUTH ONCE DAILY AT BEDTIME Plotnikov, Evie Lacks, MD Taking Active   polyethylene glycol powder (GLYCOLAX/MIRALAX) powder [id number] No Take 17 g by mouth 2 (two) times daily as needed.  Patient not taking: Reported on 06/18/2020   Plotnikov, Evie Lacks, MD Not Taking Active   sildenafil (VIAGRA) 100 MG tablet [id number] Yes TAKE 1 TABLET BY MOUTH  DAILY AS NEEDED FOR  ERECTILE DYSFUNCTION Plotnikov, Evie Lacks, MD Taking Active   tamsulosin (FLOMAX) 0.4 MG CAPS capsule [id number] Yes Take 1 capsule (0.4 mg total) by mouth daily. 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   Acute upper respiratory  infection 04/07/2014   Elevated PSA 08/12/2013   Well adult exam 06/02/2013   Mouth sore 06/02/2013   Irritable bowel syndrome (IBS) 06/02/2013   Lumbar radiculopathy, chronic 05/09/2013   Piriformis syndrome of left side 04/17/2013   PREMATURE EJACULATION 03/11/2007   ERECTILE DYSFUNCTION 03/11/2007   Generalized anxiety disorder 09/20/2006   SMOKER 09/20/2006   ALLERGIC RHINITIS 09/20/2006   COPD (chronic obstructive pulmonary disease) ([place]) 09/20/2006   PROSTATITIS, ACUTE 09/20/2006   ANKLE PAIN, RIGHT 09/20/2006   COLONIC POLYPS, HX OF 09/20/2006    Immunization History  Administered Date(s) Administered   Hepatitis B, adult 12/18/2013, 01/28/2014, 06/10/2014   Influenza Whole 02/27/2005   Tdap 01/05/2015    Conditions to be addressed/monitored:  COPD, Anxiety, BPH, Allergic Rhinitis and Chronic pain   Care Plan : CCM Care Plan  Updates made by Tomasa Blase, [place] since 06/18/2020 12:00 AM     Problem: COPD, Depression, Smoking Cessation, Chronic Pain, BPH   Priority: High  Onset Date: 06/18/2020     Long-Range Goal: Disease Management   Start Date: 06/18/2020  Expected End Date: 09/18/2020  This Visit's Progress: On track  Priority: High  Note:    Current Barriers:  Unable to independently monitor therapeutic efficacy Does not adhere to prescribed medication regimen  Pharmacist Clinical Goal(s):  Patient will verbalize ability to afford treatment regimen achieve adherence to monitoring guidelines and medication adherence to achieve therapeutic efficacy achieve control of COPD as evidenced by less frequent use of rescue inhaler contact provider office for questions/concerns as evidenced notation of same in electronic health record through collaboration with PharmD and provider.   Interventions: 1:1 collaboration with Plotnikov, Evie Lacks, MD regarding development and update of comprehensive plan of care as evidenced by provider attestation and  co-signature Inter-disciplinary care team collaboration (see longitudinal plan of care) Comprehensive medication review performed; medication list updated in electronic medical record  COPD (Goal: control symptoms and prevent exacerbations) -Not ideally controlled -Current treatment  -Trelegy Ellipta 100-62.5-25mcg/ act - 1 puff daily - only has been taking prn  -Ventolin HFA - 2 puffs q4h prn  -Medications previously tried: n/a  -Gold Grade: could not determine, spirometry has not yet been completed  -Current COPD Classification:   Could not complete as spirometry has not yet been completed  -MMRC/CAT score: CAT score = 13 -Pulmonary function testing: not yet completed  -Exacerbations requiring treatment in last 6 months: n/a -Patient denies consistent use of maintenance inhaler -Frequency of rescue inhaler use: 1-2 times per week - effective when used  -Counseled on [place] inhaler technique; Benefits of consistent maintenance inhaler use When to use rescue inhaler Differences between maintenance and rescue inhalers -Recommended for patient to stop trelegy - instead will step down therapy to Incruse ellipta 62.59mcg/act - 1 puff daily   Tobacco use (Goal Smoking Cessation) -Not ideally controlled -Previous quit attempts: patient had quit smoking in 1990 - started smoking again in 2001 -Current treatment  N/a -Patient smokes Within 30 minutes of waking -Patient triggers include: stress and drinking coffee -On a scale of 1-10, reports MOTIVATION to quit is  5 -On a scale of 1-10, reports CONFIDENCE in quitting is 5 -Counseled on patch placement, side effects, and option to remove at night if they experience trouble sleeping or bad dreams.  Counseled on park & chew method for NRT gum. Provided contact information for [place] [facility]uit Line (1-800-QUIT-NOW) and encouraged patient to reach out to this group for support. -Recommended for patient to set a quit date, reviewed with patient different  medications / NRTs that could be used to aid in smoking cessation, patient will continue to work to set smoking cessation date - will call with any issues or concerns   Depression/Anxiety (Goal: prevention of anxiety attacks) -Controlled -Current treatment: Alprazolam 0.5mg  - 1 tablet BID prn  Elavil 10mg  - 1-2 tablets HS  -Medications previously tried/failed: n/a -PHQ9: 5 -GAD7: 5 COMPLETE PHQ9 and GAD& score  -Educated on Benefits of medication for symptom control -Recommended to continue current medication  Chronic Back Pain / spasms (Goal: pain control / prevention of exacerbations) -Controlled -Current treatment  Flexeril 5mg  BID - typically only taking once daily HS   -Medications previously tried: Tramadol, Norco, Hysingla ER, Voltaren, Naproxen   -Recommended to continue current medication  BPH (Goal: Maintain control / prevention progression) -Controlled -Current treatment  Tamsulosin 0.4mg  daily  -Medications previously tried: n/a  -Recommended to continue current medication   Allergic Rhinitis (Goal: Prevention of flares / treatment plan) -Controlled -Current treatment  Hydroxyzine 25mg  - 1 tablet q8h prn itching Allegra D 180-240mg  - 1 tablet daily   - Taking TIW  Allegra 180mg  - 1 tablet daily - takes on days that he does not take allegra D -Medications previously tried: n/a  -Recommended to continue current medication   Health Maintenance -Current therapy:  Viagra 100mg  - 1 tablet daily prn Hyoscyamine 0.125mg  - 1 tablet q6h prn - for acid reflux if needed   Miralax - 17g  2 times daily as needed  Vitamin D 2000 units daily  Magnesium 250mg  - 1 tablet daily - usually takes 1 day weekly (for sleep) Vitamin C 500mg  daily  -Educated on Herbal supplement research is limited and benefits usually cannot be proven Cost vs benefit of each product must be carefully weighed by individual consumer -Patient is satisfied with current therapy and denies  issues -Recommended to continue current medication   Patient Goals/Self-Care Activities Patient will:  - take medications as prescribed focus on medication adherence by using maintenance inhaler daily as prescribed for COPD control        Medication Assistance:  none required at this time  Patient's preferred pharmacy is:  [facility], [address], [address], [place] [address] [place] [address] Phone: [phone] Fax: [phone]   Uses pill box? No Pt endorses 75% compliance  Care Plan and Follow Up Patient Decision:  Patient agrees to Care Plan and Follow-up.  Plan: Telephone follow up appointment with care management team member scheduled for:  1 month  and The patient has been provided with contact information for the care management team and has been advised to call with any health related questions or concerns.   Medical screening examination/treatment/procedure(s) were performed by non-physician practitioner and as supervising physician I was immediately available for consultation/collaboration.  I agree with above. Lew Dawes, MD

## 2020-06-18 NOTE — Patient Instructions (Addendum)
Visit Information   PATIENT GOALS:  Goals Addressed            This Visit's Progress   . Track and Manage My Symptoms-COPD       Timeframe:  Long-Range Goal Priority:  High Start Date:   06/18/2020                          Expected End Date:  09/18/2020                    Follow Up Date 07/19/2020   - begin a symptom diary - eliminate symptom triggers at home - follow rescue plan if symptoms flare-up - keep follow-up appointments    Why is this important?    Tracking your symptoms and other information about your health helps your doctor plan your care.   Write down the symptoms, the time of day, what you were doing and what medicine you are taking.   You will soon learn how to manage your symptoms.     Notes:        Consent to CCM Services: Joseph Preston was given information about Chronic Care Management services today including:  1. CCM service includes personalized support from designated clinical staff supervised by his physician, including individualized plan of care and coordination with other care providers 2. 24/7 contact phone numbers for assistance for urgent and routine care needs. 3. Service will only be billed when office clinical staff spend 20 minutes or more in a month to coordinate care. 4. Only one practitioner may furnish and bill the service in a calendar month. 5. The patient may stop CCM services at any time (effective at the end of the month) by phone call to the office staff. 6. The patient will be responsible for cost sharing (co-pay) of up to 20% of the service fee (after annual deductible is met).  Patient agreed to services and verbal consent obtained.   Patient verbalizes understanding of instructions provided today and agrees to view in [place].   Telephone follow up appointment with care management team member scheduled for: The patient has been provided with contact information for the care management team and has been advised to call with  any health related questions or concerns.   Tomasa Blase, PharmD Clinical Pharmacist, [place]   CLINICAL CARE PLAN: Patient Care Plan: CCM Care Plan    Problem Identified: COPD, Depression, Smoking Cessation, Chronic Pain, BPH   Priority: High  Onset Date: 06/18/2020    Long-Range Goal: Disease Management   Start Date: 06/18/2020  Expected End Date: 09/18/2020  This Visit's Progress: On track  Priority: High  Note:    Current Barriers:  . Unable to independently monitor therapeutic efficacy . Does not adhere to prescribed medication regimen  Pharmacist Clinical Goal(s):  Marland Kitchen Patient will verbalize ability to afford treatment regimen . achieve adherence to monitoring guidelines and medication adherence to achieve therapeutic efficacy . achieve control of COPD as evidenced by less frequent use of rescue inhaler . contact provider office for questions/concerns as evidenced notation of same in electronic health record through collaboration with PharmD and provider.   Interventions: . 1:1 collaboration with Plotnikov, Evie Lacks, MD regarding development and update of comprehensive plan of care as evidenced by provider attestation and co-signature . Inter-disciplinary care team collaboration (see longitudinal plan of care) . Comprehensive medication review performed; medication list updated in electronic medical record  COPD (Goal:  control symptoms and prevent exacerbations) -Not ideally controlled -Current treatment  -Trelegy Ellipta 100-62.5-25mcg/ act - 1 puff daily - only has been taking prn  -Ventolin HFA - 2 puffs q4h prn  -Medications previously tried: n/a  -Gold Grade: could not determine, spirometry has not yet been completed  -Current COPD Classification:   Could not complete as spirometry has not yet been completed  -MMRC/CAT score: CAT score = 13 -Pulmonary function testing: not yet completed  -Exacerbations requiring treatment in last 6 months:  n/a -Patient denies consistent use of maintenance inhaler -Frequency of rescue inhaler use: 1-2 times per week - effective when used  -Counseled on Proper inhaler technique; Benefits of consistent maintenance inhaler use When to use rescue inhaler Differences between maintenance and rescue inhalers -Recommended for patient to stop trelegy - instead will step down therapy to Incruse ellipta 62.16mcg/act - 1 puff daily   Tobacco use (Goal Smoking Cessation) -Not ideally controlled -Previous quit attempts: patient had quit smoking in 1990 - started smoking again in 2001 -Current treatment  . N/a -Patient smokes Within 30 minutes of waking -Patient triggers include: stress and drinking coffee -On a scale of 1-10, reports MOTIVATION to quit is 5 -On a scale of 1-10, reports CONFIDENCE in quitting is 5 -Counseled on patch placement, side effects, and option to remove at night if they experience trouble sleeping or bad dreams.  Counseled on park & chew method for NRT gum. Provided contact information for [place] [facility] (1-800-QUIT-NOW) and encouraged patient to reach out to this group for support. -Recommended for patient to set a quit date, reviewed with patient different medications / NRTs that could be used to aid in smoking cessation, patient will continue to work to set smoking cessation date - will call with any issues or concerns   Depression/Anxiety (Goal: prevention of anxiety attacks) -Controlled -Current treatment: . Alprazolam 0.$RemoveBefore'5mg'XSwNBaqwafUAC$  - 1 tablet BID prn  . Elavil $Remove'10mg'HVaYDEz$  - 1-2 tablets HS  -Medications previously tried/failed: n/a -PHQ9: 5 -GAD7: 5 COMPLETE PHQ9 and GAD& score  -Educated on Benefits of medication for symptom control -Recommended to continue current medication  Chronic Back Pain / spasms (Goal: pain control / prevention of exacerbations) -Controlled -Current treatment  . Flexeril $RemoveBe'5mg'qxzYxzoNf$  BID - typically only taking once daily HS   -Medications previously tried: Tramadol,  Norco, Hysingla ER, Voltaren, Naproxen   -Recommended to continue current medication  BPH (Goal: Maintain control / prevention progression) -Controlled -Current treatment  . Tamsulosin 0.$RemoveBefore'4mg'tjGLrrQFlYOvQ$  daily  -Medications previously tried: n/a  -Recommended to continue current medication   Allergic Rhinitis (Goal: Prevention of flares / treatment plan) -Controlled -Current treatment  . Hydroxyzine $RemoveBefor'25mg'fjgpZjAORtGO$  - 1 tablet q8h prn itching . Allegra D 180-$RemoveBefor'240mg'XOHOaooUinzX$  - 1 tablet daily   - Taking TIW  . Allegra $RemoveB'180mg'XLNdUWZU$  - 1 tablet daily - takes on days that he does not take allegra D -Medications previously tried: n/a  -Recommended to continue current medication   Health Maintenance -Current therapy:  . Viagra $Remove'100mg'PHakRBY$  - 1 tablet daily prn . Hyoscyamine 0.$RemoveBeforeD'125mg'nCgmHJkmpubVbc$  - 1 tablet q6h prn - for acid reflux if needed   . Miralax - 17g  2 times daily as needed  . Vitamin D 2000 units daily  . Magnesium $RemoveBef'250mg'HLVAzrTAXx$  - 1 tablet daily - usually takes 1 day weekly (for sleep) . Vitamin C $RemoveB'500mg'deFuATOH$  daily  -Educated on Herbal supplement research is limited and benefits usually cannot be proven Cost vs benefit of each product must be carefully weighed by individual consumer -  Patient is satisfied with current therapy and denies issues -Recommended to continue current medication   Patient Goals/Self-Care Activities . Patient will:  - take medications as prescribed focus on medication adherence by using maintenance inhaler daily as prescribed for COPD control      Chronic Obstructive Pulmonary Disease  Chronic obstructive pulmonary disease (COPD) is a long-term (chronic) lung problem. When you have COPD, it is hard for air to get in and out of your lungs. Usually the condition gets worse over time, and your lungs will never return to normal. There are things you can do to keep yourself as healthy as possible. What are the causes?  Smoking. This is the most common cause.  Certain genes passed from parent to child (inherited). What  increases the risk?  Being exposed to secondhand smoke from cigarettes, pipes, or cigars.  Being exposed to chemicals and other irritants, such as fumes and dust in the work environment.  Having chronic lung conditions or infections. What are the signs or symptoms?  Shortness of breath, especially during physical activity.  A long-term cough with a large amount of thick mucus. Sometimes, the cough may not have any mucus (dry cough).  Wheezing.  Breathing quickly.  Skin that looks gray or blue, especially in the fingers, toes, or lips.  Feeling tired (fatigue).  Weight loss.  Chest tightness.  Having infections often.  Episodes when breathing symptoms become much worse (exacerbations). At the later stages of this disease, you may have swelling in the ankles, feet, or legs. How is this treated?  Taking medicines.  Quitting smoking, if you smoke.  Rehabilitation. This includes steps to make your body work better. It may involve a team of specialists.  Doing exercises.  Making changes to your diet.  Using oxygen.  Lung surgery.  Lung transplant.  Comfort measures (palliative care). Follow these instructions at home: Medicines  Take over-the-counter and prescription medicines only as told by your doctor.  Talk to your doctor before taking any cough or allergy medicines. You may need to avoid medicines that cause your lungs to be dry. Lifestyle  If you smoke, stop smoking. Smoking makes the problem worse.  Do not smoke or use any products that contain nicotine or tobacco. If you need help quitting, ask your doctor.  Avoid being around things that make your breathing worse. This may include smoke, chemicals, and fumes.  Stay active, but remember to rest as well.  Learn and use tips on how to manage stress and control your breathing.  Make sure you get enough sleep. Most adults need at least 7 hours of sleep every night.  Eat healthy foods. Eat smaller  meals more often. Rest before meals. Controlled breathing Learn and use tips on how to control your breathing as told by your doctor. Try:  Breathing in (inhaling) through your nose for 1 second. Then, pucker your lips and breath out (exhale) through your lips for 2 seconds.  Putting one hand on your belly (abdomen). Breathe in slowly through your nose for 1 second. Your hand on your belly should move out. Pucker your lips and breathe out slowly through your lips. Your hand on your belly should move in as you breathe out.   Controlled coughing Learn and use controlled coughing to clear mucus from your lungs. Follow these steps: 1. Lean your head a little forward. 2. Breathe in deeply. 3. Try to hold your breath for 3 seconds. 4. Keep your mouth slightly open while coughing 2  times. 5. Spit any mucus out into a tissue. 6. Rest and do the steps again 1 or 2 times as needed. General instructions  Make sure you get all the shots (vaccines) that your doctor recommends. Ask your doctor about a flu shot and a pneumonia shot.  Use oxygen therapy and pulmonary rehabilitation if told by your doctor. If you need home oxygen therapy, ask your doctor if you should buy a tool to measure your oxygen level (oximeter).  Make a COPD action plan with your doctor. This helps you to know what to do if you feel worse than usual.  Manage any other conditions you have as told by your doctor.  Avoid going outside when it is very hot, cold, or humid.  Avoid people who have a sickness you can catch (contagious).  Keep all follow-up visits. Contact a doctor if:  You cough up more mucus than usual.  There is a change in the color or thickness of the mucus.  It is harder to breathe than usual.  Your breathing is faster than usual.  You have trouble sleeping.  You need to use your medicines more often than usual.  You have trouble doing your normal activities such as getting dressed or walking around  the house. Get help right away if:  You have shortness of breath while resting.  You have shortness of breath that stops you from: ? Being able to talk. ? Doing normal activities.  Your chest hurts for longer than 5 minutes.  Your skin color is more blue than usual.  Your pulse oximeter shows that you have low oxygen for longer than 5 minutes.  You have a fever.  You feel too tired to breathe normally. These symptoms may represent a serious problem that is an emergency. Do not wait to see if the symptoms will go away. Get medical help right away. Call your local emergency services (911 in the [place] Do not drive yourself to the hospital. Summary  Chronic obstructive pulmonary disease (COPD) is a long-term lung problem.  The way your lungs work will never return to normal. Usually the condition gets worse over time. There are things you can do to keep yourself as healthy as possible.  Take over-the-counter and prescription medicines only as told by your doctor.  If you smoke, stop. Smoking makes the problem worse. This information is not intended to replace advice given to you by your health care provider. Make sure you discuss any questions you have with your health care provider. Document Revised: 11/25/2019 Document Reviewed: 11/25/2019 Elsevier Patient Education  2021 [employer].

## 2020-07-16 ENCOUNTER — Telehealth: Payer: Medicare Other

## 2020-07-23 ENCOUNTER — Other Ambulatory Visit: Payer: Self-pay

## 2020-07-23 ENCOUNTER — Ambulatory Visit (INDEPENDENT_AMBULATORY_CARE_PROVIDER_SITE_OTHER): Payer: Medicare Other

## 2020-07-23 DIAGNOSIS — J449 Chronic obstructive pulmonary disease, unspecified: Secondary | ICD-10-CM

## 2020-07-23 DIAGNOSIS — F17200 Nicotine dependence, unspecified, uncomplicated: Secondary | ICD-10-CM

## 2020-07-23 DIAGNOSIS — F411 Generalized anxiety disorder: Secondary | ICD-10-CM

## 2020-07-23 NOTE — Patient Instructions (Signed)
Visit Information  PATIENT GOALS:  Goals Addressed             This Visit's Progress    Track and Manage My Symptoms-COPD   Not on track    Timeframe:  Long-Range Goal Priority:  High Start Date:   06/18/2020                          Expected End Date:  09/18/2020                    Follow Up Date 07/19/2020   - begin a symptom diary - eliminate symptom triggers at home - follow rescue plan if symptoms flare-up - keep follow-up appointments  - Use maintenance inhaler daily as prescribed   Why is this important?   Tracking your symptoms and other information about your health helps your doctor plan your care.  Write down the symptoms, the time of day, what you were doing and what medicine you are taking.  You will soon learn how to manage your symptoms.     Notes: Patient to reach out should he have any questions / issues with medications         Patient verbalizes understanding of instructions provided today and agrees to view in [place].   Telephone follow up appointment with care management team member scheduled for: 1 month The patient has been provided with contact information for the care management team and has been advised to call with any health related questions or concerns.   Tomasa Blase, PharmD Clinical Pharmacist, [place]

## 2020-07-23 NOTE — Progress Notes (Addendum)
Chronic Care Management Pharmacy Note  07/23/2020 Name:  Joseph Preston MRN:  [id number] DOB:  1950-02-13  Summary: - Patient has not yet started incruse ellipta for COPD, reports to using trelegy about 3 times weekly when he feels his breathing is getting worse (notes to effectiveness when used) - Notes to more anxiety lately which has been causing elevations in his blood pressures at times - typically averaging ~130/80, when it spikes can increase to ~160/90.   - reported chest pain/ arm pain during last increase in anxiety, reported ~2 weeks ago, denies any issues at this time   Recommendations/Changes made from today's visit: - Recommending for patient to start Incruse ellipta 62.37mcg - 1 puff daily in place of trelegy - Recommending for patient to start recording blood pressures at least once daily to determine if further intervention is required to control blood pressure - Advised for patient to go to ER should he have any issues with chest pain / tightness in the future  Subjective: Joseph Preston is an 70 y.o. year old male who is a primary patient of Plotnikov, Evie Lacks, MD.  The CCM team was consulted for assistance with disease management and care coordination needs.    Engaged with patient by telephone for follow up visit in response to provider referral for pharmacy case management and/or care coordination services.   Consent to Services:  The patient was given the following information about Chronic Care Management services today, agreed to services, and gave verbal consent: 1. CCM service includes personalized support from designated clinical staff supervised by the primary care provider, including individualized plan of care and coordination with other care providers 2. 24/7 contact phone numbers for assistance for urgent and routine care needs. 3. Service will only be billed when office clinical staff spend 20 minutes or more in a month to coordinate care. 4. Only one  practitioner may furnish and bill the service in a calendar month. 5.The patient may stop CCM services at any time (effective at the end of the month) by phone call to the office staff. 6. The patient will be responsible for cost sharing (co-pay) of up to 20% of the service fee (after annual deductible is met). Patient agreed to services and consent obtained.  Patient Care Team: Plotnikov, Evie Lacks, MD as PCP - General (Internal Medicine) Kristin Lamagna, Darnelle Maffucci, [facility] (Pharmacist) Tomasa Blase, [facility] as Pharmacist (Pharmacist)  Recent office visits: No changes since last visit   Recent consult visits: No changes since last visit   Hospital visits: 01/31/2020 - ED visit for evaluation of foreign body in eye - prescribed erythromycin ointment  Objective:  Lab Results  Component Value Date   CREATININE 0.91 05/31/2020   BUN 16 05/31/2020   GFR 86.24 05/31/2020   NA 139 05/31/2020   K 4.0 05/31/2020   CALCIUM 10.3 05/31/2020   CO2 28 05/31/2020   GLUCOSE 83 05/31/2020    Lab Results  Component Value Date/Time   HGBA1C 5.7 03/10/2016 02:00 PM   GFR 86.24 05/31/2020 12:08 PM   GFR 60.90 09/25/2018 10:01 AM    Last diabetic Eye exam:  No results found for: HMDIABEYEEXA  Last diabetic Foot exam:  No results found for: HMDIABFOOTEX   Lab Results  Component Value Date   CHOL 213 (H) 09/25/2018   HDL 82.40 09/25/2018   LDLCALC 104 (H) 09/25/2018   TRIG 135.0 09/25/2018   CHOLHDL 3 09/25/2018    Hepatic Function Latest Ref Rng &  Units 05/31/2020 09/25/2018 04/03/2018  Total Protein 6.0 - 8.3 g/dL 6.2 6.9 7.3  Albumin 3.5 - 5.2 g/dL 4.1 4.7 4.6  AST 0 - 37 U/L $Remo'22 20 22  'iHCbB$ ALT 0 - 53 U/L $Remo'23 16 20  'SYWIK$ Alk Phosphatase 39 - 117 U/L 70 69 69  Total Bilirubin 0.2 - 1.2 mg/dL 0.9 1.1 0.9  Bilirubin, Direct 0.0 - 0.3 mg/dL - 0.2 0.2    Lab Results  Component Value Date/Time   TSH 1.27 05/31/2020 12:08 PM   TSH 1.37 09/25/2018 10:01 AM    CBC Latest Ref Rng & Units 05/31/2020 09/25/2018  04/03/2018  WBC 4.0 - 10.5 K/uL 8.5 6.0 5.5  Hemoglobin 13.0 - 17.0 g/dL 15.1 13.7 14.3  Hematocrit 39.0 - 52.0 % 44.9 41.6 42.8  Platelets 150.0 - 400.0 K/uL 226.0 217.0 249.0    Lab Results  Component Value Date/Time   VD25OH 31 06/02/2013 03:02 PM    Clinical ASCVD: No  The 10-year ASCVD risk score Mikey Bussing DC Jr., et al., 2013) is: 17.7%   Values used to calculate the score:     Age: 76 years     Sex: Male     Is Non-Hispanic African American: No     Diabetic: No     Tobacco smoker: Yes     Systolic Blood Pressure: 354 mmHg     Is BP treated: No     HDL Cholesterol: 82.4 mg/dL     Total Cholesterol: 213 mg/dL    Depression screen [facility] 2/9 06/18/2020 10/10/2016  Decreased Interest 0 1  Down, Depressed, Hopeless 1 2  PHQ - 2 Score 1 3  Altered sleeping 1 2  Tired, decreased energy 0 2  Change in appetite 1 1  Feeling bad or failure about yourself  0 2  Trouble concentrating 2 3  Moving slowly or fidgety/restless 0 0  Suicidal thoughts 0 0  PHQ-9 Score 5 13   CAT score - completed 06/18/2020 - 13   Social History   Tobacco Use  Smoking Status Every Day   Packs/day: 1.00   Years: 19.00   Pack years: 19.00   Types: Cigarettes  Smokeless Tobacco Former  Tobacco Comments   working towards being ready to quit   BP Readings from Last 3 Encounters:  05/31/20 130/72  01/31/20 122/80  12/02/19 (!) 142/78   Pulse Readings from Last 3 Encounters:  05/31/20 74  01/31/20 78  12/02/19 66   Wt Readings from Last 3 Encounters:  05/31/20 145 lb 3.2 oz (65.9 kg)  01/31/20 148 lb (67.1 kg)  12/02/19 149 lb 6.4 oz (67.8 kg)   BMI Readings from Last 3 Encounters:  05/31/20 22.74 kg/m  01/31/20 23.18 kg/m  12/02/19 23.40 kg/m    Assessment/Interventions: Review of patient past medical history, allergies, medications, health status, including review of consultants reports, laboratory and other test data, was performed as part of comprehensive evaluation and provision of  chronic care management services.   SDOH:  (Social Determinants of Health) assessments and interventions performed: Yes   SDOH Screenings   Alcohol Screen: Not on file  Depression (PHQ2-9): Medium Risk   PHQ-2 Score: 5  Financial Resource Strain: Medium Risk   Difficulty of Paying Living Expenses: Somewhat hard  Food Insecurity: Not on file  Housing: Not on file  Physical Activity: Not on file  Social Connections: Not on file  Stress: Not on file  Tobacco Use: High Risk   Smoking Tobacco Use: Every Day  Smokeless Tobacco Use: Former  Transport planner Needs: Not on file    [place]  Allergies  Allergen Reactions   Penicillins Itching and Swelling   Sulfonamide Derivatives Itching and Swelling   Varenicline Tartrate Other (See Comments)    states that he had nightmares and felt crazy    Tramadol Other (See Comments)    dizzy    Medications Reviewed Today     Reviewed by Tomasa Blase, [facility] (Pharmacist) on 06/18/20 at 1407  Med List Status: <None>   Medication Order Taking? Sig Documenting Provider Last Dose Status Informant  albuterol (PROAIR HFA) 108 (90 Base) MCG/ACT inhaler [id number]  Inhale 2 puffs into the lungs every 4 (four) hours as needed for wheezing or shortness of breath. Plotnikov, Evie Lacks, MD  Expired 03/20/20 2359            Med Note (Josselyn Harkins C   Fri Jun 18, 2020  2:03 PM) Only having to use a few times / week  ALPRAZolam (XANAX) 0.5 MG tablet [id number] Yes Take 1 tablet (0.5 mg total) by mouth 2 (two) times daily as needed. for anxiety Plotnikov, Evie Lacks, MD Taking Active            Med Note Delice Bison, Darnelle Maffucci   Fri Jun 18, 2020  2:03 PM) Only having to use a few times per week    Cholecalciferol (VITAMIN D) 2000 units CAPS [id number] Yes 1 po qd Plotnikov, Evie Lacks, MD Taking Active   cyclobenzaprine (FLEXERIL) 5 MG tablet [id number] Yes TAKE 1 TABLET BY MOUTH TWICE DAILY AS NEEDED FOR MUSCLE SPASM  Patient taking differently: Take 5 mg  by mouth. TAKE 1 TABLET BY MOUTH TWICE DAILY AS NEEDED FOR MUSCLE SPASM   Plotnikov, Evie Lacks, MD Taking Active            Med Note Delice Bison, Darnelle Maffucci   Fri Jun 18, 2020  2:04 PM) Usually only taking at bedtime, at times will take an additional time in the day    fexofenadine (ALLEGRA) 180 MG tablet [id number] Yes Take 180 mg by mouth daily. [provider] Taking Active            Med Note Delice Bison, Darnelle Maffucci   Fri Jun 18, 2020  2:05 PM) Takes if he does not take the allegraD   fexofenadine-pseudoephedrine (ALLEGRA-D ALLERGY & CONGESTION) 180-240 MG 24 hr tablet [id number] Yes Take 1 tablet by mouth daily as needed. Plotnikov, Evie Lacks, MD Taking Active            Med Note Delice Bison, Darnelle Maffucci   Fri Jun 18, 2020  2:05 PM) Takes 3 times per week  Fluticasone-Umeclidin-Vilant (TRELEGY ELLIPTA) 100-62.5-25 MCG/INH AEPB [id number]  Inhale 1 spray into the lungs daily. Plotnikov, Evie Lacks, MD  Active   hydrOXYzine (ATARAX/VISTARIL) 25 MG tablet [id number] Yes Take 1 tablet (25 mg total) by mouth every 8 (eight) hours as needed for itching. Plotnikov, Evie Lacks, MD Taking Active   hyoscyamine (LEVSIN, ANASPAZ) 0.125 MG tablet [id number] Yes TAKE ONE TABLET BY MOUTH EVERY 6 HOURS AS NEEDED  Patient taking differently: Take 0.125 mg by mouth every 6 (six) hours as needed.   Plotnikov, Evie Lacks, MD Taking Active   nortriptyline (PAMELOR) 10 MG capsule [id number] Yes TAKE 1 TO 2 CAPSULES BY MOUTH ONCE DAILY AT BEDTIME Plotnikov, Evie Lacks, MD Taking Active   polyethylene glycol powder (GLYCOLAX/MIRALAX) powder [id number] No Take 17 g by mouth 2 (two) times daily as  needed.  Patient not taking: Reported on 06/18/2020   Plotnikov, Evie Lacks, MD Not Taking Active   sildenafil (VIAGRA) 100 MG tablet [id number] Yes TAKE 1 TABLET BY MOUTH  DAILY AS NEEDED FOR  ERECTILE DYSFUNCTION Plotnikov, Evie Lacks, MD Taking Active   tamsulosin (FLOMAX) 0.4 MG CAPS capsule [id number] Yes Take 1 capsule (0.4 mg total) by mouth  daily. 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   Acute upper respiratory infection 04/07/2014   Elevated PSA 08/12/2013   Well adult exam 06/02/2013   Mouth sore 06/02/2013   Irritable bowel syndrome (IBS) 06/02/2013   Lumbar radiculopathy, chronic 05/09/2013   Piriformis syndrome of left side 04/17/2013   PREMATURE EJACULATION 03/11/2007   ERECTILE DYSFUNCTION 03/11/2007   Generalized anxiety disorder 09/20/2006   SMOKER 09/20/2006   ALLERGIC RHINITIS 09/20/2006   COPD (chronic obstructive pulmonary disease) ([place]) 09/20/2006   PROSTATITIS, ACUTE 09/20/2006   ANKLE PAIN, RIGHT 09/20/2006   COLONIC POLYPS, HX OF 09/20/2006    Immunization History  Administered Date(s) Administered   Hepatitis B, adult 12/18/2013, 01/28/2014, 06/10/2014   Influenza Whole 02/27/2005   Tdap 01/05/2015    Conditions to be addressed/monitored:  COPD, Anxiety, BPH, Allergic Rhinitis and Chronic pain   Care Plan : CCM Care Plan  Updates made by Tomasa Blase, [place] since 07/23/2020 12:00 AM     Problem: COPD, Depression, Smoking Cessation, Chronic Pain, BPH   Priority: High  Onset Date: 06/18/2020     Long-Range Goal: Disease Management   Start Date: 06/18/2020  Expected End Date: 09/18/2020  This Visit's Progress: Not on track  Recent Progress: On track  Priority: High  Note:    Current Barriers:  Unable to independently monitor  therapeutic efficacy Does not adhere to prescribed medication regimen  Pharmacist Clinical Goal(s):  Patient will verbalize ability to afford treatment regimen achieve adherence to monitoring guidelines and medication adherence to achieve therapeutic efficacy achieve control of COPD as evidenced by less frequent use of rescue inhaler contact provider office for questions/concerns as evidenced notation of same in electronic health record through collaboration with PharmD and provider.   Interventions: 1:1 collaboration with Plotnikov, Evie Lacks, MD regarding development and update of comprehensive plan of care as evidenced by provider attestation and co-signature Inter-disciplinary care team collaboration (see longitudinal plan of care) Comprehensive medication review performed; medication list updated in electronic medical record  COPD (Goal: control symptoms and prevent exacerbations) -Not ideally controlled -Current treatment  -Trelegy Ellipta 100-62.5-25mcg/ act - 1 puff daily - only has been taking prn  -Ventolin HFA - 2 puffs q4h prn  -Medications previously tried: n/a  -Gold Grade: could not determine, spirometry has not yet been completed  -Current COPD Classification:   Could not complete as spirometry has not yet been completed  -MMRC/CAT score: CAT score = 13 -Pulmonary function testing: not yet completed  -Exacerbations requiring treatment in  last 6 months: n/a -Patient denies consistent use of maintenance inhaler -Frequency of rescue inhaler use: 1-2 times per week - effective when used  -Counseled on Proper inhaler technique; Benefits of consistent maintenance inhaler use When to use rescue inhaler Differences between maintenance and rescue inhalers -Patient has not been using trelegy as prescribed as he prefers not to use a corticosteroid daily - although he notes effectiveness when used -Recommended for patient to stop trelegy - instead will step down therapy to Incruse  ellipta 62.23mcg/act - 1 puff daily   Tobacco use (Goal Smoking Cessation) -Not ideally controlled -Previous quit attempts: patient had quit smoking in 1990 - started smoking again in 2001 -Current treatment  N/a -Patient smokes Within 30 minutes of waking -Patient triggers include: stress and drinking coffee -On a scale of 1-10, reports MOTIVATION to quit is 5 -On a scale of 1-10, reports CONFIDENCE in quitting is 5 -Counseled on patch placement, side effects, and option to remove at night if they experience trouble sleeping or bad dreams.  Counseled on park & chew method for NRT gum. Provided contact information for [place] [facility] (1-800-QUIT-NOW) and encouraged patient to reach out to this group for support. -Recommended for patient to set a quit date, reviewed with patient different medications / NRTs that could be used to aid in smoking cessation, patient will continue to work to set smoking cessation date - will call with any issues or concerns   Depression/Anxiety (Goal: prevention of anxiety attacks) -Controlled -Current treatment: Alprazolam 0.5mg  - 1 tablet BID prn  Elavil 10mg  - 1-2 tablets HS  -Medications previously tried/failed: n/a -PHQ9: 5 -GAD7: 5 COMPLETE PHQ9 and GAD& score  -Educated on Benefits of medication for symptom control -Recommended to continue current medication  Chronic Back Pain / spasms (Goal: pain control / prevention of exacerbations) -Controlled -Current treatment  Flexeril 5mg  BID - typically only taking once daily HS   -Medications previously tried: Tramadol, Norco, Hysingla ER, Voltaren, Naproxen   -Recommended to continue current medication  BPH (Goal: Maintain control / prevention progression) -Controlled -Current treatment  Tamsulosin 0.4mg  daily  -Medications previously tried: n/a  -Recommended to continue current medication   Allergic Rhinitis (Goal: Prevention of flares / treatment plan) -Controlled -Current treatment  Hydroxyzine  25mg  - 1 tablet q8h prn itching Allegra D 180-240mg  - 1 tablet daily   - Taking TIW  Allegra 180mg  - 1 tablet daily - takes on days that he does not take allegra D -Medications previously tried: n/a  -Recommended to continue current medication   Health Maintenance -Current therapy:  Viagra 100mg  - 1 tablet daily prn Hyoscyamine 0.125mg  - 1 tablet q6h prn - for acid reflux if needed   Miralax - 17g  2 times daily as needed  Vitamin D 2000 units daily  Magnesium 250mg  - 1 tablet daily - usually takes 1 day weekly (for sleep) Vitamin C 500mg  daily  -Educated on Herbal supplement research is limited and benefits usually cannot be proven Cost vs benefit of each product must be carefully weighed by individual consumer -Patient is satisfied with current therapy and denies issues -Recommended to continue current medication   Patient Goals/Self-Care Activities Patient will:  - take medications as prescribed focus on medication adherence by using maintenance inhaler daily as prescribed for COPD control       Medication Assistance:  none required at this time  Patient's preferred pharmacy is:  [facility] - [place], [place] - [phone]  [address] [place] [address] Phone: [phone] Fax: [phone]   Uses pill box? No Pt endorses 75% compliance  Care Plan and Follow Up Patient Decision:  Patient agrees to Care Plan and Follow-up.  Plan: Telephone follow up appointment with care management team member scheduled for:  1 month  and The patient has been provided with contact information for the care management team and has been advised to call with any health related questions or concerns.   Medical screening examination/treatment/procedure(s) were performed by non-physician practitioner and as supervising physician I was immediately available for consultation/collaboration.  I agree with above. Lew Dawes, MD

## 2020-08-20 ENCOUNTER — Ambulatory Visit: Payer: Medicare Other

## 2020-08-20 ENCOUNTER — Other Ambulatory Visit: Payer: Self-pay

## 2020-08-20 DIAGNOSIS — J449 Chronic obstructive pulmonary disease, unspecified: Secondary | ICD-10-CM

## 2020-08-20 DIAGNOSIS — E785 Hyperlipidemia, unspecified: Secondary | ICD-10-CM

## 2020-08-20 DIAGNOSIS — F411 Generalized anxiety disorder: Secondary | ICD-10-CM

## 2020-08-20 NOTE — Patient Instructions (Signed)
Visit Information  PATIENT GOALS:  Goals Addressed             This Visit's Progress    Track and Manage My Symptoms-COPD   Not on track    Timeframe:  Long-Range Goal Priority:  High Start Date:   06/18/2020                          Expected End Date:  09/18/2020                    Follow Up Date 07/19/2020   - begin a symptom diary - eliminate symptom triggers at home - follow rescue plan if symptoms flare-up - keep follow-up appointments  - Use maintenance inhaler daily as prescribed   Why is this important?   Tracking your symptoms and other information about your health helps your doctor plan your care.  Write down the symptoms, the time of day, what you were doing and what medicine you are taking.  You will soon learn how to manage your symptoms.     Notes: Patient to reach out should he have any questions / issues with medications        Patient verbalizes understanding of instructions provided today and agrees to view in [place].   Telephone follow up appointment with care management team member scheduled for: 1 month The patient has been provided with contact information for the care management team and has been advised to call with any health related questions or concerns.   Tomasa Blase, PharmD Clinical Pharmacist, [place]

## 2020-08-20 NOTE — Progress Notes (Addendum)
Chronic Care Management Pharmacy Note  08/20/2020 Name:  Joseph Preston MRN:  [id number] DOB:  1950/10/20  Summary: - Patient has not yet started incruse ellipta for COPD - Patient reports that blood pressure has been more stable since last visit, averaging <130/80, notes that he has been less stressed since last appointment which has been helping keep BP controlled, at times can elevate but resolves within minutes   - reports only time he feels short of breath recently is at night when he is laying down  Recommendations/Changes made from today's visit: - Recommending for patient to start Incruse ellipta 62. - 1 puff daily in place of trelegy - Recommending for patient to continue recording blood pressures at least once daily to determine if further intervention is required to control blood pressure - Advised for patient to go to ER should he have any issues with chest pain / tightness in the future  Subjective: Joseph Preston is an 70 y.o. year old male who is a primary patient of Plotnikov, Georgina Quint, MD.  The CCM team was consulted for assistance with disease management and care coordination needs.    Engaged with patient by telephone for follow up visit in response to provider referral for pharmacy case management and/or care coordination services.   Consent to Services:  The patient was given the following information about Chronic Care Management services today, agreed to services, and gave verbal consent: 1. CCM service includes personalized support from designated clinical staff supervised by the primary care provider, including individualized plan of care and coordination with other care providers 2. 24/7 contact phone numbers for assistance for urgent and routine care needs. 3. Service will only be billed when office clinical staff spend 20 minutes or more in a month to coordinate care. 4. Only one practitioner may furnish and bill the service in a calendar month. 5.The  patient may stop CCM services at any time (effective at the end of the month) by phone call to the office staff. 6. The patient will be responsible for cost sharing (co-pay) of up to 20% of the service fee (after annual deductible is met). Patient agreed to services and consent obtained.  Patient Care Team: Plotnikov, Georgina Quint, MD as PCP - General (Internal Medicine) Liese Dizdarevic, Vinnie Level, [facility] (Pharmacist) Ellin Saba, [facility] as Pharmacist (Pharmacist)  Recent office visits: No changes since last visit   Recent consult visits: No changes since last visit   Hospital visits: 01/31/2020 - ED visit for evaluation of foreign body in eye - prescribed erythromycin ointment  Objective:  Lab Results  Component Value Date   CREATININE 0.91 05/31/2020   BUN 16 05/31/2020   GFR 86.24 05/31/2020   NA 139 05/31/2020   K 4.0 05/31/2020   CALCIUM 10.3 05/31/2020   CO2 28 05/31/2020   GLUCOSE 83 05/31/2020    Lab Results  Component Value Date/Time   HGBA1C 5.7 03/10/2016 02:00 PM   GFR 86.24 05/31/2020 12:08 PM   GFR 60.90 09/25/2018 10:01 AM    Last diabetic Eye exam:  No results found for: HMDIABEYEEXA  Last diabetic Foot exam:  No results found for: HMDIABFOOTEX   Lab Results  Component Value Date   CHOL 213 (H) 09/25/2018   HDL 82.40 09/25/2018   LDLCALC 104 (H) 09/25/2018   TRIG 135.0 09/25/2018   CHOLHDL 3 09/25/2018    Hepatic Function Latest Ref Rng & Units 05/31/2020 09/25/2018 04/03/2018  Total Protein 6.0 - 8.3 g/dL 6.2 6.9  7.3  Albumin 3.5 - 5.2 g/dL 4.1 4.7 4.6  AST 0 - 37 U/L $Remo'22 20 22  'lZhWw$ ALT 0 - 53 U/L $Remo'23 16 20  'hySTm$ Alk Phosphatase 39 - 117 U/L 70 69 69  Total Bilirubin 0.2 - 1.2 mg/dL 0.9 1.1 0.9  Bilirubin, Direct 0.0 - 0.3 mg/dL - 0.2 0.2    Lab Results  Component Value Date/Time   TSH 1.27 05/31/2020 12:08 PM   TSH 1.37 09/25/2018 10:01 AM    CBC Latest Ref Rng & Units 05/31/2020 09/25/2018 04/03/2018  WBC 4.0 - 10.5 K/uL 8.5 6.0 5.5  Hemoglobin 13.0 - 17.0 g/dL 15.1  13.7 14.3  Hematocrit 39.0 - 52.0 % 44.9 41.6 42.8  Platelets 150.0 - 400.0 K/uL 226.0 217.0 249.0    Lab Results  Component Value Date/Time   VD25OH 31 06/02/2013 03:02 PM    Clinical ASCVD: No  The 10-year ASCVD risk score Mikey Bussing DC Jr., et al., 2013) is: 17.7%   Values used to calculate the score:     Age: 16 years     Sex: Male     Is Non-Hispanic African American: No     Diabetic: No     Tobacco smoker: Yes     Systolic Blood Pressure: 383 mmHg     Is BP treated: No     HDL Cholesterol: 82.4 mg/dL     Total Cholesterol: 213 mg/dL    Depression screen [facility] 2/9 06/18/2020 10/10/2016  Decreased Interest 0 1  Down, Depressed, Hopeless 1 2  PHQ - 2 Score 1 3  Altered sleeping 1 2  Tired, decreased energy 0 2  Change in appetite 1 1  Feeling bad or failure about yourself  0 2  Trouble concentrating 2 3  Moving slowly or fidgety/restless 0 0  Suicidal thoughts 0 0  PHQ-9 Score 5 13   CAT score - completed 06/18/2020 - 13   Social History   Tobacco Use  Smoking Status Every Day   Packs/day: 1.00   Years: 19.00   Pack years: 19.00   Types: Cigarettes  Smokeless Tobacco Former  Tobacco Comments   working towards being ready to quit   BP Readings from Last 3 Encounters:  05/31/20 130/72  01/31/20 122/80  12/02/19 (!) 142/78   Pulse Readings from Last 3 Encounters:  05/31/20 74  01/31/20 78  12/02/19 66   Wt Readings from Last 3 Encounters:  05/31/20 145 lb 3.2 oz (65.9 kg)  01/31/20 148 lb (67.1 kg)  12/02/19 149 lb 6.4 oz (67.8 kg)   BMI Readings from Last 3 Encounters:  05/31/20 22.74 kg/m  01/31/20 23.18 kg/m  12/02/19 23.40 kg/m    Assessment/Interventions: Review of patient past medical history, allergies, medications, health status, including review of consultants reports, laboratory and other test data, was performed as part of comprehensive evaluation and provision of chronic care management services.   SDOH:  (Social Determinants of Health)  assessments and interventions performed: Yes   SDOH Screenings   Alcohol Screen: Not on file  Depression (PHQ2-9): Medium Risk   PHQ-2 Score: 5  Financial Resource Strain: Medium Risk   Difficulty of Paying Living Expenses: Somewhat hard  Food Insecurity: Not on file  Housing: Not on file  Physical Activity: Not on file  Social Connections: Not on file  Stress: Not on file  Tobacco Use: High Risk   Smoking Tobacco Use: Every Day   Smokeless Tobacco Use: Former  Transportation Needs: Not on file  CCM Care Plan  Allergies  Allergen Reactions   Penicillins Itching and Swelling   Sulfonamide Derivatives Itching and Swelling   Varenicline Tartrate Other (See Comments)    states that he had nightmares and felt crazy    Tramadol Other (See Comments)    dizzy    Medications Reviewed Today     Reviewed by Tomasa Blase, [facility] (Pharmacist) on 06/18/20 at 1407  Med List Status: <None>   Medication Order Taking? Sig Documenting Provider Last Dose Status Informant  albuterol (PROAIR HFA) 108 (90 Base) MCG/ACT inhaler [id number]  Inhale 2 puffs into the lungs every 4 (four) hours as needed for wheezing or shortness of breath. Plotnikov, Evie Lacks, MD  Expired 03/20/20 2359            Med Note (Brailey Buescher C   Fri Jun 18, 2020  2:03 PM) Only having to use a few times / week  ALPRAZolam (XANAX) 0.5 MG tablet [id number] Yes Take 1 tablet (0.5 mg total) by mouth 2 (two) times daily as needed. for anxiety Plotnikov, Evie Lacks, MD Taking Active            Med Note Delice Bison, Darnelle Maffucci   Fri Jun 18, 2020  2:03 PM) Only having to use a few times per week    Cholecalciferol (VITAMIN D) 2000 units CAPS [id number] Yes 1 po qd Plotnikov, Evie Lacks, MD Taking Active   cyclobenzaprine (FLEXERIL) 5 MG tablet [id number] Yes TAKE 1 TABLET BY MOUTH TWICE DAILY AS NEEDED FOR MUSCLE SPASM  Patient taking differently: Take 5 mg by mouth. TAKE 1 TABLET BY MOUTH TWICE DAILY AS NEEDED FOR MUSCLE SPASM    Plotnikov, Evie Lacks, MD Taking Active            Med Note Delice Bison, Darnelle Maffucci   Fri Jun 18, 2020  2:04 PM) Usually only taking at bedtime, at times will take an additional time in the day    fexofenadine (ALLEGRA) 180 MG tablet [id number] Yes Take 180 mg by mouth daily. [provider] Taking Active            Med Note Delice Bison, Darnelle Maffucci   Fri Jun 18, 2020  2:05 PM) Takes if he does not take the allegraD   fexofenadine-pseudoephedrine (ALLEGRA-D ALLERGY & CONGESTION) 180-240 MG 24 hr tablet [id number] Yes Take 1 tablet by mouth daily as needed. Plotnikov, Evie Lacks, MD Taking Active            Med Note Delice Bison, Darnelle Maffucci   Fri Jun 18, 2020  2:05 PM) Takes 3 times per week  Fluticasone-Umeclidin-Vilant (TRELEGY ELLIPTA) 100-62.5-25 MCG/INH AEPB [id number]  Inhale 1 spray into the lungs daily. Plotnikov, Evie Lacks, MD  Active   hydrOXYzine (ATARAX/VISTARIL) 25 MG tablet [id number] Yes Take 1 tablet (25 mg total) by mouth every 8 (eight) hours as needed for itching. Plotnikov, Evie Lacks, MD Taking Active   hyoscyamine (LEVSIN, ANASPAZ) 0.125 MG tablet [id number] Yes TAKE ONE TABLET BY MOUTH EVERY 6 HOURS AS NEEDED  Patient taking differently: Take 0.125 mg by mouth every 6 (six) hours as needed.   Plotnikov, Evie Lacks, MD Taking Active   nortriptyline (PAMELOR) 10 MG capsule [id number] Yes TAKE 1 TO 2 CAPSULES BY MOUTH ONCE DAILY AT BEDTIME Plotnikov, Evie Lacks, MD Taking Active   polyethylene glycol powder (GLYCOLAX/MIRALAX) powder [id number] No Take 17 g by mouth 2 (two) times daily as needed.  Patient not taking: Reported on 06/18/2020   Plotnikov, Evie Lacks,  MD Not Taking Active   sildenafil (VIAGRA) 100 MG tablet [id number] Yes TAKE 1 TABLET BY MOUTH  DAILY AS NEEDED FOR  ERECTILE DYSFUNCTION Plotnikov, Evie Lacks, MD Taking Active   tamsulosin (FLOMAX) 0.4 MG CAPS capsule [id number] Yes Take 1 capsule (0.4 mg total) by mouth daily. 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   Acute upper respiratory infection 04/07/2014   Elevated PSA 08/12/2013   Well adult exam 06/02/2013   Mouth sore 06/02/2013   Irritable bowel syndrome (IBS) 06/02/2013   Lumbar radiculopathy, chronic 05/09/2013   Piriformis syndrome of left side 04/17/2013   PREMATURE EJACULATION 03/11/2007   ERECTILE DYSFUNCTION 03/11/2007   Generalized anxiety disorder 09/20/2006   SMOKER 09/20/2006   ALLERGIC RHINITIS 09/20/2006   COPD (chronic obstructive pulmonary disease) ([place]) 09/20/2006   PROSTATITIS, ACUTE 09/20/2006   ANKLE PAIN, RIGHT 09/20/2006   COLONIC POLYPS, HX OF 09/20/2006    Immunization History  Administered Date(s) Administered   Hepatitis B, adult 12/18/2013, 01/28/2014, 06/10/2014   Influenza Whole 02/27/2005   Tdap 01/05/2015    Conditions to be addressed/monitored:  COPD, Anxiety, BPH, Allergic Rhinitis and Chronic pain   Care Plan : CCM Care Plan  Updates made by Tomasa Blase, RPH since 08/20/2020 12:00 AM     Problem: COPD, Depression, Smoking Cessation, Chronic Pain, BPH   Priority: High  Onset Date: 06/18/2020     Long-Range Goal: Disease Management   Start Date: 06/18/2020  Expected End Date: 09/18/2020  This Visit's Progress: Not on track  Recent Progress: Not on track  Priority: High  Note:    Current Barriers:  Unable to independently monitor therapeutic efficacy Does not adhere to prescribed medication  regimen  Pharmacist Clinical Goal(s):  Patient will verbalize ability to afford treatment regimen achieve adherence to monitoring guidelines and medication adherence to achieve therapeutic efficacy achieve control of COPD as evidenced by less frequent use of rescue inhaler contact provider office for questions/concerns as evidenced notation of same in electronic health record through collaboration with PharmD and provider.   Interventions: 1:1 collaboration with Plotnikov, Evie Lacks, MD regarding development and update of comprehensive plan of care as evidenced by provider attestation and co-signature Inter-disciplinary care team collaboration (see longitudinal plan of care) Comprehensive medication review performed; medication list updated in electronic medical record  COPD (Goal: control symptoms and prevent exacerbations) -Not ideally controlled -Current treatment  -Trelegy Ellipta 100-62.5-25mcg/ act - 1 puff daily - only has been taking prn  -Ventolin HFA - 2 puffs q4h prn  -Medications previously tried: n/a  -Gold Grade: could not determine, spirometry has not yet been completed  -Current COPD Classification:   Could not complete as spirometry has not yet been completed  -MMRC/CAT score: CAT score = 13 -Pulmonary function testing: not yet completed  -Exacerbations requiring treatment in last 6 months: n/a -Patient denies consistent use of maintenance inhaler -Frequency  of rescue inhaler use: 1-2 times per week - effective when used  -Counseled on Proper inhaler technique; Benefits of consistent maintenance inhaler use When to use rescue inhaler Differences between maintenance and rescue inhalers -Patient has not been using trelegy as prescribed as he prefers not to use a corticosteroid daily - although he notes effectiveness when used -Recommended for patient to stop trelegy - instead will step down therapy to Incruse ellipta 62.49mcg/act - 1 puff daily   Tobacco use (Goal  Smoking Cessation) -Not ideally controlled -Previous quit attempts: patient had quit smoking in 1990 - started smoking again in 2001 -Current treatment  N/a -Patient smokes Within 30 minutes of waking -Patient triggers include: stress and drinking coffee -On a scale of 1-10, reports MOTIVATION to quit is 5 -On a scale of 1-10, reports CONFIDENCE in quitting is 5 -Counseled on patch placement, side effects, and option to remove at night if they experience trouble sleeping or bad dreams.  Counseled on park & chew method for NRT gum. Provided contact information for [place] [facility]uit [facility] (1-800-QUIT-NOW) and encouraged patient to reach out to this group for support. -Recommended for patient to set a quit date, reviewed with patient different medications / NRTs that could be used to aid in smoking cessation, patient will continue to work to set smoking cessation date - will call with any issues or concerns   Depression/Anxiety (Goal: prevention of anxiety attacks) -Controlled -Current treatment: Alprazolam 0.5mg  - 1 tablet BID prn  Elavil 10mg  - 1-2 tablets HS  -Medications previously tried/failed: n/a -PHQ9: 5 -GAD7: 5 COMPLETE PHQ9 and GAD& score  -Educated on Benefits of medication for symptom control -Recommended to continue current medication  Chronic Back Pain / spasms (Goal: pain control / prevention of exacerbations) -Controlled -Current treatment  Flexeril 5mg  BID - typically only taking once daily HS   -Medications previously tried: Tramadol, Norco, Hysingla ER, Voltaren, Naproxen   -Recommended to continue current medication  BPH (Goal: Maintain control / prevention progression) -Controlled -Current treatment  Tamsulosin 0.4mg  daily  -Medications previously tried: n/a  -Recommended to continue current medication   Allergic Rhinitis (Goal: Prevention of flares / treatment plan) -Controlled -Current treatment  Hydroxyzine 25mg  - 1 tablet q8h prn itching Allegra D 180-240mg  - 1  tablet daily   - Taking TIW  Allegra 180mg  - 1 tablet daily - takes on days that he does not take allegra D -Medications previously tried: n/a  -Recommended to continue current medication   Health Maintenance -Current therapy:  Viagra 100mg  - 1 tablet daily prn Hyoscyamine 0.125mg  - 1 tablet q6h prn - for acid reflux if needed   Miralax - 17g  2 times daily as needed  Vitamin D 2000 units daily  Magnesium 250mg  - 1 tablet daily - usually takes 1 day weekly (for sleep) Vitamin C 500mg  daily  -Educated on Herbal supplement research is limited and benefits usually cannot be proven Cost vs benefit of each product must be carefully weighed by individual consumer -Patient is satisfied with current therapy and denies issues -Recommended to continue current medication   Patient Goals/Self-Care Activities Patient will:  - take medications as prescribed focus on medication adherence by using maintenance inhaler daily as prescribed for COPD control        Medication Assistance:  none required at this time  Patient's preferred pharmacy is:  [facility]  ([place]) - [address], [address] [place]  [place] [address] Phone: [phone] Fax: [phone]   Uses pill box? No Pt endorses 75% compliance  Care Plan and Follow Up Patient Decision:  Patient agrees to Care Plan and Follow-up.  Plan: Telephone follow up appointment with care management team member scheduled for:  1 month  and The patient has been provided with contact information for the care management team and has been advised to call with any health related questions or concerns.    Tomasa Blase, PharmD Clinical Pharmacist, [place] screening examination/treatment/procedure(s) were performed by non-physician practitioner and as supervising physician I was immediately available for consultation/collaboration.  I agree with above. Lew Dawes, MD

## 2020-08-24 ENCOUNTER — Other Ambulatory Visit: Payer: Self-pay | Admitting: Internal Medicine

## 2020-08-24 MED ORDER — INCRUSE ELLIPTA 62.5 MCG/INH IN AEPB: 1.0000 | INHALATION_SPRAY | Freq: Every day | RESPIRATORY_TRACT | 3 refills | Status: AC

## 2020-09-24 ENCOUNTER — Other Ambulatory Visit: Payer: Self-pay

## 2020-09-24 ENCOUNTER — Ambulatory Visit: Payer: Medicare Other

## 2020-09-24 DIAGNOSIS — J449 Chronic obstructive pulmonary disease, unspecified: Secondary | ICD-10-CM

## 2020-09-24 DIAGNOSIS — E785 Hyperlipidemia, unspecified: Secondary | ICD-10-CM

## 2020-09-24 NOTE — Patient Instructions (Signed)
Visit Information  PATIENT GOALS:  Goals Addressed             This Visit's Progress    Track and Manage My Symptoms-COPD   On track    Timeframe:  Long-Range Goal Priority:  High Start Date:   06/18/2020                          Expected End Date:  09/18/2020                    Follow Up Date 07/19/2020   - begin a symptom diary - eliminate symptom triggers at home - follow rescue plan if symptoms flare-up - keep follow-up appointments  - Use maintenance inhaler daily as prescribed   Why is this important?   Tracking your symptoms and other information about your health helps your doctor plan your care.  Write down the symptoms, the time of day, what you were doing and what medicine you are taking.  You will soon learn how to manage your symptoms.     Notes: Patient to reach out should he have any questions / issues with medications        Patient verbalizes understanding of instructions provided today and agrees to view in [place].   Telephone follow up appointment with care management team member scheduled for: 4 months The patient has been provided with contact information for the care management team and has been advised to call with any health related questions or concerns.   Tomasa Blase, PharmD Clinical Pharmacist, [place]

## 2020-09-24 NOTE — Progress Notes (Addendum)
Chronic Care Management Pharmacy Note  09/24/2020 Name:  Joseph Preston MRN:  [id number] DOB:  Aug 29, 1950  Summary: -Patient reports that he has started incruse ellipta about 1 week ago, feels that inhaler is helpful and is keeping breathing under control.  Has not had to use rescue inhaler in at least a month -reports to post nasal drip that he has a night that he is continuing to take allegra/ allegra d to help alleviate, otherwise no issues or concerns   Recommendations/Changes made from today's visit: -Recommending no changes to medications at this time, patient is doing well with new inhaler, reports that medication is covered by insurance, patient to reach out with any issues or concerns prior to next appointment   Subjective: Joseph Preston is an 70 y.o. year old male who is a primary patient of Plotnikov, Evie Lacks, MD.  The CCM team was consulted for assistance with disease management and care coordination needs.    Engaged with patient by telephone for follow up visit in response to provider referral for pharmacy case management and/or care coordination services.   Consent to Services:  The patient was given the following information about Chronic Care Management services today, agreed to services, and gave verbal consent: 1. CCM service includes personalized support from designated clinical staff supervised by the primary care provider, including individualized plan of care and coordination with other care providers 2. 24/7 contact phone numbers for assistance for urgent and routine care needs. 3. Service will only be billed when office clinical staff spend 20 minutes or more in a month to coordinate care. 4. Only one practitioner may furnish and bill the service in a calendar month. 5.The patient may stop CCM services at any time (effective at the end of the month) by phone call to the office staff. 6. The patient will be responsible for cost sharing (co-pay) of up to 20% of the  service fee (after annual deductible is met). Patient agreed to services and consent obtained.  Patient Care Team: Plotnikov, Evie Lacks, MD as PCP - General (Internal Medicine) Angala Hilgers, Darnelle Maffucci, [facility] (Pharmacist) Tomasa Blase, [facility] as Pharmacist (Pharmacist)  Recent office visits: No changes since last visit   Recent consult visits: No changes since last visit   Hospital visits: None in the past 6 months  Objective:  Lab Results  Component Value Date   CREATININE 0.91 05/31/2020   BUN 16 05/31/2020   GFR 86.24 05/31/2020   NA 139 05/31/2020   K 4.0 05/31/2020   CALCIUM 10.3 05/31/2020   CO2 28 05/31/2020   GLUCOSE 83 05/31/2020    Lab Results  Component Value Date/Time   HGBA1C 5.7 03/10/2016 02:00 PM   GFR 86.24 05/31/2020 12:08 PM   GFR 60.90 09/25/2018 10:01 AM    Last diabetic Eye exam:  No results found for: HMDIABEYEEXA  Last diabetic Foot exam:  No results found for: HMDIABFOOTEX   Lab Results  Component Value Date   CHOL 213 (H) 09/25/2018   HDL 82.40 09/25/2018   LDLCALC 104 (H) 09/25/2018   TRIG 135.0 09/25/2018   CHOLHDL 3 09/25/2018    Hepatic Function Latest Ref Rng & Units 05/31/2020 09/25/2018 04/03/2018  Total Protein 6.0 - 8.3 g/dL 6.2 6.9 7.3  Albumin 3.5 - 5.2 g/dL 4.1 4.7 4.6  AST 0 - 37 U/L _0 ALT 0 - 53 U/L _1 Alk Phosphatase 39 - 117 U/L 70 69 69  Total Bilirubin  0.2 - 1.2 mg/dL 0.9 1.1 0.9  Bilirubin, Direct 0.0 - 0.3 mg/dL - 0.2 0.2    Lab Results  Component Value Date/Time   TSH 1.27 05/31/2020 12:08 PM   TSH 1.37 09/25/2018 10:01 AM    CBC Latest Ref Rng & Units 05/31/2020 09/25/2018 04/03/2018  WBC 4.0 - 10.5 K/uL 8.5 6.0 5.5  Hemoglobin 13.0 - 17.0 g/dL 15.1 13.7 14.3  Hematocrit 39.0 - 52.0 % 44.9 41.6 42.8  Platelets 150.0 - 400.0 K/uL 226.0 217.0 249.0    Lab Results  Component Value Date/Time   VD25OH 31 06/02/2013 03:02 PM    Clinical ASCVD: No  The 10-year ASCVD risk score Mikey Bussing DC Jr., et al.,  2013) is: 17.7%   Values used to calculate the score:     Age: 54 years     Sex: Male     Is Non-Hispanic African American: No     Diabetic: No     Tobacco smoker: Yes     Systolic Blood Pressure: 409 mmHg     Is BP treated: No     HDL Cholesterol: 82.4 mg/dL     Total Cholesterol: 213 mg/dL    Depression screen [facility] 2/9 06/18/2020 10/10/2016  Decreased Interest 0 1  Down, Depressed, Hopeless 1 2  PHQ - 2 Score 1 3  Altered sleeping 1 2  Tired, decreased energy 0 2  Change in appetite 1 1  Feeling bad or failure about yourself  0 2  Trouble concentrating 2 3  Moving slowly or fidgety/restless 0 0  Suicidal thoughts 0 0  PHQ-9 Score 5 13   CAT score - completed 06/18/2020 - 13   Social History   Tobacco Use  Smoking Status Every Day   Packs/day: 1.00   Years: 19.00   Pack years: 19.00   Types: Cigarettes  Smokeless Tobacco Former  Tobacco Comments   working towards being ready to quit   BP Readings from Last 3 Encounters:  05/31/20 130/72  01/31/20 122/80  12/02/19 (!) 142/78   Pulse Readings from Last 3 Encounters:  05/31/20 74  01/31/20 78  12/02/19 66   Wt Readings from Last 3 Encounters:  05/31/20 145 lb 3.2 oz (65.9 kg)  01/31/20 148 lb (67.1 kg)  12/02/19 149 lb 6.4 oz (67.8 kg)   BMI Readings from Last 3 Encounters:  05/31/20 22.74 kg/m  01/31/20 23.18 kg/m  12/02/19 23.40 kg/m    Assessment/Interventions: Review of patient past medical history, allergies, medications, health status, including review of consultants reports, laboratory and other test data, was performed as part of comprehensive evaluation and provision of chronic care management services.   SDOH:  (Social Determinants of Health) assessments and interventions performed: Yes   SDOH Screenings   Alcohol Screen: Not on file  Depression (PHQ2-9): Medium Risk   PHQ-2 Score: 5  Financial Resource Strain: Medium Risk   Difficulty of Paying Living Expenses: Somewhat hard  Food  Insecurity: Not on file  Housing: Not on file  Physical Activity: Not on file  Social Connections: Not on file  Stress: Not on file  Tobacco Use: High Risk   Smoking Tobacco Use: Every Day   Smokeless Tobacco Use: Former  Transport planner Needs: Not on file    CCM Care Plan  Allergies  Allergen Reactions   Penicillins Itching and Swelling   Sulfonamide Derivatives Itching and Swelling   Varenicline Tartrate Other (See Comments)    states that he had nightmares and felt crazy  Tramadol Other (See Comments)    dizzy    Medications Reviewed Today     Reviewed by Tomasa Blase, [facility] (Pharmacist) on 06/18/20 at 1407  Med List Status: <None>   Medication Order Taking? Sig Documenting Provider Last Dose Status Informant  albuterol (PROAIR HFA) 108 (90 Base) MCG/ACT inhaler [id number]  Inhale 2 puffs into the lungs every 4 (four) hours as needed for wheezing or shortness of breath. Plotnikov, Evie Lacks, MD  Expired 03/20/20 2359            Med Note (Rickeya Manus C   Fri Jun 18, 2020  2:03 PM) Only having to use a few times / week  ALPRAZolam (XANAX) 0.5 MG tablet [id number] Yes Take 1 tablet (0.5 mg total) by mouth 2 (two) times daily as needed. for anxiety Plotnikov, Evie Lacks, MD Taking Active            Med Note Delice Bison, Darnelle Maffucci   Fri Jun 18, 2020  2:03 PM) Only having to use a few times per week    Cholecalciferol (VITAMIN D) 2000 units CAPS [id number] Yes 1 po qd Plotnikov, Evie Lacks, MD Taking Active   cyclobenzaprine (FLEXERIL) 5 MG tablet [id number] Yes TAKE 1 TABLET BY MOUTH TWICE DAILY AS NEEDED FOR MUSCLE SPASM  Patient taking differently: Take 5 mg by mouth. TAKE 1 TABLET BY MOUTH TWICE DAILY AS NEEDED FOR MUSCLE SPASM   Plotnikov, Evie Lacks, MD Taking Active            Med Note Delice Bison, Darnelle Maffucci   Fri Jun 18, 2020  2:04 PM) Usually only taking at bedtime, at times will take an additional time in the day    fexofenadine (ALLEGRA) 180 MG tablet [id number] Yes Take 180 mg  by mouth daily. [provider] Taking Active            Med Note Delice Bison, Darnelle Maffucci   Fri Jun 18, 2020  2:05 PM) Takes if he does not take the allegraD   fexofenadine-pseudoephedrine (ALLEGRA-D ALLERGY & CONGESTION) 180-240 MG 24 hr tablet [id number] Yes Take 1 tablet by mouth daily as needed. Plotnikov, Evie Lacks, MD Taking Active            Med Note Delice Bison, Darnelle Maffucci   Fri Jun 18, 2020  2:05 PM) Takes 3 times per week  Fluticasone-Umeclidin-Vilant (TRELEGY ELLIPTA) 100-62.5-25 MCG/INH AEPB [id number]  Inhale 1 spray into the lungs daily. Plotnikov, Evie Lacks, MD  Active   hydrOXYzine (ATARAX/VISTARIL) 25 MG tablet [id number] Yes Take 1 tablet (25 mg total) by mouth every 8 (eight) hours as needed for itching. Plotnikov, Evie Lacks, MD Taking Active   hyoscyamine (LEVSIN, ANASPAZ) 0.125 MG tablet [id number] Yes TAKE ONE TABLET BY MOUTH EVERY 6 HOURS AS NEEDED  Patient taking differently: Take 0.125 mg by mouth every 6 (six) hours as needed.   Plotnikov, Evie Lacks, MD Taking Active   nortriptyline (PAMELOR) 10 MG capsule [id number] Yes TAKE 1 TO 2 CAPSULES BY MOUTH ONCE DAILY AT BEDTIME Plotnikov, Evie Lacks, MD Taking Active   polyethylene glycol powder (GLYCOLAX/MIRALAX) powder [id number] No Take 17 g by mouth 2 (two) times daily as needed.  Patient not taking: Reported on 06/18/2020   Plotnikov, Evie Lacks, MD Not Taking Active   sildenafil (VIAGRA) 100 MG tablet [id number] Yes TAKE 1 TABLET BY MOUTH  DAILY AS NEEDED FOR  ERECTILE DYSFUNCTION Plotnikov, Evie Lacks, MD Taking Active   tamsulosin (FLOMAX) 0.4 MG CAPS capsule [id number] Yes  Take 1 capsule (0.4 mg total) by mouth daily. 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   Acute upper respiratory infection 04/07/2014   Elevated PSA 08/12/2013   Well adult exam 06/02/2013   Mouth sore 06/02/2013   Irritable bowel syndrome (IBS) 06/02/2013   Lumbar radiculopathy, chronic 05/09/2013   Piriformis syndrome of left side 04/17/2013   PREMATURE EJACULATION 03/11/2007   ERECTILE DYSFUNCTION 03/11/2007   Generalized anxiety disorder 09/20/2006   SMOKER 09/20/2006   ALLERGIC RHINITIS 09/20/2006   COPD (chronic obstructive pulmonary disease) ([place]) 09/20/2006   PROSTATITIS, ACUTE 09/20/2006   ANKLE PAIN, RIGHT 09/20/2006   COLONIC POLYPS, HX OF 09/20/2006    Immunization History  Administered Date(s) Administered   Hepatitis B, adult 12/18/2013, 01/28/2014, 06/10/2014   Influenza Whole 02/27/2005   Tdap 01/05/2015    Conditions to be addressed/monitored:  COPD, Anxiety, BPH, Allergic Rhinitis and Chronic pain   Care Plan : CCM Care Plan  Updates made by Tomasa Blase, RPH since 09/24/2020 12:00 AM     Problem: COPD, Depression, Smoking Cessation, Chronic Pain, BPH   Priority: High  Onset Date: 06/18/2020     Long-Range Goal: Disease Management   Start Date: 06/18/2020  Expected End Date: 03/21/2021  This Visit's Progress: On track  Recent Progress: Not on track  Priority: High  Note:    Current Barriers:  Unable to independently monitor therapeutic efficacy Does not adhere to prescribed medication regimen  Pharmacist Clinical Goal(s):  Patient will verbalize ability to afford treatment regimen achieve adherence to monitoring guidelines and medication adherence to achieve therapeutic efficacy achieve control of COPD as evidenced by less frequent use of rescue  inhaler contact provider office for questions/concerns as evidenced notation of same in electronic health record through collaboration with PharmD and provider.   Interventions: 1:1 collaboration with Plotnikov, Evie Lacks, MD regarding development and update of comprehensive plan of care as evidenced by provider attestation and co-signature Inter-disciplinary care team collaboration (see longitudinal plan of care) Comprehensive medication review performed; medication list updated in electronic medical record  COPD (Goal: control symptoms and prevent exacerbations) -Controlled -Current treatment  -Incruse ellpita 62.54mg -1 puff daily  -Ventolin HFA - 2 puffs q4h prn  -Medications previously tried: n/a  -Gold Grade: could not determine, spirometry has not yet been completed  -Current COPD Classification:   Could not complete as spirometry has not yet been completed  -MMRC/CAT score: CAT score = 13 -Pulmonary function testing: not yet completed  -Exacerbations requiring treatment in last 6 months: n/a -Patient denies consistent use of maintenance inhaler -Frequency of rescue inhaler use: reports that he has not had to use recently  -Counseled on Proper inhaler technique; Benefits of consistent maintenance inhaler use When to use rescue inhaler Differences between maintenance and rescue inhalers -Recommending for patient to continue current medications  Tobacco use (Goal Smoking Cessation) -Not ideally controlled -Previous  quit attempts: patient had quit smoking in 1990 - started smoking again in 2001 -Current treatment  N/a -Patient smokes Within 30 minutes of waking -Patient triggers include: stress and drinking coffee -On a scale of 1-10, reports MOTIVATION to quit is 5 -On a scale of 1-10, reports CONFIDENCE in quitting is 5 -Counseled on patch placement, side effects, and option to remove at night if they experience trouble sleeping or bad dreams.  Counseled on park & chew  method for NRT gum. Provided contact information for [place] [facility] (1-800-QUIT-NOW) and encouraged patient to reach out to this group for support. -Recommended for patient to set a quit date, reviewed with patient different medications / NRTs that could be used to aid in smoking cessation, patient will continue to work to set smoking cessation date - will call with any issues or concerns   Depression/Anxiety (Goal: prevention of anxiety attacks) -Controlled -Current treatment: Alprazolam 0.53m - 1 tablet BID prn  Elavil 169m- 1-2 tablets HS  -Medications previously tried/failed: n/a -PHQ9: 5 -GAD7: 5 -Educated on Benefits of medication for symptom control -Recommended to continue current medication  Chronic Back Pain / spasms (Goal: pain control / prevention of exacerbations) -Controlled -Current treatment  Flexeril 27m74mID - typically only taking once daily HS   -Medications previously tried: Tramadol, Norco, Hysingla ER, Voltaren, Naproxen   -Recommended to continue current medication  BPH (Goal: Maintain control / prevention progression) -Controlled -Current treatment  Tamsulosin 0.4mg39mily  -Medications previously tried: n/a  -Recommended to continue current medication   Allergic Rhinitis (Goal: Prevention of flares / treatment plan) -Controlled -Current treatment  Hydroxyzine 227mg48m tablet every 8 hours as needed for itching Allegra D 180-240mg 727mtablet daily   - Taking 3 times weekly Allegra 180mg -727mablet daily - takes on days that he does not take allegra D -Medications previously tried: n/a  -Recommended to continue current medication   Health Maintenance -Current therapy:  Viagra 100mg - 80mblet daily prn Hyoscyamine 0.1227mg - 164mlet q6h prn - for acid reflux if needed   Miralax - 17g  2 times daily as needed  Vitamin D 2000 units daily  Magnesium 250mg - 1 44met daily - usually takes 1 day weekly (for sleep) Vitamin C 500mg daily66mducated on  Herbal supplement research is limited and benefits usually cannot be proven Cost vs benefit of each product must be carefully weighed by individual consumer -Patient is satisfied with current therapy and denies issues -Recommended to continue current medication   Patient Goals/Self-Care Activities Patient will:  - take medications as prescribed focus on medication adherence by using maintenance inhaler daily as prescribed for COPD control      Medication Assistance:  none required at this time  Patient's preferred pharmacy is:  [facility] [place] [place] [phone]? No Pt endorses 75% compliance  Care Plan and Follow Up Patient Decision:  Patient agrees to Care Plan and Follow-up.  Plan: Telephone follow up appointment with care management team member scheduled for:  4 month  and The patient has been provided with contact information for the care management team and has been advised to call with any health related questions or concerns.    Keanthony Poole C SzTomasa Blaseinical Pharmacist,  [facility] Hospitalexamination/treatment/procedure(s) were performed  by non-physician practitioner and as supervising physician I was immediately available for consultation/collaboration.  I agree with above. Lew Dawes, MD

## 2020-12-02 ENCOUNTER — Encounter: Payer: Self-pay | Admitting: Internal Medicine

## 2020-12-02 ENCOUNTER — Ambulatory Visit (INDEPENDENT_AMBULATORY_CARE_PROVIDER_SITE_OTHER): Payer: Medicare Other | Admitting: Internal Medicine

## 2020-12-02 ENCOUNTER — Other Ambulatory Visit: Payer: Self-pay

## 2020-12-02 ENCOUNTER — Ambulatory Visit (INDEPENDENT_AMBULATORY_CARE_PROVIDER_SITE_OTHER): Payer: Medicare Other

## 2020-12-02 VITALS — BP 118/70 | HR 71 | Temp 98.3°F | Ht 67.0 in | Wt 141.0 lb

## 2020-12-02 DIAGNOSIS — Z0000 Encounter for general adult medical examination without abnormal findings: Secondary | ICD-10-CM

## 2020-12-02 DIAGNOSIS — R059 Cough, unspecified: Secondary | ICD-10-CM | POA: Diagnosis not present

## 2020-12-02 DIAGNOSIS — J449 Chronic obstructive pulmonary disease, unspecified: Secondary | ICD-10-CM | POA: Diagnosis not present

## 2020-12-02 DIAGNOSIS — F17200 Nicotine dependence, unspecified, uncomplicated: Secondary | ICD-10-CM

## 2020-12-02 DIAGNOSIS — R972 Elevated prostate specific antigen [PSA]: Secondary | ICD-10-CM

## 2020-12-02 DIAGNOSIS — R413 Other amnesia: Secondary | ICD-10-CM | POA: Diagnosis not present

## 2020-12-02 MED ORDER — ALBUTEROL SULFATE HFA 108 (90 BASE) MCG/ACT IN AERS: 2.0000 | INHALATION_SPRAY | RESPIRATORY_TRACT | 3 refills | Status: DC | PRN

## 2020-12-02 MED ORDER — TAMSULOSIN HCL 0.4 MG PO CAPS: 0.4000 mg | ORAL_CAPSULE | Freq: Every day | ORAL | 3 refills | Status: DC

## 2020-12-02 MED ORDER — DONEPEZIL HCL 5 MG PO TABS: 5.0000 mg | ORAL_TABLET | Freq: Every day | ORAL | 1 refills | Status: DC

## 2020-12-02 MED ORDER — ALPRAZOLAM 0.5 MG PO TABS: 0.5000 mg | ORAL_TABLET | Freq: Two times a day (BID) | ORAL | 1 refills | Status: DC | PRN

## 2020-12-02 MED ORDER — NORTRIPTYLINE HCL 10 MG PO CAPS: ORAL_CAPSULE | ORAL | 3 refills | Status: DC

## 2020-12-02 NOTE — Patient Instructions (Signed)

## 2020-12-02 NOTE — Patient Instructions (Signed)
[name] , Thank you for taking time to come for your Medicare Wellness Visit. I appreciate your ongoing commitment to your health goals. Please review the following plan we discussed and let me know if I can assist you in the future.   Screening recommendations/referrals: Colonoscopy: never done Recommended yearly ophthalmology/optometry visit for glaucoma screening and checkup Recommended yearly dental visit for hygiene and checkup  Vaccinations: Influenza vaccine: declined Pneumococcal vaccine: declined Tdap vaccine: 01/05/2015; due every 10 years Shingles vaccine: declined   Covid-19: declined  Advanced directives: Advance directive discussed with you today. Even though you declined this today please call our office should you change your mind and we can give you the proper paperwork for you to fill out.   Conditions/risks identified: Yes; Client understands the importance of follow-up with providers by attending scheduled visits and discussed goals to eat healthier, increase physical activity, exercise the brain, socialize more, get enough sleep and make time for laughter.  Next appointment: Please schedule your next Medicare Wellness Visit with your Nurse Health Advisor in 1 year by calling [phone].  Preventive Care 69 Years and Older, Male Preventive care refers to lifestyle choices and visits with your health care provider that can promote health and wellness. What does preventive care include? A yearly physical exam. This is also called an annual well check. Dental exams once or twice a year. Routine eye exams. Ask your health care provider how often you should have your eyes checked. Personal lifestyle choices, including: Daily care of your teeth and gums. Regular physical activity. Eating a healthy diet. Avoiding tobacco and drug use. Limiting alcohol use. Practicing safe sex. Taking low doses of aspirin every day. Taking vitamin and mineral supplements as  recommended by your health care provider. What happens during an annual well check? The services and screenings done by your health care provider during your annual well check will depend on your age, overall health, lifestyle risk factors, and family history of disease. Counseling  Your health care provider may ask you questions about your: Alcohol use. Tobacco use. Drug use. Emotional well-being. Home and relationship well-being. Sexual activity. Eating habits. History of falls. Memory and ability to understand (cognition). Work and work Statistician. Screening  You may have the following tests or measurements: Height, weight, and BMI. Blood pressure. Lipid and cholesterol levels. These may be checked every 5 years, or more frequently if you are over 7 years old. Skin check. Lung cancer screening. You may have this screening every year starting at age 57 if you have a 30-pack-year history of smoking and currently smoke or have quit within the past 15 years. Fecal occult blood test (FOBT) of the stool. You may have this test every year starting at age 65. Flexible sigmoidoscopy or colonoscopy. You may have a sigmoidoscopy every 5 years or a colonoscopy every 10 years starting at age 53. Prostate cancer screening. Recommendations will vary depending on your family history and other risks. Hepatitis C blood test. Hepatitis B blood test. Sexually transmitted disease (STD) testing. Diabetes screening. This is done by checking your blood sugar (glucose) after you have not eaten for a while (fasting). You may have this done every 1-3 years. Abdominal aortic aneurysm (AAA) screening. You may need this if you are a current or former smoker. Osteoporosis. You may be screened starting at age 50 if you are at high risk. Talk with your health care provider about your test results, treatment options, and if necessary, the need for more tests.  Vaccines  Your health care provider may recommend  certain vaccines, such as: Influenza vaccine. This is recommended every year. Tetanus, diphtheria, and acellular pertussis (Tdap, Td) vaccine. You may need a Td booster every 10 years. Zoster vaccine. You may need this after age 21. Pneumococcal 13-valent conjugate (PCV13) vaccine. One dose is recommended after age 25. Pneumococcal polysaccharide (PPSV23) vaccine. One dose is recommended after age 80. Talk to your health care provider about which screenings and vaccines you need and how often you need them. This information is not intended to replace advice given to you by your health care provider. Make sure you discuss any questions you have with your health care provider. Document Released: 02/12/2015 Document Revised: 10/06/2015 Document Reviewed: 11/17/2014 Elsevier Interactive Patient Education  2017 [employer] Prevention in the Home Falls can cause injuries. They can happen to people of all ages. There are many things you can do to make your home safe and to help prevent falls. What can I do on the outside of my home? Regularly fix the edges of walkways and driveways and fix any cracks. Remove anything that might make you trip as you walk through a door, such as a raised step or threshold. Trim any bushes or trees on the path to your home. Use bright outdoor lighting. Clear any walking paths of anything that might make someone trip, such as rocks or tools. Regularly check to see if handrails are loose or broken. Make sure that both sides of any steps have handrails. Any raised decks and porches should have guardrails on the edges. Have any leaves, snow, or ice cleared regularly. Use sand or salt on walking paths during winter. Clean up any spills in your garage right away. This includes oil or grease spills. What can I do in the bathroom? Use night lights. Install grab bars by the toilet and in the tub and shower. Do not use towel bars as grab bars. Use non-skid mats or  decals in the tub or shower. If you need to sit down in the shower, use a plastic, non-slip stool. Keep the floor dry. Clean up any water that spills on the floor as soon as it happens. Remove soap buildup in the tub or shower regularly. Attach bath mats securely with double-sided non-slip rug tape. Do not have throw rugs and other things on the floor that can make you trip. What can I do in the bedroom? Use night lights. Make sure that you have a light by your bed that is easy to reach. Do not use any sheets or blankets that are too big for your bed. They should not hang down onto the floor. Have a firm chair that has side arms. You can use this for support while you get dressed. Do not have throw rugs and other things on the floor that can make you trip. What can I do in the kitchen? Clean up any spills right away. Avoid walking on wet floors. Keep items that you use a lot in easy-to-reach places. If you need to reach something above you, use a strong step stool that has a grab bar. Keep electrical cords out of the way. Do not use floor polish or wax that makes floors slippery. If you must use wax, use non-skid floor wax. Do not have throw rugs and other things on the floor that can make you trip. What can I do with my stairs? Do not leave any items on the stairs. Make sure that  there are handrails on both sides of the stairs and use them. Fix handrails that are broken or loose. Make sure that handrails are as long as the stairways. Check any carpeting to make sure that it is firmly attached to the stairs. Fix any carpet that is loose or worn. Avoid having throw rugs at the top or bottom of the stairs. If you do have throw rugs, attach them to the floor with carpet tape. Make sure that you have a light switch at the top of the stairs and the bottom of the stairs. If you do not have them, ask someone to add them for you. What else can I do to help prevent falls? Wear shoes that: Do not  have high heels. Have rubber bottoms. Are comfortable and fit you well. Are closed at the toe. Do not wear sandals. If you use a stepladder: Make sure that it is fully opened. Do not climb a closed stepladder. Make sure that both sides of the stepladder are locked into place. Ask someone to hold it for you, if possible. Clearly mark and make sure that you can see: Any grab bars or handrails. First and last steps. Where the edge of each step is. Use tools that help you move around (mobility aids) if they are needed. These include: Canes. Walkers. Scooters. Crutches. Turn on the lights when you go into a dark area. Replace any light bulbs as soon as they burn out. Set up your furniture so you have a clear path. Avoid moving your furniture around. If any of your floors are uneven, fix them. If there are any pets around you, be aware of where they are. Review your medicines with your doctor. Some medicines can make you feel dizzy. This can increase your chance of falling. Ask your doctor what other things that you can do to help prevent falls. This information is not intended to replace advice given to you by your health care provider. Make sure you discuss any questions you have with your health care provider. Document Released: 11/12/2008 Document Revised: 06/24/2015 Document Reviewed: 02/20/2014 Elsevier Interactive Patient Education  2017 [employer].

## 2020-12-02 NOTE — Assessment & Plan Note (Signed)
  A cardiac CT scan for calcium scoring offered CXR

## 2020-12-02 NOTE — Assessment & Plan Note (Signed)
Monitor PSA 

## 2020-12-02 NOTE — Progress Notes (Signed)
Subjective:  Patient ID: Joseph Preston, male    DOB: 17-Dec-1950  Age: 70 y.o. MRN: [id number]  CC: Annual Exam   HPI Joseph Preston presents for anxiety, smoking/COPD C/o being forgetful - worse in the past 6 months  Outpatient Medications Prior to Visit  Medication Sig Dispense Refill   Cholecalciferol (VITAMIN D) 2000 units CAPS 1 po qd 100 capsule 3   cyclobenzaprine (FLEXERIL) 5 MG tablet TAKE 1 TABLET BY MOUTH TWICE DAILY AS NEEDED FOR MUSCLE SPASM (Patient taking differently: Take 5 mg by mouth. TAKE 1 TABLET BY MOUTH TWICE DAILY AS NEEDED FOR MUSCLE SPASM) 180 tablet 0   fexofenadine (ALLEGRA) 180 MG tablet Take 180 mg by mouth daily as needed.     fexofenadine-pseudoephedrine (ALLEGRA-D ALLERGY & CONGESTION) 180-240 MG 24 hr tablet Take 1 tablet by mouth daily as needed. 90 tablet 1   hydrOXYzine (ATARAX/VISTARIL) 25 MG tablet Take 1 tablet (25 mg total) by mouth every 8 (eight) hours as needed for itching. 60 tablet 0   hyoscyamine (LEVSIN, ANASPAZ) 0.125 MG tablet TAKE ONE TABLET BY MOUTH EVERY 6 HOURS AS NEEDED (Patient taking differently: Take 0.125 mg by mouth every 6 (six) hours as needed.) 100 tablet 1   magnesium gluconate (MAGONATE) 500 MG tablet Take 250 mg by mouth daily. Uses typically once weekly - helps with his sleep     sildenafil (VIAGRA) 100 MG tablet TAKE 1 TABLET BY MOUTH  DAILY AS NEEDED FOR  ERECTILE DYSFUNCTION 12 tablet 3   umeclidinium bromide (INCRUSE ELLIPTA) 62.5 MCG/INH AEPB Inhale 1 puff into the lungs daily. 3 each 3   vitamin C (ASCORBIC ACID) 500 MG tablet Take 500 mg by mouth daily.     ALPRAZolam (XANAX) 0.5 MG tablet Take 1 tablet (0.5 mg total) by mouth 2 (two) times daily as needed. for anxiety 180 tablet 1   nortriptyline (PAMELOR) 10 MG capsule TAKE 1 TO 2 CAPSULES BY MOUTH ONCE DAILY AT BEDTIME 180 capsule 3   tamsulosin (FLOMAX) 0.4 MG CAPS capsule Take 1 capsule (0.4 mg total) by mouth daily. 90 capsule 3   albuterol (PROAIR HFA)  108 (90 Base) MCG/ACT inhaler Inhale 2 puffs into the lungs every 4 (four) hours as needed for wheezing or shortness of breath. 54 g 3   polyethylene glycol powder (GLYCOLAX/MIRALAX) powder Take 17 g by mouth 2 (two) times daily as needed. (Patient not taking: No sig reported) 500 g 2   No facility-administered medications prior to visit.    ROS: Review of Systems  Constitutional:  Negative for appetite change, fatigue and unexpected weight change.  HENT:  Negative for congestion, nosebleeds, sneezing, sore throat and trouble swallowing.   Eyes:  Negative for itching and visual disturbance.  Respiratory:  Negative for cough.   Cardiovascular:  Negative for chest pain, palpitations and leg swelling.  Gastrointestinal:  Negative for abdominal distention, blood in stool, diarrhea and nausea.  Genitourinary:  Negative for frequency and hematuria.  Musculoskeletal:  Negative for back pain, gait problem, joint swelling and neck pain.  Skin:  Negative for rash.  Neurological:  Negative for dizziness, tremors, speech difficulty and weakness.  Psychiatric/Behavioral:  Positive for decreased concentration. Negative for agitation, dysphoric mood and sleep disturbance. The patient is nervous/anxious.    Objective:  BP 118/70 (BP Location: Left Arm)   Pulse 71   Temp 98.3 F (36.8 C) (Oral)   Ht 5\' 7"  (9.390 m)   Wt 141 lb (64 kg)  SpO2 97%   BMI 22.08 kg/m   BP Readings from Last 3 Encounters:  12/02/20 118/70  12/02/20 118/70  05/31/20 130/72    Wt Readings from Last 3 Encounters:  12/02/20 141 lb (64 kg)  12/02/20 141 lb (64 kg)  05/31/20 145 lb 3.2 oz (65.9 kg)    Physical Exam Constitutional:      General: He is not in acute distress.    Appearance: Normal appearance. He is well-developed.     Comments: NAD  Eyes:     Conjunctiva/sclera: Conjunctivae normal.     Pupils: Pupils are equal, round, and reactive to light.  Neck:     Thyroid: No thyromegaly.     Vascular: No  JVD.  Cardiovascular:     Rate and Rhythm: Normal rate and regular rhythm.     Heart sounds: Normal heart sounds. No murmur heard.   No friction rub. No gallop.  Pulmonary:     Effort: Pulmonary effort is normal. No respiratory distress.     Breath sounds: Normal breath sounds. No wheezing or rales.  Chest:     Chest wall: No tenderness.  Abdominal:     General: Bowel sounds are normal. There is no distension.     Palpations: Abdomen is soft. There is no mass.     Tenderness: There is no abdominal tenderness. There is no guarding or rebound.  Musculoskeletal:        General: No tenderness. Normal range of motion.     Cervical back: Normal range of motion.  Lymphadenopathy:     Cervical: No cervical adenopathy.  Skin:    General: Skin is warm and dry.     Findings: No rash.  Neurological:     Mental Status: He is alert and oriented to person, place, and time.     Cranial Nerves: No cranial nerve deficit.     Motor: No abnormal muscle tone.     Coordination: Coordination normal.     Gait: Gait normal.     Deep Tendon Reflexes: Reflexes are normal and symmetric.  Psychiatric:        Behavior: Behavior normal.        Thought Content: Thought content normal.        Judgment: Judgment normal.    Lab Results  Component Value Date   WBC 8.5 05/31/2020   HGB 15.1 05/31/2020   HCT 44.9 05/31/2020   PLT 226.0 05/31/2020   GLUCOSE 83 05/31/2020   CHOL 213 (H) 09/25/2018   TRIG 135.0 09/25/2018   HDL 82.40 09/25/2018   LDLCALC 104 (H) 09/25/2018   ALT 23 05/31/2020   AST 22 05/31/2020   NA 139 05/31/2020   K 4.0 05/31/2020   CL 101 05/31/2020   CREATININE 0.91 05/31/2020   BUN 16 05/31/2020   CO2 28 05/31/2020   TSH 1.27 05/31/2020   PSA 13.19 (H) 05/31/2020   HGBA1C 5.7 03/10/2016    No results found.  Assessment & Plan:   Problem List Items Addressed This Visit     COPD (chronic obstructive pulmonary disease) ([place])    Smoking discussed Worse - will get  CXR Cont w/inhaler      Relevant Medications   albuterol (PROAIR HFA) 108 (90 Base) MCG/ACT inhaler   Other Relevant Orders   DG Chest 2 View   Elevated PSA    Monitor PSA      Memory problem    Options discussed OK to d/c Lion's mane - no help  Start Aricept at hs Reduce Xanax prn      SMOKER     A cardiac CT scan for calcium scoring offered CXR         Meds ordered this encounter  Medications   donepezil (ARICEPT) 5 MG tablet    Sig: Take 1 tablet (5 mg total) by mouth at bedtime.    Dispense:  90 tablet    Refill:  1   albuterol (PROAIR HFA) 108 (90 Base) MCG/ACT inhaler    Sig: Inhale 2 puffs into the lungs every 4 (four) hours as needed for wheezing or shortness of breath.    Dispense:  54 g    Refill:  3   ALPRAZolam (XANAX) 0.5 MG tablet    Sig: Take 1 tablet (0.5 mg total) by mouth 2 (two) times daily as needed. for anxiety    Dispense:  180 tablet    Refill:  1    3 months supply   nortriptyline (PAMELOR) 10 MG capsule    Sig: TAKE 1 TO 2 CAPSULES BY MOUTH ONCE DAILY AT BEDTIME    Dispense:  180 capsule    Refill:  3    Please consider 90 day supplies to promote better adherence   tamsulosin (FLOMAX) 0.4 MG CAPS capsule    Sig: Take 1 capsule (0.4 mg total) by mouth daily.    Dispense:  90 capsule    Refill:  3       Follow-up: Return in about 3 months (around 03/04/2021) for a follow-up visit.  Walker Kehr, MD

## 2020-12-02 NOTE — Assessment & Plan Note (Addendum)
Smoking discussed Worse - will get CXR Cont w/inhaler

## 2020-12-02 NOTE — Progress Notes (Addendum)
Subjective:   Joseph Preston is a 70 y.o. male who presents for Medicare Annual/Subsequent preventive examination.  Review of Systems     Cardiac Risk Factors include: advanced age (>54men, >57 women);male gender;smoking/ tobacco exposure     Objective:    Today's Vitals   12/02/20 1038  BP: 118/70  Pulse: 71  Temp: 98.3 F (36.8 C)  SpO2: 97%  Weight: 141 lb (64 kg)  Height: 5\' 7"  (1.702 m)  PainSc: 0-No pain   Body mass index is 22.08 kg/m.  Advanced Directives 12/02/2020 01/31/2020  Does Patient Have a Medical Advance Directive? No No  Would patient like information on creating a medical advance directive? No - Patient declined No - Patient declined    Current Medications (verified) Outpatient Encounter Medications as of 12/02/2020  Medication Sig   albuterol (PROAIR HFA) 108 (90 Base) MCG/ACT inhaler Inhale 2 puffs into the lungs every 4 (four) hours as needed for wheezing or shortness of breath.   ALPRAZolam (XANAX) 0.5 MG tablet Take 1 tablet (0.5 mg total) by mouth 2 (two) times daily as needed. for anxiety   Cholecalciferol (VITAMIN D) 2000 units CAPS 1 po qd   cyclobenzaprine (FLEXERIL) 5 MG tablet TAKE 1 TABLET BY MOUTH TWICE DAILY AS NEEDED FOR MUSCLE SPASM (Patient taking differently: Take 5 mg by mouth. TAKE 1 TABLET BY MOUTH TWICE DAILY AS NEEDED FOR MUSCLE SPASM)   fexofenadine (ALLEGRA) 180 MG tablet Take 180 mg by mouth daily.   fexofenadine-pseudoephedrine (ALLEGRA-D ALLERGY & CONGESTION) 180-240 MG 24 hr tablet Take 1 tablet by mouth daily as needed.   hydrOXYzine (ATARAX/VISTARIL) 25 MG tablet Take 1 tablet (25 mg total) by mouth every 8 (eight) hours as needed for itching.   hyoscyamine (LEVSIN, ANASPAZ) 0.125 MG tablet TAKE ONE TABLET BY MOUTH EVERY 6 HOURS AS NEEDED (Patient taking differently: Take 0.125 mg by mouth every 6 (six) hours as needed.)   magnesium gluconate (MAGONATE) 500 MG tablet Take 250 mg by mouth daily. Uses typically once weekly -  helps with his sleep   nortriptyline (PAMELOR) 10 MG capsule TAKE 1 TO 2 CAPSULES BY MOUTH ONCE DAILY AT BEDTIME   polyethylene glycol powder (GLYCOLAX/MIRALAX) powder Take 17 g by mouth 2 (two) times daily as needed. (Patient not taking: No sig reported)   sildenafil (VIAGRA) 100 MG tablet TAKE 1 TABLET BY MOUTH  DAILY AS NEEDED FOR  ERECTILE DYSFUNCTION   tamsulosin (FLOMAX) 0.4 MG CAPS capsule Take 1 capsule (0.4 mg total) by mouth daily.   umeclidinium bromide (INCRUSE ELLIPTA) 62.5 MCG/INH AEPB Inhale 1 puff into the lungs daily.   vitamin C (ASCORBIC ACID) 500 MG tablet Take 500 mg by mouth daily.   No facility-administered encounter medications on file as of 12/02/2020.    Allergies (verified) Penicillins, Sulfonamide derivatives, Varenicline tartrate, and Tramadol   History: Past Medical History:  Diagnosis Date   Anxiety    Arthritis    Back pain, chronic    Past Surgical History:  Procedure Laterality Date   COLONOSCOPY W/ POLYPECTOMY     LEG SURGERY     Family History  Problem Relation Age of Onset   Cancer Mother 51       lung ca   Cancer Father 19       glioblastoma   Social History   Socioeconomic History   Marital status: Legally Separated    Spouse name: Not on file   Number of children: Not on file   Years  of education: Not on file   Highest education level: Not on file  Occupational History   Not on file  Tobacco Use   Smoking status: Every Day    Packs/day: 1.00    Years: 19.00    Pack years: 19.00    Types: Cigarettes   Smokeless tobacco: Former   Tobacco comments:    working towards being ready to quit  Substance and Sexual Activity   Alcohol use: No   Drug use: No   Sexual activity: Yes  Other Topics Concern   Not on file  Social History Narrative   Not on file   Social Determinants of Health   Financial Resource Strain: Low Risk    Difficulty of Paying Living Expenses: Not hard at all  Food Insecurity: No Food Insecurity    Worried About Charity fundraiser in the Last Year: Never true   [place] in the Last Year: Never true  Transportation Needs: No Transportation Needs   Lack of Transportation (Medical): No   Lack of Transportation (Non-Medical): No  Physical Activity: Sufficiently Active   Days of Exercise per Week: 5 days   Minutes of Exercise per Session: 30 min  Stress: No Stress Concern Present   Feeling of Stress : Not at all  Social Connections: Moderately Integrated   Frequency of Communication with Friends and Family: More than three times a week   Frequency of Social Gatherings with Friends and Family: More than three times a week   Attends Religious Services: More than 4 times per year   Active Member of [employer] or Organizations: Yes   Attends Music therapist: More than 4 times per year   Marital Status: Separated    Tobacco Counseling Ready to quit: Not Answered Counseling given: Not Answered Tobacco comments: working towards being ready to quit   Clinical Intake:  Pre-visit preparation completed: Yes  Pain : No/denies pain Pain Score: 0-No pain     BMI - recorded: 22.08 Nutritional Status: BMI of 19-24  Normal Nutritional Risks: None Diabetes: No  How often do you need to have someone help you when you read instructions, pamphlets, or other written materials from your doctor or pharmacy?: 1 - Never 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 no  Interpreter Needed?: No  Information entered by :: Lisette Abu, LPN   Activities of Daily Living In your present state of health, do you have any difficulty performing the following activities: 12/02/2020  Hearing? N  Vision? Y  Difficulty concentrating or making decisions? Y  Walking or climbing stairs? N  Dressing or bathing? N  Doing errands, shopping? N  Preparing Food and eating ? N  Using the Toilet? N  In the past six months, have you  accidently leaked urine? N  Do you have problems with loss of bowel control? N  Managing your Medications? N  Managing your Finances? N  Housekeeping or managing your Housekeeping? N  Some recent data might be hidden    Patient Care Team: Plotnikov, Evie Lacks, MD as PCP - General (Internal Medicine) Szabat, Darnelle Maffucci, [facility] (Pharmacist) Szabat, Darnelle Maffucci, [facility] as Pharmacist (Pharmacist) Monna Fam, MD as Consulting Physician (Ophthalmology) Sheral Flow, LPN as Licensed Practical Nurse (Internal Medicine)  Indicate any recent Medical Services you may have received from other than [facility] providers in the past year (date may be approximate).     Assessment:  This is a routine wellness examination for [employer].  Hearing/Vision screen Hearing Screening - Comments:: Patient denied any hearing difficulty.   No hearing aids.  Vision Screening - Comments:: Patient wears corrective glasses/contacts.  Eye exam done annually by: [facility]  Dietary issues and exercise activities discussed: Current Exercise Habits: Home exercise routine;The patient has a physically strenuous job, but has no regular exercise apart from work., Type of exercise: walking, Time (Minutes): 30, Frequency (Times/Week): 5, Weekly Exercise (Minutes/Week): 150, Intensity: Moderate, Exercise limited by: respiratory conditions(s)   Goals Addressed   None   Depression Screen PHQ 2/9 Scores 12/02/2020 06/18/2020 10/10/2016  PHQ - 2 Score 0 1 3  PHQ- 9 Score - 5 13    Fall Risk Fall Risk  12/02/2020 10/10/2016  Falls in the past year? 0 No  Number falls in past yr: 0 -  Injury with Fall? 0 -  Risk for fall due to : No Fall Risks -  Follow up Falls evaluation completed -    FALL RISK PREVENTION PERTAINING TO THE HOME:  Any stairs in or around the home? Yes  If so, are there any without handrails? No  Home free of loose throw rugs in walkways, pet beds, electrical cords, etc? Yes  Adequate lighting in  your home to reduce risk of falls? Yes   ASSISTIVE DEVICES UTILIZED TO PREVENT FALLS:  Life alert? No  Use of a cane, walker or w/c? No  Grab bars in the bathroom? Yes  Shower chair or bench in shower? No  Elevated toilet seat or a handicapped toilet? No   TIMED UP AND GO:  Was the test performed? Yes .  Length of time to ambulate 10 feet: 6 sec.   Gait steady and fast without use of assistive device  Cognitive Function: Normal cognitive status assessed by direct observation by this Nurse Health Advisor. No abnormalities found.          Immunizations Immunization History  Administered Date(s) Administered   Hepatitis B, adult 12/18/2013, 01/28/2014, 06/10/2014   Influenza Whole 02/27/2005   Tdap 01/05/2015    TDAP status: Up to date  Flu Vaccine status: Declined, Education has been provided regarding the importance of this vaccine but patient still declined. Advised may receive this vaccine at local pharmacy or Health Dept. Aware to provide a copy of the vaccination record if obtained from local pharmacy or Health Dept. Verbalized acceptance and understanding.  Pneumococcal vaccine status: Declined,  Education has been provided regarding the importance of this vaccine but patient still declined. Advised may receive this vaccine at local pharmacy or Health Dept. Aware to provide a copy of the vaccination record if obtained from local pharmacy or Health Dept. Verbalized acceptance and understanding.   Covid-19 vaccine status: Declined, Education has been provided regarding the importance of this vaccine but patient still declined. Advised may receive this vaccine at local pharmacy or Health Dept.or vaccine clinic. Aware to provide a copy of the vaccination record if obtained from local pharmacy or Health Dept. Verbalized acceptance and understanding.  Qualifies for Shingles Vaccine? Yes   Zostavax completed No   Shingrix Completed?: No.    Education has been provided regarding  the importance of this vaccine. Patient has been advised to call insurance company to determine out of pocket expense if they have not yet received this vaccine. Advised may also receive vaccine at local pharmacy or Health Dept. Verbalized acceptance and understanding.  Screening Tests Health Maintenance  Topic Date Due  Pneumonia Vaccine 51+ Years old (1 - PCV) Never done   Zoster Vaccines- Shingrix (1 of 2) Never done   COLONOSCOPY (Pts 45-75yrs Insurance coverage will need to be confirmed)  12/27/2015   INFLUENZA VACCINE  08/30/2020   COVID-19 Vaccine (1) 06/17/2022 (Originally 10/22/1950)   TETANUS/TDAP  01/04/2025   Hepatitis C Screening  Completed   HPV VACCINES  Aged Out    Health Maintenance  Health Maintenance Due  Topic Date Due   Pneumonia Vaccine 49+ Years old (1 - PCV) Never done   Zoster Vaccines- Shingrix (1 of 2) Never done   COLONOSCOPY (Pts 45-51yrs Insurance coverage will need to be confirmed)  12/27/2015   INFLUENZA VACCINE  08/30/2020    Colorectal screening: no record  Lung Cancer Screening: (Low Dose CT Chest recommended if Age 24-80 years, 30 pack-year currently smoking OR have quit w/in 15years.) does qualify.   Lung Cancer Screening Referral: no  Additional Screening:  Hepatitis C Screening: does qualify; Completed yes  Vision Screening: Recommended annual ophthalmology exams for early detection of glaucoma and other disorders of the eye. Is the patient up to date with their annual eye exam?  Yes  Who is the provider or what is the name of the office in which the patient attends annual eye exams? [facility] If pt is not established with a provider, would they like to be referred to a provider to establish care? No .   Dental Screening: Recommended annual dental exams for proper oral hygiene  Community Resource Referral / Chronic Care Management: CRR required this visit?  No   CCM required this visit?  No      Plan:     I have  personally reviewed and noted the following in the patient's chart:   Medical and social history Use of alcohol, tobacco or illicit drugs  Current medications and supplements including opioid prescriptions. Patient is not currently taking opioid prescriptions. Functional ability and status Nutritional status Physical activity Advanced directives List of other physicians Hospitalizations, surgeries, and ER visits in previous 12 months Vitals Screenings to include cognitive, depression, and falls Referrals and appointments  In addition, I have reviewed and discussed with patient certain preventive protocols, quality metrics, and best practice recommendations. A written personalized care plan for preventive services as well as general preventive health recommendations were provided to patient.     Sheral Flow, LPN   [id number]   Nurse Notes:  Hearing Screening - Comments:: Patient denied any hearing difficulty.   No hearing aids.  Vision Screening - Comments:: Patient wears corrective glasses/contacts.  Eye exam done annually by: [facility] screening examination/treatment/procedure(s) were performed by non-physician practitioner and as supervising physician I was immediately available for consultation/collaboration.  I agree with above. Lew Dawes, MD

## 2020-12-02 NOTE — Assessment & Plan Note (Addendum)
Options discussed OK to d/c Lion's mane - no help  Start Aricept at hs Reduce Xanax prn

## 2020-12-03 ENCOUNTER — Telehealth: Payer: Self-pay

## 2020-12-03 NOTE — Progress Notes (Signed)
Chronic Care Management Pharmacy Assistant   Name: Joseph Preston  MRN: [id number] DOB: [birth year]   Reason for Encounter: Disease State   Conditions to be addressed/monitored: COPD   Recent office visits:  12/02/20 Plotnikov, Evie Lacks, MD-PCP (Memory problem, Annual Exam)  Orders: DG chest 2 views, medication changes: donepezil 5 mg  Recent consult visits:  None ID  Hospital visits:  None in previous 6 months  Medications: Outpatient Encounter Medications as of 12/03/2020  Medication Sig Note   albuterol (PROAIR HFA) 108 (90 Base) MCG/ACT inhaler Inhale 2 puffs into the lungs every 4 (four) hours as needed for wheezing or shortness of breath.    ALPRAZolam (XANAX) 0.5 MG tablet Take 1 tablet (0.5 mg total) by mouth 2 (two) times daily as needed. for anxiety    Cholecalciferol (VITAMIN D) 2000 units CAPS 1 po qd    cyclobenzaprine (FLEXERIL) 5 MG tablet TAKE 1 TABLET BY MOUTH TWICE DAILY AS NEEDED FOR MUSCLE SPASM (Patient taking differently: Take 5 mg by mouth. TAKE 1 TABLET BY MOUTH TWICE DAILY AS NEEDED FOR MUSCLE SPASM) 06/18/2020: Usually only taking at bedtime, at times will take an additional time in the day     donepezil (ARICEPT) 5 MG tablet Take 1 tablet (5 mg total) by mouth at bedtime.    fexofenadine (ALLEGRA) 180 MG tablet Take 180 mg by mouth daily as needed. 06/18/2020: Takes if he does not take the allegraD    fexofenadine-pseudoephedrine (ALLEGRA-D ALLERGY & CONGESTION) 180-240 MG 24 hr tablet Take 1 tablet by mouth daily as needed. 06/18/2020: Takes 3 times per week   hydrOXYzine (ATARAX/VISTARIL) 25 MG tablet Take 1 tablet (25 mg total) by mouth every 8 (eight) hours as needed for itching.    hyoscyamine (LEVSIN, ANASPAZ) 0.125 MG tablet TAKE ONE TABLET BY MOUTH EVERY 6 HOURS AS NEEDED (Patient taking differently: Take 0.125 mg by mouth every 6 (six) hours as needed.)    magnesium gluconate (MAGONATE) 500 MG tablet Take 250 mg by mouth daily. Uses typically  once weekly - helps with his sleep    nortriptyline (PAMELOR) 10 MG capsule TAKE 1 TO 2 CAPSULES BY MOUTH ONCE DAILY AT BEDTIME    sildenafil (VIAGRA) 100 MG tablet TAKE 1 TABLET BY MOUTH  DAILY AS NEEDED FOR  ERECTILE DYSFUNCTION    tamsulosin (FLOMAX) 0.4 MG CAPS capsule Take 1 capsule (0.4 mg total) by mouth daily.    umeclidinium bromide (INCRUSE ELLIPTA) 62.5 MCG/INH AEPB Inhale 1 puff into the lungs daily.    vitamin C (ASCORBIC ACID) 500 MG tablet Take 500 mg by mouth daily.    No facility-administered encounter medications on file as of 12/03/2020.   Reviewed chart prior to disease state call. Spoke with patient regarding BP  Recent Office Vitals: BP Readings from Last 3 Encounters:  12/02/20 118/70  12/02/20 118/70  05/31/20 130/72   Pulse Readings from Last 3 Encounters:  12/02/20 71  12/02/20 71  05/31/20 74    Wt Readings from Last 3 Encounters:  12/02/20 141 lb (64 kg)  12/02/20 141 lb (64 kg)  05/31/20 145 lb 3.2 oz (65.9 kg)     Kidney Function Lab Results  Component Value Date/Time   CREATININE 0.91 05/31/2020 12:08 PM   CREATININE 1.19 09/25/2018 10:01 AM   GFR 86.24 05/31/2020 12:08 PM    BMP Latest Ref Rng & Units 05/31/2020 09/25/2018 04/03/2018  Glucose 70 - 99 mg/dL 83 104(H) 86  BUN 6 - 23 mg/dL  16 15 20   Creatinine 0.40 - 1.50 mg/dL 0.91 1.19 1.03  Sodium 135 - 145 mEq/L 139 140 139  Potassium 3.5 - 5.1 mEq/L 4.0 4.8 4.5  Chloride 96 - 112 mEq/L 101 102 103  CO2 19 - 32 mEq/L 28 30 31   Calcium 8.4 - 10.5 mg/dL 10.3 10.5 10.9(H)    Current COPD regimen: incruse ellptia 62.5 mcg-1 puff daily     Albuterol 108 (90 base) mcg inhale 2 puffs prn No flowsheet data found. Any recent hospitalizations or ED visits since last visit with CPP? No  Reports COPD symptoms, including Wheezing  What recent interventions/DTPs have been made by any provider to improve breathing since last visit:Continue with inhalers, per Dr Alain Marion  Have you had  exacerbation/flare-up since last visit? No  What do you do when you are short of breath?  Patient states he sits down to rest  Respiratory Devices/Equipment Do you have a nebulizer? No Do you use a Peak Flow Meter? No Do you use a maintenance inhaler? Yes How often do you forget to use your daily inhaler? Takes every day Do you use a rescue inhaler? Yes How often do you use your rescue inhaler?  prn Do you use a spacer with your inhaler? No  Adherence Review: Does the patient have >5 day gap between last estimated fill date for maintenance inhaler medications? No   Care Gaps: Colonoscopy-12/26/05 Diabetic Foot Exam-NA Ophthalmology-NA Dexa Scan - NA Annual Well Visit - NA Micro albumin-NA Hemoglobin A1c- NA  Star Rating Drugs: None ID  Ethelene Hal Clinical Pharmacist Assistant [phone]

## 2021-01-07 ENCOUNTER — Other Ambulatory Visit: Payer: Self-pay

## 2021-01-07 ENCOUNTER — Ambulatory Visit: Payer: Medicare Other

## 2021-01-07 DIAGNOSIS — F411 Generalized anxiety disorder: Secondary | ICD-10-CM

## 2021-01-07 DIAGNOSIS — J449 Chronic obstructive pulmonary disease, unspecified: Secondary | ICD-10-CM

## 2021-01-07 NOTE — Progress Notes (Addendum)
Chronic Care Management Pharmacy Note  01/07/2021 Name:  Joseph Preston MRN:  [id number] DOB:  [birth year]  Summary: -trialed donepezil stopped after 3 doses due to stomach upset / dizziness / nausea  -Reports that he has quit smoking 2 weeks ago - notes to increase in appetite, improvement in breathing  -Continues use of incruse inhaler daily - COPD well controlled as of late, no recent exacerbations   Recommendations/Changes made from today's visit: -Recommending no changes to medications at this time, encouraged patient to reach out to office if he has any issues with breathing / if he is in need of any additional resources to continue his smoking cessation  Subjective: Joseph Preston is an 70 y.o. year old male who is a primary patient of Plotnikov, Evie Lacks, MD.  The CCM team was consulted for assistance with disease management and care coordination needs.    Engaged with patient by telephone for follow up visit in response to provider referral for pharmacy case management and/or care coordination services.   Consent to Services:  The patient was given the following information about Chronic Care Management services today, agreed to services, and gave verbal consent: 1. CCM service includes personalized support from designated clinical staff supervised by the primary care provider, including individualized plan of care and coordination with other care providers 2. 24/7 contact phone numbers for assistance for urgent and routine care needs. 3. Service will only be billed when office clinical staff spend 20 minutes or more in a month to coordinate care. 4. Only one practitioner may furnish and bill the service in a calendar month. 5.The patient may stop CCM services at any time (effective at the end of the month) by phone call to the office staff. 6. The patient will be responsible for cost sharing (co-pay) of up to 20% of the service fee (after annual deductible is met). Patient agreed  to services and consent obtained.  Patient Care Team: Plotnikov, Evie Lacks, MD as PCP - General (Internal Medicine) Raenell Mensing, Darnelle Maffucci, [facility] (Pharmacist) Delice Bison Darnelle Maffucci, [facility] as Pharmacist (Pharmacist) Monna Fam, MD as Consulting Physician (Ophthalmology) Sheral Flow, LPN as Licensed Practical Nurse (Internal Medicine)  Recent office visits: 12/02/2020 - Dr. Alain Marion - memory issues - start donepezil - reduce alprazolam  Recent consult visits: No changes since last visit   Hospital visits: None in the past 6 months  Objective:  Lab Results  Component Value Date   CREATININE 0.91 05/31/2020   BUN 16 05/31/2020   GFR 86.24 05/31/2020   NA 139 05/31/2020   K 4.0 05/31/2020   CALCIUM 10.3 05/31/2020   CO2 28 05/31/2020   GLUCOSE 83 05/31/2020    Lab Results  Component Value Date/Time   HGBA1C 5.7 03/10/2016 02:00 PM   GFR 86.24 05/31/2020 12:08 PM   GFR 60.90 09/25/2018 10:01 AM    Last diabetic Eye exam:  No results found for: HMDIABEYEEXA  Last diabetic Foot exam:  No results found for: HMDIABFOOTEX   Lab Results  Component Value Date   CHOL 213 (H) 09/25/2018   HDL 82.40 09/25/2018   LDLCALC 104 (H) 09/25/2018   TRIG 135.0 09/25/2018   CHOLHDL 3 09/25/2018    Hepatic Function Latest Ref Rng & Units 05/31/2020 09/25/2018 04/03/2018  Total Protein 6.0 - 8.3 g/dL 6.2 6.9 7.3  Albumin 3.5 - 5.2 g/dL 4.1 4.7 4.6  AST 0 - 37 U/L _0 ALT 0 - 53 U/L 23 16 20  Alk Phosphatase 39 - 117 U/L 70 69 69  Total Bilirubin 0.2 - 1.2 mg/dL 0.9 1.1 0.9  Bilirubin, Direct 0.0 - 0.3 mg/dL - 0.2 0.2    Lab Results  Component Value Date/Time   TSH 1.27 05/31/2020 12:08 PM   TSH 1.37 09/25/2018 10:01 AM    CBC Latest Ref Rng & Units 05/31/2020 09/25/2018 04/03/2018  WBC 4.0 - 10.5 K/uL 8.5 6.0 5.5  Hemoglobin 13.0 - 17.0 g/dL 15.1 13.7 14.3  Hematocrit 39.0 - 52.0 % 44.9 41.6 42.8  Platelets 150.0 - 400.0 K/uL 226.0 217.0 249.0    Lab Results  Component Value  Date/Time   VD25OH 31 06/02/2013 03:02 PM    Clinical ASCVD: No  The 10-year ASCVD risk score (Arnett DK, et al., 2019) is: 12.2%   Values used to calculate the score:     Age: 70 years     Sex: Male     Is Non-Hispanic African American: No     Diabetic: No     Tobacco smoker: No     Systolic Blood Pressure: 557 mmHg     Is BP treated: No     HDL Cholesterol: 82.4 mg/dL     Total Cholesterol: 213 mg/dL    Depression screen [facility] 2/9 12/02/2020 06/18/2020 10/10/2016  Decreased Interest 0 0 1  Down, Depressed, Hopeless 0 1 2  PHQ - 2 Score 0 1 3  Altered sleeping - 1 2  Tired, decreased energy - 0 2  Change in appetite - 1 1  Feeling bad or failure about yourself  - 0 2  Trouble concentrating - 2 3  Moving slowly or fidgety/restless - 0 0  Suicidal thoughts - 0 0  PHQ-9 Score - 5 13   CAT score - completed 06/18/2020 - 13   Social History   Tobacco Use  Smoking Status Former   Packs/day: 1.00   Years: 19.00   Pack years: 19.00   Types: Cigarettes   Quit date: 12/24/2020   Years since quitting: 0.0  Smokeless Tobacco Former   BP Readings from Last 3 Encounters:  12/02/20 118/70  12/02/20 118/70  05/31/20 130/72   Pulse Readings from Last 3 Encounters:  12/02/20 71  12/02/20 71  05/31/20 74   Wt Readings from Last 3 Encounters:  12/02/20 141 lb (64 kg)  12/02/20 141 lb (64 kg)  05/31/20 145 lb 3.2 oz (65.9 kg)   BMI Readings from Last 3 Encounters:  12/02/20 22.08 kg/m  12/02/20 22.08 kg/m  05/31/20 22.74 kg/m    Assessment/Interventions: Review of patient past medical history, allergies, medications, health status, including review of consultants reports, laboratory and other test data, was performed as part of comprehensive evaluation and provision of chronic care management services.   SDOH:  (Social Determinants of Health) assessments and interventions performed: Yes   SDOH Screenings   Alcohol Screen: Low Risk    Last Alcohol Screening Score  (AUDIT): 4  Depression (PHQ2-9): Low Risk    PHQ-2 Score: 0  Financial Resource Strain: Low Risk    Difficulty of Paying Living Expenses: Not hard at all  Food Insecurity: No Food Insecurity   Worried About Charity fundraiser in the Last Year: Never true   Ran Out of Food in the Last Year: Never true  Housing: Low Risk    Last Housing Risk Score: 0  Physical Activity: Sufficiently Active   Days of Exercise per Week: 5 days   Minutes of Exercise per Session:  30 min  Social Connections: Moderately Integrated   Frequency of Communication with Friends and Family: More than three times a week   Frequency of Social Gatherings with Friends and Family: More than three times a week   Attends Religious Services: More than 4 times per year   Active Member of [employer] or Organizations: Yes   Attends Music therapist: More than 4 times per year   Marital Status: Separated  Stress: No Stress Concern Present   Feeling of Stress : Not at all  Tobacco Use: Medium Risk   Smoking Tobacco Use: Former   Smokeless Tobacco Use: Former   Passive Exposure: Not on Pensions consultant Needs: No Transportation Needs   Film/video editor (Medical): No   Lack of Transportation (Non-Medical): No    CCM Care Plan  Allergies  Allergen Reactions   Penicillins Itching and Swelling   Sulfonamide Derivatives Itching and Swelling   Varenicline Tartrate Other (See Comments)    states that he had nightmares and felt crazy    Tramadol Other (See Comments)    dizzy    Medications Reviewed Today     Reviewed by Tomasa Blase, [facility] (Pharmacist) on 01/07/21 at 1010  Med List Status: <None>   Medication Order Taking? Sig Documenting Provider Last Dose Status Informant  albuterol (PROAIR HFA) 108 (90 Base) MCG/ACT inhaler [id number] Yes Inhale 2 puffs into the lungs every 4 (four) hours as needed for wheezing or shortness of breath. Plotnikov, Evie Lacks, MD Taking Active   ALPRAZolam Duanne Moron) 0.5  MG tablet [id number] Yes Take 1 tablet (0.5 mg total) by mouth 2 (two) times daily as needed. for anxiety Plotnikov, Evie Lacks, MD Taking Active   B Complex Vitamins (B COMPLEX VITAMIN PO) [id number] Yes Take 1 tablet by mouth. [provider] Taking Active   Cholecalciferol (VITAMIN D) 2000 units CAPS [id number] Yes 1 po qd Plotnikov, Evie Lacks, MD Taking Active   Cyanocobalamin (B-12 PO) [id number] Yes Take by mouth. [provider] Taking Active   cyclobenzaprine (FLEXERIL) 5 MG tablet [id number] Yes TAKE 1 TABLET BY MOUTH TWICE DAILY AS NEEDED FOR MUSCLE SPASM  Patient taking differently: Take 5 mg by mouth. TAKE 1 TABLET BY MOUTH TWICE DAILY AS NEEDED FOR MUSCLE SPASM   Plotnikov, Evie Lacks, MD Taking Active            Med Note Delice Bison, Darnelle Maffucci   Fri Jun 18, 2020  2:04 PM) Usually only taking at bedtime, at times will take an additional time in the day    donepezil (ARICEPT) 5 MG tablet [id number] No Take 1 tablet (5 mg total) by mouth at bedtime.  Patient not taking: Reported on 01/07/2021   Plotnikov, Evie Lacks, MD Not Taking Active   fexofenadine (ALLEGRA) 180 MG tablet [id number] Yes Take 180 mg by mouth daily as needed. [provider] Taking Active            Med Note Delice Bison, Darnelle Maffucci   Fri Jun 18, 2020  2:05 PM) Takes if he does not take the allegraD   fexofenadine-pseudoephedrine (ALLEGRA-D ALLERGY & CONGESTION) 180-240 MG 24 hr tablet [id number] Yes Take 1 tablet by mouth daily as needed. Plotnikov, Evie Lacks, MD Taking Active            Med Note Delice Bison, Darnelle Maffucci   Fri Jun 18, 2020  2:05 PM) Takes 3 times per week  hydrOXYzine (ATARAX/VISTARIL) 25 MG tablet [id number] Yes Take 1 tablet (25 mg  total) by mouth every 8 (eight) hours as needed for itching. Plotnikov, Evie Lacks, MD Taking Active   hyoscyamine (LEVSIN, ANASPAZ) 0.125 MG tablet [id number]  TAKE ONE TABLET BY MOUTH EVERY 6 HOURS AS NEEDED  Patient taking differently: Take 0.125 mg by mouth every 6 (six)  hours as needed.   Plotnikov, Evie Lacks, MD  Active   magnesium gluconate (MAGONATE) 500 MG tablet [id number] Yes Take 250 mg by mouth daily. Uses typically once weekly - helps with his sleep [provider] Taking Active   nortriptyline (PAMELOR) 10 MG capsule [id number] Yes TAKE 1 TO 2 CAPSULES BY MOUTH ONCE DAILY AT BEDTIME Plotnikov, Evie Lacks, MD Taking Active   sildenafil (VIAGRA) 100 MG tablet [id number] Yes TAKE 1 TABLET BY MOUTH  DAILY AS NEEDED FOR  ERECTILE DYSFUNCTION Plotnikov, Evie Lacks, MD Taking Active   tamsulosin (FLOMAX) 0.4 MG CAPS capsule [id number] Yes Take 1 capsule (0.4 mg total) by mouth daily. Plotnikov, Evie Lacks, MD Taking Active   Turmeric (QC TUMERIC COMPLEX PO) [id number] Yes Take by mouth. [provider] Taking Active   umeclidinium bromide (INCRUSE ELLIPTA) 62.5 MCG/INH AEPB [id number] Yes Inhale 1 puff into the lungs daily. Plotnikov, Evie Lacks, MD Taking Active   vitamin C (ASCORBIC ACID) 500 MG tablet [id number] Yes Take 500 mg by mouth daily. 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   Acute upper respiratory infection 04/07/2014   Elevated PSA 08/12/2013   Well adult exam 06/02/2013   Mouth sore 06/02/2013   Irritable bowel syndrome (IBS) 06/02/2013   Lumbar radiculopathy, chronic 05/09/2013   Piriformis syndrome of left side 04/17/2013   PREMATURE EJACULATION  03/11/2007   ERECTILE DYSFUNCTION 03/11/2007   Generalized anxiety disorder 09/20/2006   SMOKER 09/20/2006   ALLERGIC RHINITIS 09/20/2006   COPD (chronic obstructive pulmonary disease) ([place]) 09/20/2006   PROSTATITIS, ACUTE 09/20/2006   ANKLE PAIN, RIGHT 09/20/2006   COLONIC POLYPS, HX OF 09/20/2006    Immunization History  Administered Date(s) Administered   Hepatitis B, adult 12/18/2013, 01/28/2014, 06/10/2014   Influenza Whole 02/27/2005   Tdap 01/05/2015    Conditions to be addressed/monitored:  COPD, Anxiety, BPH, Allergic Rhinitis and Chronic pain   Care Plan : CCM Care Plan  Updates made by Tomasa Blase, RPH since 01/07/2021 12:00 AM     Problem: COPD, Depression, Smoking Cessation, Chronic Pain, BPH   Priority: High  Onset Date: 06/18/2020     Long-Range Goal: Disease Management   Start Date: 06/18/2020  Expected End Date: 01/07/2022  This Visit's Progress: On track  Recent Progress: On track  Priority: High  Note:    Current Barriers:  Unable to independently monitor therapeutic efficacy Does not adhere to prescribed medication regimen  Pharmacist Clinical Goal(s):  Patient will verbalize ability to afford treatment regimen achieve adherence to monitoring guidelines and medication adherence to achieve therapeutic efficacy  achieve control of COPD as evidenced by less frequent use of rescue inhaler contact provider office for questions/concerns as evidenced notation of same in electronic health record through collaboration with PharmD and provider.   Interventions: 1:1 collaboration with Plotnikov, Evie Lacks, MD regarding development and update of comprehensive plan of care as evidenced by provider attestation and co-signature Inter-disciplinary care team collaboration (see longitudinal plan of care) Comprehensive medication review performed; medication list updated in electronic medical record  COPD (Goal: control symptoms and prevent  exacerbations) -Controlled - improved  -Current treatment  -Incruse ellpita 62.64mg -1 puff daily  -Ventolin HFA - 2 puffs q4h prn  -Medications previously tried: n/a  -Gold Grade: could not determine, spirometry has not yet been completed  -Current COPD Classification:   Could not complete as spirometry has not yet been completed  -MMRC/CAT score: CAT score = 13 -Pulmonary function testing: not yet completed  -Exacerbations requiring treatment in last 6 months: n/a -Patient denies consistent use of maintenance inhaler -Frequency of rescue inhaler use: reports that he has not had to use recently  -Counseled on Proper inhaler technique; Benefits of consistent maintenance inhaler use When to use rescue inhaler Differences between maintenance and rescue inhalers -Recommending for patient to continue current medications  Tobacco use (Goal Smoking Cessation) -Controlled - patient reports that he stopped smoking 2 weeks ago  -Previous quit attempts: patient had quit smoking in 1990 - started smoking again in 2001 -Current treatment  N/a -Patient smokes Within 30 minutes of waking -Patient triggers include: stress and drinking coffee -On a scale of 1-10, reports MOTIVATION to quit is 10 -On a scale of 1-10, reports CONFIDENCE in quitting is 10 -Counseled on patch placement, side effects, and option to remove at night if they experience trouble sleeping or bad dreams.  Counseled on park & chew method for NRT gum. Provided contact information for [place] [facility]uit [facility] (1-800-QUIT-NOW) and encouraged patient to reach out to this group for support. -Recommended for patient to continue with smoking cessation - patient to reach out to clinic should additional resources be needed to continue cessation    Depression/Anxiety (Goal: prevention of anxiety attacks) -Controlled -Current treatment: Alprazolam 0.533m- 1 tablet BID prn  Elavil 1048m 1-2 tablets HS  -Medications previously tried/failed:  n/a -PHQ9: 5 -GAD7: 5 -Educated on Benefits of medication for symptom control -Recommended to continue current medication  Chronic Back Pain / spasms (Goal: pain control / prevention of exacerbations) -Controlled -Current treatment  Flexeril 5mg61mD - typically only taking once daily HS  -Medications previously tried: Tramadol, Norco, Hysingla ER, Voltaren, Naproxen   -Recommended to continue current medication  BPH (Goal: Maintain control / prevention progression) -Controlled -Current treatment  Tamsulosin 0.4mg 66mly  -Medications previously tried: n/a  -Recommended to continue current medication   Allergic Rhinitis (Goal: Prevention of flares / treatment plan) -Controlled -Current treatment  Hydroxyzine 25mg 60mtablet every 8 hours as needed for itching Allegra D 180-240mg -109mablet daily   - Taking 3 times weekly Allegra 180mg - 32mblet daily - takes on days that he does not take allegra D -Medications previously tried: n/a  -Recommended to continue current medication   Health Maintenance -Current therapy:  Viagra 100mg - 124mlet daily prn Hyoscyamine 0.125mg - 1 75met q6h prn - for acid reflux if needed   Vitamin D 2000 units daily  Magnesium 250mg - 1 t46mt daily - usually takes 1 day weekly (for sleep) Vitamin C 500mg daily63m  Vitamin B complex - 1 tablet daily  Vitamin B12 -  1 tablet daily   Turmeric - 1 tablet daily  -Educated on Herbal supplement research is limited and benefits usually cannot be proven Cost vs benefit of each product must be carefully weighed by individual consumer -Patient is satisfied with current therapy and denies issues -Recommended to continue current medication   Patient Goals/Self-Care Activities Patient will:  - take medications as prescribed focus on medication adherence by using maintenance inhaler daily as prescribed for COPD control       Medication Assistance:  none required at this time  Patient's preferred  pharmacy is:  [facility] ([place], [place] [address] [place] [address] Phone: [phone] Fax: [phone]   Uses pill box? No Pt endorses 75% compliance  Care Plan and Follow Up Patient Decision:  Patient agrees to Care Plan and Follow-up.  Plan: Telephone follow up appointment with care management team member scheduled for:  3 months  and The patient has been provided with contact information for the care management team and has been advised to call with any health related questions or concerns.    Tomasa Blase, PharmD Clinical Pharmacist, [place] screening examination/treatment/procedure(s) were performed by non-physician practitioner and as supervising physician I was immediately available for consultation/collaboration.  I agree with above. Lew Dawes, MD

## 2021-01-07 NOTE — Patient Instructions (Signed)
Visit Information  Following are the goals we discussed today:   Manage My Medications   Timeframe:  Long-Range Goal Priority:  High Start Date:   06/18/2020                          Expected End Date:  01/20/2022                  Follow Up Date 03/2021   - begin a symptom diary - eliminate symptom triggers at home - follow rescue plan if symptoms flare-up - keep follow-up appointments  - Use maintenance inhaler daily as prescribed   Why is this important?   Tracking your symptoms and other information about your health helps your doctor plan your care.  Write down the symptoms, the time of day, what you were doing and what medicine you are taking.  You will soon learn how to manage your symptoms.     Notes: Patient to reach out should he have any questions / issues with medications  Plan: Telephone follow up appointment with care management team member scheduled for:  3 months The patient has been provided with contact information for the care management team and has been advised to call with any health related questions or concerns.   Tomasa Blase, PharmD Clinical Pharmacist, [facility]   Please call the care guide team at [phone] if you need to cancel or reschedule your appointment.   Patient verbalizes understanding of instructions provided today and agrees to view in [place].

## 2021-02-21 ENCOUNTER — Ambulatory Visit (INDEPENDENT_AMBULATORY_CARE_PROVIDER_SITE_OTHER): Payer: Medicare Other | Admitting: Internal Medicine

## 2021-02-21 ENCOUNTER — Other Ambulatory Visit: Payer: Self-pay

## 2021-02-21 ENCOUNTER — Encounter: Payer: Self-pay | Admitting: Internal Medicine

## 2021-02-21 DIAGNOSIS — J441 Chronic obstructive pulmonary disease with (acute) exacerbation: Secondary | ICD-10-CM

## 2021-02-21 DIAGNOSIS — F17200 Nicotine dependence, unspecified, uncomplicated: Secondary | ICD-10-CM

## 2021-02-21 DIAGNOSIS — R972 Elevated prostate specific antigen [PSA]: Secondary | ICD-10-CM

## 2021-02-21 DIAGNOSIS — Z0000 Encounter for general adult medical examination without abnormal findings: Secondary | ICD-10-CM

## 2021-02-21 DIAGNOSIS — R413 Other amnesia: Secondary | ICD-10-CM

## 2021-02-21 LAB — PSA: PSA: 11.92 ng/mL — ABNORMAL HIGH (ref 0.10–4.00)

## 2021-02-21 MED ORDER — MEMANTINE HCL 5 MG PO TABS: 5.0000 mg | ORAL_TABLET | Freq: Two times a day (BID) | ORAL | 3 refills | Status: DC

## 2021-02-21 MED ORDER — SILDENAFIL CITRATE 100 MG PO TABS: ORAL_TABLET | ORAL | 3 refills | Status: DC

## 2021-02-21 NOTE — Assessment & Plan Note (Signed)
Pt quit smoking 10/22

## 2021-02-21 NOTE — Progress Notes (Signed)
Subjective:  Patient ID: Joseph Preston, male    DOB: 10/04/1950  Age: 71 y.o. MRN: [id number]  CC: Follow-up (3 month f/u)   HPI Joseph Preston presents for memory issues, anxiety, insomnia. C/o ED  Outpatient Medications Prior to Visit  Medication Sig Dispense Refill   albuterol (PROAIR HFA) 108 (90 Base) MCG/ACT inhaler Inhale 2 puffs into the lungs every 4 (four) hours as needed for wheezing or shortness of breath. 54 g 3   ALPRAZolam (XANAX) 0.5 MG tablet Take 1 tablet (0.5 mg total) by mouth 2 (two) times daily as needed. for anxiety 180 tablet 1   B Complex Vitamins (B COMPLEX VITAMIN PO) Take 1 tablet by mouth.     Cholecalciferol (VITAMIN D) 2000 units CAPS 1 po qd 100 capsule 3   Cyanocobalamin (B-12 PO) Take by mouth.     cyclobenzaprine (FLEXERIL) 5 MG tablet TAKE 1 TABLET BY MOUTH TWICE DAILY AS NEEDED FOR MUSCLE SPASM (Patient taking differently: Take 5 mg by mouth. TAKE 1 TABLET BY MOUTH TWICE DAILY AS NEEDED FOR MUSCLE SPASM) 180 tablet 0   fexofenadine-pseudoephedrine (ALLEGRA-D ALLERGY & CONGESTION) 180-240 MG 24 hr tablet Take 1 tablet by mouth daily as needed. 90 tablet 1   hydrOXYzine (ATARAX/VISTARIL) 25 MG tablet Take 1 tablet (25 mg total) by mouth every 8 (eight) hours as needed for itching. 60 tablet 0   hyoscyamine (LEVSIN, ANASPAZ) 0.125 MG tablet TAKE ONE TABLET BY MOUTH EVERY 6 HOURS AS NEEDED (Patient taking differently: Take 0.125 mg by mouth every 6 (six) hours as needed.) 100 tablet 1   magnesium gluconate (MAGONATE) 500 MG tablet Take 250 mg by mouth daily. Uses typically once weekly - helps with his sleep     nortriptyline (PAMELOR) 10 MG capsule TAKE 1 TO 2 CAPSULES BY MOUTH ONCE DAILY AT BEDTIME 180 capsule 3   tamsulosin (FLOMAX) 0.4 MG CAPS capsule Take 1 capsule (0.4 mg total) by mouth daily. 90 capsule 3   Turmeric (QC TUMERIC COMPLEX PO) Take by mouth.     umeclidinium bromide (INCRUSE ELLIPTA) 62.5 MCG/INH AEPB Inhale 1 puff into the lungs  daily. 3 each 3   vitamin C (ASCORBIC ACID) 500 MG tablet Take 500 mg by mouth daily.     sildenafil (VIAGRA) 100 MG tablet TAKE 1 TABLET BY MOUTH  DAILY AS NEEDED FOR  ERECTILE DYSFUNCTION 12 tablet 3   donepezil (ARICEPT) 5 MG tablet Take 1 tablet (5 mg total) by mouth at bedtime. (Patient not taking: Reported on 01/07/2021) 90 tablet 1   fexofenadine (ALLEGRA) 180 MG tablet Take 180 mg by mouth daily as needed. (Patient not taking: Reported on 02/21/2021)     No facility-administered medications prior to visit.    ROS: Review of Systems  Constitutional:  Negative for appetite change, fatigue and unexpected weight change.  HENT:  Negative for congestion, nosebleeds, sneezing, sore throat and trouble swallowing.   Eyes:  Negative for itching and visual disturbance.  Respiratory:  Negative for cough.   Cardiovascular:  Negative for chest pain, palpitations and leg swelling.  Gastrointestinal:  Negative for abdominal distention, blood in stool, diarrhea and nausea.  Genitourinary:  Negative for frequency and hematuria.  Musculoskeletal:  Negative for back pain, gait problem, joint swelling and neck pain.  Skin:  Negative for rash.  Neurological:  Negative for dizziness, tremors, speech difficulty and weakness.  Psychiatric/Behavioral:  Negative for agitation, dysphoric mood and sleep disturbance. The patient is nervous/anxious.    Objective:  BP 132/82 (BP Location: Left Arm)    Pulse 70    Temp 98.6 F (37 C) (Oral)    Ht 5\' 7"  (1.702 m)    Wt 146 lb (66.2 kg)    SpO2 97%    BMI 22.87 kg/m   BP Readings from Last 3 Encounters:  02/21/21 132/82  12/02/20 118/70  12/02/20 118/70    Wt Readings from Last 3 Encounters:  02/21/21 146 lb (66.2 kg)  12/02/20 141 lb (64 kg)  12/02/20 141 lb (64 kg)    Physical Exam Constitutional:      General: He is not in acute distress.    Appearance: He is well-developed.     Comments: NAD  Eyes:     Conjunctiva/sclera: Conjunctivae normal.      Pupils: Pupils are equal, round, and reactive to light.  Neck:     Thyroid: No thyromegaly.     Vascular: No JVD.  Cardiovascular:     Rate and Rhythm: Normal rate and regular rhythm.     Heart sounds: Normal heart sounds. No murmur heard.   No friction rub. No gallop.  Pulmonary:     Effort: Pulmonary effort is normal. No respiratory distress.     Breath sounds: Normal breath sounds. No wheezing or rales.  Chest:     Chest wall: No tenderness.  Abdominal:     General: Bowel sounds are normal. There is no distension.     Palpations: Abdomen is soft. There is no mass.     Tenderness: There is no abdominal tenderness. There is no guarding or rebound.  Musculoskeletal:        General: No tenderness. Normal range of motion.     Cervical back: Normal range of motion.  Lymphadenopathy:     Cervical: No cervical adenopathy.  Skin:    General: Skin is warm and dry.     Findings: No rash.  Neurological:     Mental Status: He is alert and oriented to person, place, and time.     Cranial Nerves: No cranial nerve deficit.     Motor: No abnormal muscle tone.     Coordination: Coordination normal.     Gait: Gait normal.     Deep Tendon Reflexes: Reflexes are normal and symmetric.  Psychiatric:        Behavior: Behavior normal.        Thought Content: Thought content normal.        Judgment: Judgment normal.    Lab Results  Component Value Date   WBC 8.5 05/31/2020   HGB 15.1 05/31/2020   HCT 44.9 05/31/2020   PLT 226.0 05/31/2020   GLUCOSE 83 05/31/2020   CHOL 213 (H) 09/25/2018   TRIG 135.0 09/25/2018   HDL 82.40 09/25/2018   LDLCALC 104 (H) 09/25/2018   ALT 23 05/31/2020   AST 22 05/31/2020   NA 139 05/31/2020   K 4.0 05/31/2020   CL 101 05/31/2020   CREATININE 0.91 05/31/2020   BUN 16 05/31/2020   CO2 28 05/31/2020   TSH 1.27 05/31/2020   PSA 13.19 (H) 05/31/2020   HGBA1C 5.7 03/10/2016    No results found.  Assessment & Plan:   Problem List Items Addressed  This Visit     COPD exacerbation ([place])    Pt quit smoking 10/22      Elevated PSA    Monitor      Memory problem    Problems w/Aricept at hs - d/c Start Namenda  SMOKER    Pt quit smoking 10/22      Well adult exam   Relevant Medications   sildenafil (VIAGRA) 100 MG tablet      Meds ordered this encounter  Medications   sildenafil (VIAGRA) 100 MG tablet    Sig: TAKE 1 TABLET BY MOUTH  DAILY AS NEEDED FOR  ERECTILE DYSFUNCTION    Dispense:  12 tablet    Refill:  3   memantine (NAMENDA) 5 MG tablet    Sig: Take 1 tablet (5 mg total) by mouth 2 (two) times daily.    Dispense:  180 tablet    Refill:  3      Follow-up: Return in about 3 months (around 05/22/2021) for a follow-up visit.  Walker Kehr, MD

## 2021-02-21 NOTE — Assessment & Plan Note (Addendum)
Problems w/Aricept at hs - d/c Start Namenda

## 2021-02-21 NOTE — Assessment & Plan Note (Signed)
Monitor

## 2021-02-21 NOTE — Patient Instructions (Signed)
[email or web address]

## 2021-03-07 ENCOUNTER — Ambulatory Visit: Payer: Medicare Other | Admitting: Internal Medicine

## 2021-03-14 ENCOUNTER — Telehealth: Payer: Self-pay

## 2021-03-14 NOTE — Chronic Care Management (AMB) (Signed)
Chronic Care Management Pharmacy Assistant   Name: Joseph Preston  MRN: [id number] DOB: [birth year]  Joseph Preston is an 71 y.o. year old male who presents for his follow-up CCM visit with the clinical pharmacist.  Reason for Encounter: Disease State   Conditions to be addressed/monitored: COPD   Recent office visits:  02/21/21 Plotnikov, Evie Lacks, MD-PCP (Well adult exam) Labs, Medication changes: Namenda 5 mg, Sildenafil 100 mg  Recent consult visits:  None ID  Hospital visits:  None in previous 6 months  Medications: Outpatient Encounter Medications as of 03/14/2021  Medication Sig Note   albuterol (PROAIR HFA) 108 (90 Base) MCG/ACT inhaler Inhale 2 puffs into the lungs every 4 (four) hours as needed for wheezing or shortness of breath.    ALPRAZolam (XANAX) 0.5 MG tablet Take 1 tablet (0.5 mg total) by mouth 2 (two) times daily as needed. for anxiety    B Complex Vitamins (B COMPLEX VITAMIN PO) Take 1 tablet by mouth.    Cholecalciferol (VITAMIN D) 2000 units CAPS 1 po qd    Cyanocobalamin (B-12 PO) Take by mouth.    cyclobenzaprine (FLEXERIL) 5 MG tablet TAKE 1 TABLET BY MOUTH TWICE DAILY AS NEEDED FOR MUSCLE SPASM (Patient taking differently: Take 5 mg by mouth. TAKE 1 TABLET BY MOUTH TWICE DAILY AS NEEDED FOR MUSCLE SPASM) 06/18/2020: Usually only taking at bedtime, at times will take an additional time in the day     fexofenadine-pseudoephedrine (ALLEGRA-D ALLERGY & CONGESTION) 180-240 MG 24 hr tablet Take 1 tablet by mouth daily as needed. 06/18/2020: Takes 3 times per week   hydrOXYzine (ATARAX/VISTARIL) 25 MG tablet Take 1 tablet (25 mg total) by mouth every 8 (eight) hours as needed for itching.    hyoscyamine (LEVSIN, ANASPAZ) 0.125 MG tablet TAKE ONE TABLET BY MOUTH EVERY 6 HOURS AS NEEDED (Patient taking differently: Take 0.125 mg by mouth every 6 (six) hours as needed.)    magnesium gluconate (MAGONATE) 500 MG tablet Take 250 mg by mouth daily. Uses typically  once weekly - helps with his sleep    memantine (NAMENDA) 5 MG tablet Take 1 tablet (5 mg total) by mouth 2 (two) times daily.    nortriptyline (PAMELOR) 10 MG capsule TAKE 1 TO 2 CAPSULES BY MOUTH ONCE DAILY AT BEDTIME    sildenafil (VIAGRA) 100 MG tablet TAKE 1 TABLET BY MOUTH  DAILY AS NEEDED FOR  ERECTILE DYSFUNCTION    tamsulosin (FLOMAX) 0.4 MG CAPS capsule Take 1 capsule (0.4 mg total) by mouth daily.    Turmeric (QC TUMERIC COMPLEX PO) Take by mouth.    umeclidinium bromide (INCRUSE ELLIPTA) 62.5 MCG/INH AEPB Inhale 1 puff into the lungs daily.    vitamin C (ASCORBIC ACID) 500 MG tablet Take 500 mg by mouth daily.    No facility-administered encounter medications on file as of 03/14/2021.   Current COPD regimen: albuterol inhale 2 puffs, incruse ellipta No flowsheet data found. Any recent hospitalizations or ED visits since last visit with CPP? No Reports denies COPD symptoms, including no symptoms What recent interventions/DTPs have been made by any provider to improve breathing since last visit:none  Have you had exacerbation/flare-up since last visit? No What do you do when you are short of breath?  Rest, patient states that he sits down to catch breath and if that does not work he will use rescue inhaler  Respiratory Devices/Equipment Do you have a nebulizer? No Do you use a Peak Flow Meter? No Do you  use a maintenance inhaler? Yes How often do you forget to use your daily inhaler? Patient states that he has not had to use everyday  Do you use a rescue inhaler? Yes How often do you use your rescue inhaler?  prn Do you use a spacer with your inhaler? No  Adherence Review: Does the patient have >5 day gap between last estimated fill date for maintenance inhaler medications? Yes   Care Gaps: Colonoscopy-12/06/05 Diabetic Foot Exam-NA Ophthalmology-NA Dexa Scan - NA Annual Well Visit - 02/21/21 Micro albumin-NA Hemoglobin A1c- 03/10/16  Star Rating Drugs: None  ID   Ethelene Hal Clinical Pharmacist Assistant [phone]

## 2021-03-21 ENCOUNTER — Other Ambulatory Visit: Payer: Self-pay | Admitting: Internal Medicine

## 2021-03-24 MED ORDER — CYCLOBENZAPRINE HCL 5 MG PO TABS: ORAL_TABLET | ORAL | 0 refills | Status: DC

## 2021-04-14 ENCOUNTER — Telehealth: Payer: Medicare Other

## 2021-06-02 ENCOUNTER — Ambulatory Visit (INDEPENDENT_AMBULATORY_CARE_PROVIDER_SITE_OTHER): Payer: Medicare Other | Admitting: Internal Medicine

## 2021-06-02 ENCOUNTER — Encounter: Payer: Self-pay | Admitting: Internal Medicine

## 2021-06-02 VITALS — BP 122/72 | HR 63 | Temp 97.7°F | Ht 67.0 in | Wt 150.6 lb

## 2021-06-02 DIAGNOSIS — R7309 Other abnormal glucose: Secondary | ICD-10-CM | POA: Diagnosis not present

## 2021-06-02 DIAGNOSIS — G4700 Insomnia, unspecified: Secondary | ICD-10-CM | POA: Diagnosis not present

## 2021-06-02 DIAGNOSIS — M545 Low back pain, unspecified: Secondary | ICD-10-CM | POA: Diagnosis not present

## 2021-06-02 DIAGNOSIS — J449 Chronic obstructive pulmonary disease, unspecified: Secondary | ICD-10-CM

## 2021-06-02 DIAGNOSIS — F411 Generalized anxiety disorder: Secondary | ICD-10-CM

## 2021-06-02 DIAGNOSIS — Z0000 Encounter for general adult medical examination without abnormal findings: Secondary | ICD-10-CM | POA: Diagnosis not present

## 2021-06-02 DIAGNOSIS — M79605 Pain in left leg: Secondary | ICD-10-CM | POA: Diagnosis not present

## 2021-06-02 NOTE — Assessment & Plan Note (Signed)
Cont w/Nortriptyline at hs ?

## 2021-06-02 NOTE — Assessment & Plan Note (Signed)
Flexeril prn  

## 2021-06-02 NOTE — Assessment & Plan Note (Signed)
Monitor A1c 

## 2021-06-02 NOTE — Assessment & Plan Note (Signed)
Albuterol MDI as needed ?1 ppd smoker discussed - ongoing ?Pt refused COVID and other vaccinations ?

## 2021-06-02 NOTE — Progress Notes (Signed)
Subjective:  Patient ID: Joseph Preston, male    DOB: 1950-11-21  Age: 71 y.o. MRN: [id number]  CC: Memory F/U (He mentioned that is memory is about the same, has not gotten any worse. )   HPI Joseph Preston presents for anxiety, BPH, COPD   Outpatient Medications Prior to Visit  Medication Sig Dispense Refill   albuterol (PROAIR HFA) 108 (90 Base) MCG/ACT inhaler Inhale 2 puffs into the lungs every 4 (four) hours as needed for wheezing or shortness of breath. 54 g 3   ALPRAZolam (XANAX) 0.5 MG tablet Take 1 tablet (0.5 mg total) by mouth 2 (two) times daily as needed. for anxiety 180 tablet 1   B Complex Vitamins (B COMPLEX VITAMIN PO) Take 1 tablet by mouth.     Cholecalciferol (VITAMIN D) 2000 units CAPS 1 po qd 100 capsule 3   Cyanocobalamin (B-12 PO) Take by mouth.     cyclobenzaprine (FLEXERIL) 5 MG tablet TAKE 1 TABLET BY MOUTH TWICE DAILY AS NEEDED FOR MUSCLE SPASM 180 tablet 0   fexofenadine-pseudoephedrine (ALLEGRA-D ALLERGY & CONGESTION) 180-240 MG 24 hr tablet Take 1 tablet by mouth daily as needed. 90 tablet 1   hydrOXYzine (ATARAX/VISTARIL) 25 MG tablet Take 1 tablet (25 mg total) by mouth every 8 (eight) hours as needed for itching. 60 tablet 0   hyoscyamine (LEVSIN, ANASPAZ) 0.125 MG tablet TAKE ONE TABLET BY MOUTH EVERY 6 HOURS AS NEEDED (Patient taking differently: Take 0.125 mg by mouth every 6 (six) hours as needed.) 100 tablet 1   magnesium gluconate (MAGONATE) 500 MG tablet Take 250 mg by mouth daily. Uses typically once weekly - helps with his sleep     memantine (NAMENDA) 5 MG tablet Take 1 tablet (5 mg total) by mouth 2 (two) times daily. 180 tablet 3   nortriptyline (PAMELOR) 10 MG capsule TAKE 1 TO 2 CAPSULES BY MOUTH ONCE DAILY AT BEDTIME 180 capsule 3   sildenafil (VIAGRA) 100 MG tablet TAKE 1 TABLET BY MOUTH  DAILY AS NEEDED FOR  ERECTILE DYSFUNCTION 12 tablet 3   tamsulosin (FLOMAX) 0.4 MG CAPS capsule Take 1 capsule (0.4 mg total) by mouth daily. 90  capsule 3   Turmeric (QC TUMERIC COMPLEX PO) Take by mouth.     umeclidinium bromide (INCRUSE ELLIPTA) 62.5 MCG/INH AEPB Inhale 1 puff into the lungs daily. 3 each 3   vitamin C (ASCORBIC ACID) 500 MG tablet Take 500 mg by mouth daily.     No facility-administered medications prior to visit.    ROS: Review of Systems  Constitutional:  Negative for appetite change, fatigue and unexpected weight change.  HENT:  Negative for congestion, nosebleeds, sneezing, sore throat and trouble swallowing.   Eyes:  Negative for itching and visual disturbance.  Respiratory:  Negative for cough.   Cardiovascular:  Negative for chest pain, palpitations and leg swelling.  Gastrointestinal:  Negative for abdominal distention, blood in stool, diarrhea and nausea.  Genitourinary:  Positive for frequency. Negative for difficulty urinating and hematuria.  Musculoskeletal:  Negative for back pain, gait problem, joint swelling and neck pain.  Skin:  Negative for rash.  Neurological:  Negative for dizziness, tremors, speech difficulty and weakness.  Psychiatric/Behavioral:  Negative for agitation, dysphoric mood, sleep disturbance and suicidal ideas. The patient is nervous/anxious.    Objective:  BP 122/72   Pulse 63   Temp 97.7 F (36.5 C) (Oral)   Ht '5\' 7"'$  (1.702 m)   Wt 150 lb 9.6 oz (68.3  kg)   SpO2 97%   BMI 23.59 kg/m   BP Readings from Last 3 Encounters:  06/02/21 122/72  02/21/21 132/82  12/02/20 118/70    Wt Readings from Last 3 Encounters:  06/02/21 150 lb 9.6 oz (68.3 kg)  02/21/21 146 lb (66.2 kg)  12/02/20 141 lb (64 kg)    Physical Exam Constitutional:      General: He is not in acute distress.    Appearance: He is well-developed.     Comments: NAD  Eyes:     Conjunctiva/sclera: Conjunctivae normal.     Pupils: Pupils are equal, round, and reactive to light.  Neck:     Thyroid: No thyromegaly.     Vascular: No JVD.  Cardiovascular:     Rate and Rhythm: Normal rate and  regular rhythm.     Heart sounds: Normal heart sounds. No murmur heard.   No friction rub. No gallop.  Pulmonary:     Effort: Pulmonary effort is normal. No respiratory distress.     Breath sounds: Normal breath sounds. No wheezing or rales.  Chest:     Chest wall: No tenderness.  Abdominal:     General: Bowel sounds are normal. There is no distension.     Palpations: Abdomen is soft. There is no mass.     Tenderness: There is no abdominal tenderness. There is no guarding or rebound.  Musculoskeletal:        General: No tenderness. Normal range of motion.     Cervical back: Normal range of motion.  Lymphadenopathy:     Cervical: No cervical adenopathy.  Skin:    General: Skin is warm and dry.     Findings: No rash.  Neurological:     Mental Status: He is alert and oriented to person, place, and time.     Cranial Nerves: No cranial nerve deficit.     Motor: No abnormal muscle tone.     Coordination: Coordination normal.     Gait: Gait normal.     Deep Tendon Reflexes: Reflexes are normal and symmetric.  Psychiatric:        Behavior: Behavior normal.        Thought Content: Thought content normal.        Judgment: Judgment normal.  LS w/pain  Lab Results  Component Value Date   WBC 8.5 05/31/2020   HGB 15.1 05/31/2020   HCT 44.9 05/31/2020   PLT 226.0 05/31/2020   GLUCOSE 83 05/31/2020   CHOL 213 (H) 09/25/2018   TRIG 135.0 09/25/2018   HDL 82.40 09/25/2018   LDLCALC 104 (H) 09/25/2018   ALT 23 05/31/2020   AST 22 05/31/2020   NA 139 05/31/2020   K 4.0 05/31/2020   CL 101 05/31/2020   CREATININE 0.91 05/31/2020   BUN 16 05/31/2020   CO2 28 05/31/2020   TSH 1.27 05/31/2020   PSA 11.92 (H) 02/21/2021   HGBA1C 5.7 03/10/2016    No results found.  Assessment & Plan:   Problem List Items Addressed This Visit     Well adult exam - Primary   Relevant Orders   TSH   Urinalysis   CBC with Differential/Platelet   Lipid panel   PSA   Comprehensive metabolic  panel   Testosterone   Generalized anxiety disorder   Relevant Orders   Testosterone   Low back pain radiating to left lower extremity    Flexeril prn      Relevant Orders   Testosterone  Insomnia    Cont w/Nortriptyline at hs      Relevant Orders   Testosterone   Elevated glucose    Monitor A1c       COPD (chronic obstructive pulmonary disease) (HCC)    Albuterol MDI as needed 1 ppd smoker discussed - ongoing Pt refused COVID and other vaccinations         No orders of the defined types were placed in this encounter.     Follow-up: Return in about 6 months (around 12/03/2021) for Wellness Exam.  Walker Kehr, MD

## 2021-07-19 ENCOUNTER — Telehealth (INDEPENDENT_AMBULATORY_CARE_PROVIDER_SITE_OTHER): Payer: Medicare Other | Admitting: Internal Medicine

## 2021-07-19 DIAGNOSIS — J069 Acute upper respiratory infection, unspecified: Secondary | ICD-10-CM

## 2021-07-19 DIAGNOSIS — H6983 Other specified disorders of Eustachian tube, bilateral: Secondary | ICD-10-CM

## 2021-07-19 DIAGNOSIS — J449 Chronic obstructive pulmonary disease, unspecified: Secondary | ICD-10-CM

## 2021-07-19 MED ORDER — DOXYCYCLINE HYCLATE 100 MG PO TABS: 100.0000 mg | ORAL_TABLET | Freq: Two times a day (BID) | ORAL | 0 refills | Status: DC

## 2021-07-19 NOTE — Patient Instructions (Signed)
Please take all new medication as prescribed - the antibiotic  You can also take Delsym OTC for cough, and/or Mucinex (or it's generic off brand) for congestion, and tylenol as needed for pain.

## 2021-07-21 ENCOUNTER — Encounter: Payer: Self-pay | Admitting: Internal Medicine

## 2021-07-21 DIAGNOSIS — H6983 Other specified disorders of Eustachian tube, bilateral: Secondary | ICD-10-CM | POA: Insufficient documentation

## 2021-07-21 NOTE — Assessment & Plan Note (Signed)
Mild to mod, for antibx cours - doxycycline,  to f/u any worsening symptoms or concerns

## 2021-07-21 NOTE — Assessment & Plan Note (Signed)
Also d/w pt regarding mucinex bid prn

## 2021-09-16 ENCOUNTER — Telehealth: Payer: Medicare Other

## 2021-11-24 ENCOUNTER — Telehealth: Payer: Self-pay | Admitting: Internal Medicine

## 2021-11-24 NOTE — Telephone Encounter (Signed)
No calls from Dr. Alain Marion.Marland KitchenJohny Chess

## 2021-11-24 NOTE — Telephone Encounter (Signed)
Pt says someone from our office called him and he missed the call. There is no documented phone call in the chart.  Next Appt: 12/06/21 Last Appt: 07/19/21 virtual Pt phone number [phone]

## 2021-12-05 ENCOUNTER — Telehealth: Payer: Self-pay

## 2021-12-05 ENCOUNTER — Ambulatory Visit (INDEPENDENT_AMBULATORY_CARE_PROVIDER_SITE_OTHER): Payer: Medicare Other

## 2021-12-05 VITALS — Ht 67.0 in

## 2021-12-05 DIAGNOSIS — Z0000 Encounter for general adult medical examination without abnormal findings: Secondary | ICD-10-CM | POA: Diagnosis not present

## 2021-12-05 DIAGNOSIS — Z1211 Encounter for screening for malignant neoplasm of colon: Secondary | ICD-10-CM

## 2021-12-05 NOTE — Telephone Encounter (Signed)
Spoke with patient; AWV-S was completed.

## 2021-12-05 NOTE — Progress Notes (Addendum)
Virtual Visit via Telephone Note  I connected with  Joseph Preston on 12/05/21 at 10:30 AM EST by telephone and verified that I am speaking with the correct person using two identifiers.  Location: Patient: Home Provider: [place] Persons participating in the virtual visit: [place]   I discussed the limitations, risks, security and privacy concerns of performing an evaluation and management service by telephone and the availability of in person appointments. The patient expressed understanding and agreed to proceed.  Interactive audio and video telecommunications were attempted between this nurse and patient, however failed, due to patient having technical difficulties OR patient did not have access to video capability.  We continued and completed visit with audio only.  Some vital signs may be absent or patient reported.   Sheral Flow, LPN  Subjective:   Joseph Preston is a 71 y.o. male who presents for Medicare Annual/Subsequent preventive examination.  Review of Systems     Cardiac Risk Factors include: advanced age (>42mn, >>34women);male gender     Objective:    Today's Vitals   12/05/21 1131  Height: '5\' 7"'$  (1.702 m)  PainSc: 0-No pain   Body mass index is 23.59 kg/m.     12/05/2021   11:29 AM 12/05/2021   10:50 AM 12/02/2020   11:05 AM 01/31/2020    9:10 PM  Advanced Directives  Does Patient Have a Medical Advance Directive? No No No No  Would patient like information on creating a medical advance directive? Yes (MAU/Ambulatory/Procedural Areas - Information given) No - Patient declined No - Patient declined No - Patient declined    Current Medications (verified) Outpatient Encounter Medications as of 12/05/2021  Medication Sig   albuterol (PROAIR HFA) 108 (90 Base) MCG/ACT inhaler Inhale 2 puffs into the lungs every 4 (four) hours as needed for wheezing or shortness of breath.   ALPRAZolam (XANAX) 0.5 MG tablet Take 1  tablet (0.5 mg total) by mouth 2 (two) times daily as needed. for anxiety   B Complex Vitamins (B COMPLEX VITAMIN PO) Take 1 tablet by mouth.   Cholecalciferol (VITAMIN D) 2000 units CAPS 1 po qd   Cyanocobalamin (B-12 PO) Take by mouth.   cyclobenzaprine (FLEXERIL) 5 MG tablet TAKE 1 TABLET BY MOUTH TWICE DAILY AS NEEDED FOR MUSCLE SPASM   doxycycline (VIBRA-TABS) 100 MG tablet Take 1 tablet (100 mg total) by mouth 2 (two) times daily.   fexofenadine-pseudoephedrine (ALLEGRA-D ALLERGY & CONGESTION) 180-240 MG 24 hr tablet Take 1 tablet by mouth daily as needed.   hydrOXYzine (ATARAX/VISTARIL) 25 MG tablet Take 1 tablet (25 mg total) by mouth every 8 (eight) hours as needed for itching.   hyoscyamine (LEVSIN, ANASPAZ) 0.125 MG tablet TAKE ONE TABLET BY MOUTH EVERY 6 HOURS AS NEEDED (Patient taking differently: Take 0.125 mg by mouth every 6 (six) hours as needed.)   magnesium gluconate (MAGONATE) 500 MG tablet Take 250 mg by mouth daily. Uses typically once weekly - helps with his sleep   memantine (NAMENDA) 5 MG tablet Take 1 tablet (5 mg total) by mouth 2 (two) times daily.   nortriptyline (PAMELOR) 10 MG capsule TAKE 1 TO 2 CAPSULES BY MOUTH ONCE DAILY AT BEDTIME   sildenafil (VIAGRA) 100 MG tablet TAKE 1 TABLET BY MOUTH  DAILY AS NEEDED FOR  ERECTILE DYSFUNCTION   tamsulosin (FLOMAX) 0.4 MG CAPS capsule Take 1 capsule (0.4 mg total) by mouth daily.   Turmeric (QC TUMERIC COMPLEX PO) Take by mouth.  umeclidinium bromide (INCRUSE ELLIPTA) 62.5 MCG/INH AEPB Inhale 1 puff into the lungs daily.   vitamin C (ASCORBIC ACID) 500 MG tablet Take 500 mg by mouth daily.   No facility-administered encounter medications on file as of 12/05/2021.    Allergies (verified) Penicillins, Sulfonamide derivatives, Varenicline tartrate, Aricept [donepezil], and Tramadol   History: Past Medical History:  Diagnosis Date   Anxiety    Arthritis    Back pain, chronic    Past Surgical History:  Procedure  Laterality Date   COLONOSCOPY W/ POLYPECTOMY     LEG SURGERY     Family History  Problem Relation Age of Onset   Cancer Mother 31       lung ca   Cancer Father 35       glioblastoma   Social History   Socioeconomic History   Marital status: Legally Separated    Spouse name: Not on file   Number of children: Not on file   Years of education: Not on file   Highest education level: Not on file  Occupational History   Not on file  Tobacco Use   Smoking status: Former    Packs/day: 1.00    Years: 19.00    Total pack years: 19.00    Types: Cigarettes    Quit date: 12/24/2020    Years since quitting: 0.9   Smokeless tobacco: Former  Substance and Sexual Activity   Alcohol use: No   Drug use: No   Sexual activity: Yes  Other Topics Concern   Not on file  Social History Narrative   Not on file   Social Determinants of Health   Financial Resource Strain: Low Risk  (12/05/2021)   Overall Financial Resource Strain (CARDIA)    Difficulty of Paying Living Expenses: Not hard at all  Food Insecurity: No Food Insecurity (12/05/2021)   Hunger Vital Sign    Worried About Running Out of Food in the Last Year: Never true    [place] in the Last Year: Never true  Transportation Needs: No Transportation Needs (12/05/2021)   PRAPARE - Hydrologist (Medical): No    Lack of Transportation (Non-Medical): No  Physical Activity: Sufficiently Active (12/05/2021)   Exercise Vital Sign    Days of Exercise per Week: 5 days    Minutes of Exercise per Session: 30 min  Stress: No Stress Concern Present (12/05/2021)   [place]    Feeling of Stress : Not at all  Social Connections: Moderately Integrated (12/05/2021)   Social Connection and Isolation Panel [NHANES]    Frequency of Communication with Friends and Family: More than three times a week    Frequency of Social Gatherings with Friends and  Family: More than three times a week    Attends Religious Services: More than 4 times per year    Active Member of [employer] or Organizations: Yes    Attends Music therapist: More than 4 times per year    Marital Status: Separated    Tobacco Counseling Counseling given: Not Answered   Clinical Intake:  Pre-visit preparation completed: Yes  Pain : No/denies pain Pain Score: 0-No pain     BMI - recorded: 23.59 (06/02/2021) Nutritional Risks: None Diabetes: No  How often do you need to have someone help you when you read instructions, pamphlets, or other written materials from your doctor or pharmacy?: 1 - Never What is the last grade  level you completed in school?: HSG  Diabetic? no  Interpreter Needed?: No  Information entered by :: Lisette Abu, LPN.   Activities of Daily Living    12/05/2021   10:57 AM  In your present state of health, do you have any difficulty performing the following activities:  Hearing? 1  Vision? 0  Difficulty concentrating or making decisions? 0  Walking or climbing stairs? 0  Dressing or bathing? 0  Doing errands, shopping? 0  Preparing Food and eating ? N  Using the Toilet? N  In the past six months, have you accidently leaked urine? N  Do you have problems with loss of bowel control? N  Managing your Medications? N  Managing your Finances? N  Housekeeping or managing your Housekeeping? N    Patient Care Team: Plotnikov, Evie Lacks, MD as PCP - General (Internal Medicine) Szabat, Darnelle Maffucci, [facility] (Inactive) (Pharmacist) Szabat, Darnelle Maffucci, [facility] (Inactive) as Pharmacist (Pharmacist) Monna Fam, MD as Consulting Physician (Ophthalmology) Sheral Flow, LPN as Licensed Practical Nurse (Internal Medicine)  Indicate any recent Medical Services you may have received from other than [facility] providers in the past year (date may be approximate).     Assessment:   This is a routine wellness examination for  [employer].  Hearing/Vision screen Hearing Screening - Comments:: Patient has tinnitus. No hearing aids. Vision Screening - Comments:: Wears rx glasses - up to date with routine eye exams with Monna Fam, MD.   Dietary issues and exercise activities discussed:     Goals Addressed   None   Depression Screen    12/05/2021   10:52 AM 12/02/2020   11:07 AM 06/18/2020    2:35 PM 10/10/2016    2:11 PM  PHQ 2/9 Scores  PHQ - 2 Score 0 0 1 3  PHQ- 9 Score   5 13    Fall Risk    12/05/2021   10:51 AM 12/02/2020   11:07 AM 10/10/2016    2:11 PM  Fall Risk   Falls in the past year? 0 0 No  Number falls in past yr: 0 0   Injury with Fall? 0 0   Risk for fall due to : No Fall Risks No Fall Risks   Follow up Falls prevention discussed Falls evaluation completed     FALL RISK PREVENTION PERTAINING TO THE HOME:  Any stairs in or around the home? Yes  If so, are there any without handrails? No  Home free of loose throw rugs in walkways, pet beds, electrical cords, etc? Yes  Adequate lighting in your home to reduce risk of falls? Yes   ASSISTIVE DEVICES UTILIZED TO PREVENT FALLS:  Life alert? No  Use of a cane, walker or w/c? No  Grab bars in the bathroom? No  Shower chair or bench in shower? Yes  Elevated toilet seat or a handicapped toilet? No   TIMED UP AND GO:  Was the test performed? No . Phone Visit  Cognitive Function:        12/05/2021   10:58 AM  6CIT Screen  What Year? 0 points  What month? 0 points  What time? 0 points  Count back from 20 0 points  Months in reverse 0 points  Repeat phrase 0 points  Total Score 0 points    Immunizations Immunization History  Administered Date(s) Administered   Hepatitis B, adult 12/18/2013, 01/28/2014, 06/10/2014   Influenza Whole 02/27/2005   Tdap 01/05/2015    TDAP status: Up to date  Flu Vaccine status: Declined, Education has been provided regarding the importance of this vaccine but patient still declined.  Advised may receive this vaccine at local pharmacy or Health Dept. Aware to provide a copy of the vaccination record if obtained from local pharmacy or Health Dept. Verbalized acceptance and understanding.  Pneumococcal vaccine status: Declined,  Education has been provided regarding the importance of this vaccine but patient still declined. Advised may receive this vaccine at local pharmacy or Health Dept. Aware to provide a copy of the vaccination record if obtained from local pharmacy or Health Dept. Verbalized acceptance and understanding.   Covid-19 vaccine status: Declined, Education has been provided regarding the importance of this vaccine but patient still declined. Advised may receive this vaccine at local pharmacy or Health Dept.or vaccine clinic. Aware to provide a copy of the vaccination record if obtained from local pharmacy or Health Dept. Verbalized acceptance and understanding.  Qualifies for Shingles Vaccine? Yes   Zostavax completed No   Shingrix Completed?: No.    Education has been provided regarding the importance of this vaccine. Patient has been advised to call insurance company to determine out of pocket expense if they have not yet received this vaccine. Advised may also receive vaccine at local pharmacy or Health Dept. Verbalized acceptance and understanding.  Screening Tests Health Maintenance  Topic Date Due   Zoster Vaccines- Shingrix (1 of 2) Never done   COLONOSCOPY (Pts 45-81yr Insurance coverage will need to be confirmed)  12/27/2015   Pneumonia Vaccine 70 Years old (1 - PCV) Never done   COVID-19 Vaccine (1) 06/17/2022 (Originally 04/20/1955)   Medicare Annual Wellness (AWV)  12/06/2022   TETANUS/TDAP  01/04/2025   Hepatitis C Screening  Completed   HPV VACCINES  Aged Out   INFLUENZA VACCINE  Discontinued    Health Maintenance  Health Maintenance Due  Topic Date Due   Zoster Vaccines- Shingrix (1 of 2) Never done   COLONOSCOPY (Pts 45-444yrInsurance  coverage will need to be confirmed)  12/27/2015   Pneumonia Vaccine 6543Years old (1 - PCV) Never done    Colorectal cancer screening: Referral to GI placed 12/05/2021. Pt aware the office will call re: appt.  Lung Cancer Screening: (Low Dose CT Chest recommended if Age 70-80ears, 30 pack-year currently smoking OR have quit w/in 15years.) does not qualify.   Lung Cancer Screening Referral: no  Additional Screening:  Hepatitis C Screening: does qualify; Completed 08/18/2015  Vision Screening: Recommended annual ophthalmology exams for early detection of glaucoma and other disorders of the eye. Is the patient up to date with their annual eye exam?  Yes  Who is the provider or what is the name of the office in which the patient attends annual [facility] exams[facility]. If pt is not established with a provider, would they like to be referred to a provider to establish care? No .   Dental Screening: Recommended annual dental exams for proper oral hygiene  Community Resource Referral / Chronic Care Management: CRR required this visit?  No   CCM required this visit?  No      Plan:     I have personally reviewed and noted the following in the patient's chart:   Medical and social history Use of alcohol, tobacco or illicit drugs  Current medications and supplements including opioid prescriptions. Patient is not currently taking opioid prescriptions. Functional ability and status Nutritional status Physical activity Advanced directives List of other physicians Hospitalizations, surgeries, and ER  visits in previous 12 months Vitals Screenings to include cognitive, depression, and falls Referrals and appointments  In addition, I have reviewed and discussed with patient certain preventive protocols, quality metrics, and best practice recommendations. A written personalized care plan for preventive services as well as general preventive health recommendations were provided to  patient.     Sheral Flow, LPN   16/01/958   Nurse Notes: N/A  Medical screening examination/treatment/procedure(s) were performed by non-physician practitioner and as supervising physician I was immediately available for consultation/collaboration.  I agree with above. Lew Dawes, MD

## 2021-12-05 NOTE — Patient Instructions (Addendum)
[name] , Thank you for taking time to come for your Medicare Wellness Visit. I appreciate your ongoing commitment to your health goals. Please review the following plan we discussed and let me know if I can assist you in the future.   These are the goals we discussed:  Goals      Track and Manage My Symptoms-COPD     Timeframe:  Long-Range Goal Priority:  High Start Date:   06/18/2020                          Expected End Date:  01/20/2022                  Follow Up Date 03/2021   - begin a symptom diary - eliminate symptom triggers at home - follow rescue plan if symptoms flare-up - keep follow-up appointments  - Use maintenance inhaler daily as prescribed   Why is this important?   Tracking your symptoms and other information about your health helps your doctor plan your care.  Write down the symptoms, the time of day, what you were doing and what medicine you are taking.  You will soon learn how to manage your symptoms.     Notes: Patient to reach out should he have any questions / issues with medications        This is a list of the screening recommended for you and due dates:  Health Maintenance  Topic Date Due   Zoster (Shingles) Vaccine (1 of 2) Never done   Colon Cancer Screening  12/27/2015   Pneumonia Vaccine (1 - PCV) Never done   COVID-19 Vaccine (1) 06/17/2022*   Medicare Annual Wellness Visit  12/06/2022   Tetanus Vaccine  01/04/2025   Hepatitis C Screening: USPSTF Recommendation to screen - Ages 18-79 yo.  Completed   HPV Vaccine  Aged Out   Flu Shot  Discontinued  *Topic was postponed. The date shown is not the original due date.    Advanced directives: Advance directive discussed with you today. I have provided a copy for you to complete at home and have notarized. Once this is complete please bring a copy in to our office so we can scan it into your chart.  Conditions/risks identified: Yes  Next appointment: Follow up in one year for your annual  wellness visit.   Preventive Care 47 Years and Older, Male  Preventive care refers to lifestyle choices and visits with your health care provider that can promote health and wellness. What does preventive care include? A yearly physical exam. This is also called an annual well check. Dental exams once or twice a year. Routine eye exams. Ask your health care provider how often you should have your eyes checked. Personal lifestyle choices, including: Daily care of your teeth and gums. Regular physical activity. Eating a healthy diet. Avoiding tobacco and drug use. Limiting alcohol use. Practicing safe sex. Taking low doses of aspirin every day. Taking vitamin and mineral supplements as recommended by your health care provider. What happens during an annual well check? The services and screenings done by your health care provider during your annual well check will depend on your age, overall health, lifestyle risk factors, and family history of disease. Counseling  Your health care provider may ask you questions about your: Alcohol use. Tobacco use. Drug use. Emotional well-being. Home and relationship well-being. Sexual activity. Eating habits. History of falls. Memory and ability to understand (cognition).  Work and work Statistician. Screening  You may have the following tests or measurements: Height, weight, and BMI. Blood pressure. Lipid and cholesterol levels. These may be checked every 5 years, or more frequently if you are over 19 years old. Skin check. Lung cancer screening. You may have this screening every year starting at age 64 if you have a 30-pack-year history of smoking and currently smoke or have quit within the past 15 years. Fecal occult blood test (FOBT) of the stool. You may have this test every year starting at age 80. Flexible sigmoidoscopy or colonoscopy. You may have a sigmoidoscopy every 5 years or a colonoscopy every 10 years starting at age 86. Prostate  cancer screening. Recommendations will vary depending on your family history and other risks. Hepatitis C blood test. Hepatitis B blood test. Sexually transmitted disease (STD) testing. Diabetes screening. This is done by checking your blood sugar (glucose) after you have not eaten for a while (fasting). You may have this done every 1-3 years. Abdominal aortic aneurysm (AAA) screening. You may need this if you are a current or former smoker. Osteoporosis. You may be screened starting at age 25 if you are at high risk. Talk with your health care provider about your test results, treatment options, and if necessary, the need for more tests. Vaccines  Your health care provider may recommend certain vaccines, such as: Influenza vaccine. This is recommended every year. Tetanus, diphtheria, and acellular pertussis (Tdap, Td) vaccine. You may need a Td booster every 10 years. Zoster vaccine. You may need this after age 16. Pneumococcal 13-valent conjugate (PCV13) vaccine. One dose is recommended after age 71. Pneumococcal polysaccharide (PPSV23) vaccine. One dose is recommended after age 48. Talk to your health care provider about which screenings and vaccines you need and how often you need them. This information is not intended to replace advice given to you by your health care provider. Make sure you discuss any questions you have with your health care provider. Document Released: 02/12/2015 Document Revised: 10/06/2015 Document Reviewed: 11/17/2014 Elsevier Interactive Patient Education  2017 [place] Prevention in the Home Falls can cause injuries. They can happen to people of all ages. There are many things you can do to make your home safe and to help prevent falls. What can I do on the outside of my home? Regularly fix the edges of walkways and driveways and fix any cracks. Remove anything that might make you trip as you walk through a door, such as a raised step or  threshold. Trim any bushes or trees on the path to your home. Use bright outdoor lighting. Clear any walking paths of anything that might make someone trip, such as rocks or tools. Regularly check to see if handrails are loose or broken. Make sure that both sides of any steps have handrails. Any raised decks and porches should have guardrails on the edges. Have any leaves, snow, or ice cleared regularly. Use sand or salt on walking paths during winter. Clean up any spills in your garage right away. This includes oil or grease spills. What can I do in the bathroom? Use night lights. Install grab bars by the toilet and in the tub and shower. Do not use towel bars as grab bars. Use non-skid mats or decals in the tub or shower. If you need to sit down in the shower, use a plastic, non-slip stool. Keep the floor dry. Clean up any water that spills on the floor as soon as  it happens. Remove soap buildup in the tub or shower regularly. Attach bath mats securely with double-sided non-slip rug tape. Do not have throw rugs and other things on the floor that can make you trip. What can I do in the bedroom? Use night lights. Make sure that you have a light by your bed that is easy to reach. Do not use any sheets or blankets that are too big for your bed. They should not hang down onto the floor. Have a firm chair that has side arms. You can use this for support while you get dressed. Do not have throw rugs and other things on the floor that can make you trip. What can I do in the kitchen? Clean up any spills right away. Avoid walking on wet floors. Keep items that you use a lot in easy-to-reach places. If you need to reach something above you, use a strong step stool that has a grab bar. Keep electrical cords out of the way. Do not use floor polish or wax that makes floors slippery. If you must use wax, use non-skid floor wax. Do not have throw rugs and other things on the floor that can make you  trip. What can I do with my stairs? Do not leave any items on the stairs. Make sure that there are handrails on both sides of the stairs and use them. Fix handrails that are broken or loose. Make sure that handrails are as long as the stairways. Check any carpeting to make sure that it is firmly attached to the stairs. Fix any carpet that is loose or worn. Avoid having throw rugs at the top or bottom of the stairs. If you do have throw rugs, attach them to the floor with carpet tape. Make sure that you have a light switch at the top of the stairs and the bottom of the stairs. If you do not have them, ask someone to add them for you. What else can I do to help prevent falls? Wear shoes that: Do not have high heels. Have rubber bottoms. Are comfortable and fit you well. Are closed at the toe. Do not wear sandals. If you use a stepladder: Make sure that it is fully opened. Do not climb a closed stepladder. Make sure that both sides of the stepladder are locked into place. Ask someone to hold it for you, if possible. Clearly mark and make sure that you can see: Any grab bars or handrails. First and last steps. Where the edge of each step is. Use tools that help you move around (mobility aids) if they are needed. These include: Canes. Walkers. Scooters. Crutches. Turn on the lights when you go into a dark area. Replace any light bulbs as soon as they burn out. Set up your furniture so you have a clear path. Avoid moving your furniture around. If any of your floors are uneven, fix them. If there are any pets around you, be aware of where they are. Review your medicines with your doctor. Some medicines can make you feel dizzy. This can increase your chance of falling. Ask your doctor what other things that you can do to help prevent falls. This information is not intended to replace advice given to you by your health care provider. Make sure you discuss any questions you have with your  health care provider. Document Released: 11/12/2008 Document Revised: 06/24/2015 Document Reviewed: 02/20/2014 Elsevier Interactive Patient Education  2017 [employer].

## 2021-12-06 ENCOUNTER — Ambulatory Visit (INDEPENDENT_AMBULATORY_CARE_PROVIDER_SITE_OTHER): Payer: Medicare Other | Admitting: Internal Medicine

## 2021-12-06 ENCOUNTER — Encounter: Payer: Self-pay | Admitting: Internal Medicine

## 2021-12-06 VITALS — BP 118/82 | HR 77 | Temp 98.8°F | Ht 67.0 in | Wt 151.0 lb

## 2021-12-06 DIAGNOSIS — Z0000 Encounter for general adult medical examination without abnormal findings: Secondary | ICD-10-CM

## 2021-12-06 DIAGNOSIS — M545 Low back pain, unspecified: Secondary | ICD-10-CM | POA: Diagnosis not present

## 2021-12-06 DIAGNOSIS — J3089 Other allergic rhinitis: Secondary | ICD-10-CM

## 2021-12-06 DIAGNOSIS — G4700 Insomnia, unspecified: Secondary | ICD-10-CM

## 2021-12-06 DIAGNOSIS — Z1211 Encounter for screening for malignant neoplasm of colon: Secondary | ICD-10-CM | POA: Diagnosis not present

## 2021-12-06 DIAGNOSIS — J441 Chronic obstructive pulmonary disease with (acute) exacerbation: Secondary | ICD-10-CM

## 2021-12-06 DIAGNOSIS — F411 Generalized anxiety disorder: Secondary | ICD-10-CM

## 2021-12-06 DIAGNOSIS — M79605 Pain in left leg: Secondary | ICD-10-CM

## 2021-12-06 DIAGNOSIS — Z125 Encounter for screening for malignant neoplasm of prostate: Secondary | ICD-10-CM | POA: Diagnosis not present

## 2021-12-06 DIAGNOSIS — Z136 Encounter for screening for cardiovascular disorders: Secondary | ICD-10-CM | POA: Diagnosis not present

## 2021-12-06 LAB — URINALYSIS
Bilirubin Urine: NEGATIVE
Hgb urine dipstick: NEGATIVE
Ketones, ur: NEGATIVE
Leukocytes,Ua: NEGATIVE
Nitrite: NEGATIVE
Specific Gravity, Urine: 1.02 (ref 1.000–1.030)
Total Protein, Urine: NEGATIVE
Urine Glucose: NEGATIVE
Urobilinogen, UA: 0.2 (ref 0.0–1.0)
pH: 6 (ref 5.0–8.0)

## 2021-12-06 LAB — CBC WITH DIFFERENTIAL/PLATELET
Basophils Absolute: 0 10*3/uL (ref 0.0–0.1)
Basophils Relative: 0.6 % (ref 0.0–3.0)
Eosinophils Absolute: 0.2 10*3/uL (ref 0.0–0.7)
Eosinophils Relative: 2.5 % (ref 0.0–5.0)
HCT: 43.8 % (ref 39.0–52.0)
Hemoglobin: 14.4 g/dL (ref 13.0–17.0)
Lymphocytes Relative: 48.3 % — ABNORMAL HIGH (ref 12.0–46.0)
Lymphs Abs: 3.2 10*3/uL (ref 0.7–4.0)
MCHC: 32.9 g/dL (ref 30.0–36.0)
MCV: 90.8 fl (ref 78.0–100.0)
Monocytes Absolute: 0.4 10*3/uL (ref 0.1–1.0)
Monocytes Relative: 5.6 % (ref 3.0–12.0)
Neutro Abs: 2.8 10*3/uL (ref 1.4–7.7)
Neutrophils Relative %: 43 % (ref 43.0–77.0)
Platelets: 233 10*3/uL (ref 150.0–400.0)
RBC: 4.82 Mil/uL (ref 4.22–5.81)
RDW: 13.9 % (ref 11.5–15.5)
WBC: 6.6 10*3/uL (ref 4.0–10.5)

## 2021-12-06 LAB — LIPID PANEL
Cholesterol: 233 mg/dL — ABNORMAL HIGH (ref 0–200)
HDL: 89.7 mg/dL (ref >39.00)
LDL Cholesterol: 129 mg/dL — ABNORMAL HIGH (ref 0–99)
NonHDL: 143.17
Total CHOL/HDL Ratio: 3
Triglycerides: 69 mg/dL (ref 0.0–149.0)
VLDL: 13.8 mg/dL (ref 0.0–40.0)

## 2021-12-06 LAB — COMPREHENSIVE METABOLIC PANEL
ALT: 25 U/L (ref 0–53)
AST: 24 U/L (ref 0–37)
Albumin: 4.6 g/dL (ref 3.5–5.2)
Alkaline Phosphatase: 67 U/L (ref 39–117)
BUN: 12 mg/dL (ref 6–23)
CO2: 31 mEq/L (ref 19–32)
Calcium: 10.3 mg/dL (ref 8.4–10.5)
Chloride: 101 mEq/L (ref 96–112)
Creatinine, Ser: 1.1 mg/dL (ref 0.40–1.50)
GFR: 67.96 mL/min (ref >60.00)
Glucose, Bld: 115 mg/dL — ABNORMAL HIGH (ref 70–99)
Potassium: 4.1 mEq/L (ref 3.5–5.1)
Sodium: 139 mEq/L (ref 135–145)
Total Bilirubin: 0.7 mg/dL (ref 0.2–1.2)
Total Protein: 6.9 g/dL (ref 6.0–8.3)

## 2021-12-06 LAB — TSH: TSH: 0.56 u[IU]/mL (ref 0.35–5.50)

## 2021-12-06 LAB — PSA: PSA: 13.19 ng/mL — ABNORMAL HIGH (ref 0.10–4.00)

## 2021-12-06 LAB — TESTOSTERONE: Testosterone: 364.05 ng/dL (ref 300.00–890.00)

## 2021-12-06 MED ORDER — PSEUDOEPHEDRINE HCL ER 120 MG PO TB12: 120.0000 mg | ORAL_TABLET | Freq: Two times a day (BID) | ORAL | 1 refills | Status: DC | PRN

## 2021-12-06 MED ORDER — B COMPLEX-C PO TABS
1.0000 | ORAL_TABLET | Freq: Every day | ORAL | 3 refills | Status: DC
Start: 2021-12-06 — End: 2021-12-08

## 2021-12-06 MED ORDER — METHYLPREDNISOLONE 4 MG PO TBPK: ORAL_TABLET | ORAL | 0 refills | Status: DC

## 2021-12-06 MED ORDER — ALPRAZOLAM 0.5 MG PO TABS
0.5000 mg | ORAL_TABLET | Freq: Two times a day (BID) | ORAL | 1 refills | Status: DC | PRN
Start: 2021-12-06 — End: 2022-12-19

## 2021-12-06 MED ORDER — FAMOTIDINE 40 MG PO TABS: 40.0000 mg | ORAL_TABLET | Freq: Every day | ORAL | 3 refills | Status: DC

## 2021-12-06 NOTE — Assessment & Plan Note (Signed)
Pt quit smoking  Incruze ellipta, Albuterol MDI Worse - Medrol pack Rx

## 2021-12-06 NOTE — Assessment & Plan Note (Signed)
On Allegra Sudafed prn Medrol pack

## 2021-12-06 NOTE — Assessment & Plan Note (Signed)
Chronic   Potential benefits of a long term benzodiazepines  use as well as potential risks  and complications were explained to the patient and were aknowledged. Prn Xanax

## 2021-12-06 NOTE — Progress Notes (Signed)
Subjective:  Patient ID: Joseph Preston, male    DOB: 1950/08/20  Age: 71 y.o. MRN: [id number]  CC: Follow-up (6 month f/u)   HPI Joseph Preston presents for anxiety, COPD, congestion  C/o congestion  Outpatient Medications Prior to Visit  Medication Sig Dispense Refill   B Complex Vitamins (B COMPLEX VITAMIN PO) Take 1 tablet by mouth.     Cholecalciferol (VITAMIN D) 2000 units CAPS 1 po qd 100 capsule 3   fexofenadine-pseudoephedrine (ALLEGRA-D ALLERGY & CONGESTION) 180-240 MG 24 hr tablet Take 1 tablet by mouth daily as needed. 90 tablet 1   hydrOXYzine (ATARAX/VISTARIL) 25 MG tablet Take 1 tablet (25 mg total) by mouth every 8 (eight) hours as needed for itching. 60 tablet 0   hyoscyamine (LEVSIN, ANASPAZ) 0.125 MG tablet TAKE ONE TABLET BY MOUTH EVERY 6 HOURS AS NEEDED (Patient taking differently: Take 0.125 mg by mouth every 6 (six) hours as needed.) 100 tablet 1   magnesium gluconate (MAGONATE) 500 MG tablet Take 250 mg by mouth daily. Uses typically once weekly - helps with his sleep     memantine (NAMENDA) 5 MG tablet Take 1 tablet (5 mg total) by mouth 2 (two) times daily. 180 tablet 3   nortriptyline (PAMELOR) 10 MG capsule TAKE 1 TO 2 CAPSULES BY MOUTH ONCE DAILY AT BEDTIME 180 capsule 3   sildenafil (VIAGRA) 100 MG tablet TAKE 1 TABLET BY MOUTH  DAILY AS NEEDED FOR  ERECTILE DYSFUNCTION 12 tablet 3   tamsulosin (FLOMAX) 0.4 MG CAPS capsule Take 1 capsule (0.4 mg total) by mouth daily. 90 capsule 3   Turmeric (QC TUMERIC COMPLEX PO) Take by mouth.     umeclidinium bromide (INCRUSE ELLIPTA) 62.5 MCG/INH AEPB Inhale 1 puff into the lungs daily. 3 each 3   vitamin C (ASCORBIC ACID) 500 MG tablet Take 500 mg by mouth daily.     ALPRAZolam (XANAX) 0.5 MG tablet Take 1 tablet (0.5 mg total) by mouth 2 (two) times daily as needed. for anxiety 180 tablet 1   Cyanocobalamin (B-12 PO) Take by mouth.     cyclobenzaprine (FLEXERIL) 5 MG tablet TAKE 1 TABLET BY MOUTH TWICE DAILY AS  NEEDED FOR MUSCLE SPASM 180 tablet 0   albuterol (PROAIR HFA) 108 (90 Base) MCG/ACT inhaler Inhale 2 puffs into the lungs every 4 (four) hours as needed for wheezing or shortness of breath. 54 g 3   doxycycline (VIBRA-TABS) 100 MG tablet Take 1 tablet (100 mg total) by mouth 2 (two) times daily. (Patient not taking: Reported on 12/06/2021) 20 tablet 0   No facility-administered medications prior to visit.    ROS: Review of Systems  Constitutional:  Negative for appetite change, fatigue and unexpected weight change.  HENT:  Negative for congestion, nosebleeds, sneezing, sore throat and trouble swallowing.   Eyes:  Negative for itching and visual disturbance.  Respiratory:  Negative for cough.   Cardiovascular:  Negative for chest pain, palpitations and leg swelling.  Gastrointestinal:  Negative for abdominal distention, blood in stool, diarrhea and nausea.  Genitourinary:  Negative for frequency and hematuria.  Musculoskeletal:  Negative for back pain, gait problem, joint swelling and neck pain.  Skin:  Negative for rash.  Neurological:  Negative for dizziness, tremors, speech difficulty and weakness.  Psychiatric/Behavioral:  Negative for agitation, dysphoric mood and sleep disturbance. The patient is nervous/anxious.     Objective:  BP 118/82 (BP Location: Left Arm)   Pulse 77   Temp 98.8 F (37.1 C) (  Oral)   Ht '5\' 7"'$  (1.702 m)   Wt 151 lb (68.5 kg)   SpO2 95%   BMI 23.65 kg/m   BP Readings from Last 3 Encounters:  12/06/21 118/82  06/02/21 122/72  02/21/21 132/82    Wt Readings from Last 3 Encounters:  12/06/21 151 lb (68.5 kg)  06/02/21 150 lb 9.6 oz (68.3 kg)  02/21/21 146 lb (66.2 kg)    Physical Exam Constitutional:      General: He is not in acute distress.    Appearance: He is well-developed.     Comments: NAD  Eyes:     Conjunctiva/sclera: Conjunctivae normal.     Pupils: Pupils are equal, round, and reactive to light.  Neck:     Thyroid: No thyromegaly.      Vascular: No JVD.  Cardiovascular:     Rate and Rhythm: Normal rate and regular rhythm.     Heart sounds: Normal heart sounds. No murmur heard.    No friction rub. No gallop.  Pulmonary:     Effort: Pulmonary effort is normal. No respiratory distress.     Breath sounds: Normal breath sounds. No wheezing or rales.  Chest:     Chest wall: No tenderness.  Abdominal:     General: Bowel sounds are normal. There is no distension.     Palpations: Abdomen is soft. There is no mass.     Tenderness: There is no abdominal tenderness. There is no guarding or rebound.  Musculoskeletal:        General: No tenderness. Normal range of motion.     Cervical back: Normal range of motion.  Lymphadenopathy:     Cervical: No cervical adenopathy.  Skin:    General: Skin is warm and dry.     Findings: No rash.  Neurological:     Mental Status: He is alert and oriented to person, place, and time.     Cranial Nerves: No cranial nerve deficit.     Motor: No abnormal muscle tone.     Coordination: Coordination normal.     Gait: Gait normal.     Deep Tendon Reflexes: Reflexes are normal and symmetric.  Psychiatric:        Behavior: Behavior normal.        Thought Content: Thought content normal.        Judgment: Judgment normal.     Lab Results  Component Value Date   WBC 6.6 12/06/2021   HGB 14.4 12/06/2021   HCT 43.8 12/06/2021   PLT 233.0 12/06/2021   GLUCOSE 115 (H) 12/06/2021   CHOL 233 (H) 12/06/2021   TRIG 69.0 12/06/2021   HDL 89.70 12/06/2021   LDLCALC 129 (H) 12/06/2021   ALT 25 12/06/2021   AST 24 12/06/2021   NA 139 12/06/2021   K 4.1 12/06/2021   CL 101 12/06/2021   CREATININE 1.10 12/06/2021   BUN 12 12/06/2021   CO2 31 12/06/2021   TSH 0.56 12/06/2021   PSA 13.19 (H) 12/06/2021   HGBA1C 5.7 03/10/2016    No results found.  Assessment & Plan:   Problem List Items Addressed This Visit     Well adult exam   Low back pain radiating to left lower extremity    Relevant Medications   methylPREDNISolone (MEDROL DOSEPAK) 4 MG TBPK tablet   Insomnia   Generalized anxiety disorder - Primary    Chronic   Potential benefits of a long term benzodiazepines  use as well as potential risks  and  complications were explained to the patient and were aknowledged. Prn Xanax      Relevant Medications   ALPRAZolam (XANAX) 0.5 MG tablet   COPD exacerbation (HCC)    Pt quit smoking  Incruze ellipta, Albuterol MDI Worse - Medrol pack Rx      Relevant Medications   methylPREDNISolone (MEDROL DOSEPAK) 4 MG TBPK tablet   Allergic rhinitis    On Allegra Sudafed prn Medrol pack        Other Visit Diagnoses     Colon cancer screening       Relevant Orders   Cologuard         Meds ordered this encounter  Medications   DISCONTD: pseudoephedrine (SUDAFED) 120 MG 12 hr tablet    Sig: Take 1 tablet (120 mg total) by mouth every 12 (twelve) hours as needed for congestion.    Dispense:  180 tablet    Refill:  1   methylPREDNISolone (MEDROL DOSEPAK) 4 MG TBPK tablet    Sig: As directed    Dispense:  21 tablet    Refill:  0   famotidine (PEPCID) 40 MG tablet    Sig: Take 1 tablet (40 mg total) by mouth daily.    Dispense:  90 tablet    Refill:  3   DISCONTD: B Complex-C (B-COMPLEX WITH VITAMIN C) tablet    Sig: Take 1 tablet by mouth daily.    Dispense:  90 tablet    Refill:  3   ALPRAZolam (XANAX) 0.5 MG tablet    Sig: Take 1 tablet (0.5 mg total) by mouth 2 (two) times daily as needed. for anxiety    Dispense:  180 tablet    Refill:  1    3 months supply      Follow-up: Return in about 6 months (around 06/06/2022) for Wellness Exam.  Walker Kehr, MD

## 2021-12-08 ENCOUNTER — Telehealth: Payer: Self-pay | Admitting: Internal Medicine

## 2021-12-08 MED ORDER — B COMPLEX-C PO TABS: 1.0000 | ORAL_TABLET | Freq: Every day | ORAL | 3 refills | Status: AC

## 2021-12-08 MED ORDER — PSEUDOEPHEDRINE HCL ER 120 MG PO TB12: 120.0000 mg | ORAL_TABLET | Freq: Two times a day (BID) | ORAL | 1 refills | Status: DC | PRN

## 2021-12-08 NOTE — Telephone Encounter (Signed)
Patient called because he saw Dr Alain Marion the other day and he sent in some medication and 2 of the meds were considered OTC and [facility] wouldn't fill them and advised to be sent to somewhere else. Pateint asked if they can be sent to the [facility] in [place] pseudoephedrine (SUDAFED) 120 MG 12 hr tablet  B Complex-C (B-COMPLEX WITH VITAMIN C) tablet

## 2021-12-08 NOTE — Telephone Encounter (Signed)
Notified pt rx resent to [facility]../lmb 

## 2021-12-16 ENCOUNTER — Telehealth: Payer: Medicare Other

## 2021-12-27 ENCOUNTER — Telehealth: Payer: Self-pay | Admitting: Internal Medicine

## 2021-12-27 DIAGNOSIS — Z1211 Encounter for screening for malignant neoplasm of colon: Secondary | ICD-10-CM

## 2021-12-27 MED ORDER — CYCLOBENZAPRINE HCL 5 MG PO TABS: ORAL_TABLET | ORAL | 0 refills | Status: DC

## 2021-12-27 MED ORDER — UMECLIDINIUM BROMIDE 62.5 MCG/ACT IN AEPB: 1.0000 | INHALATION_SPRAY | Freq: Every day | RESPIRATORY_TRACT | 1 refills | Status: DC

## 2021-12-27 NOTE — Telephone Encounter (Signed)
Call pt to advise why COLOGARD has not been mailed to home yet. Pt ph [phone].  Also requesting REFILLS:  CYCLOBENZAPINE 5 MG;  last written 03/24/21 INCRUSE ELLIPTA 62.5 mcg , last written 08/24/20  [facility]  Last visit: 12/06/21  Next appt: 06/06/22

## 2021-12-27 NOTE — Telephone Encounter (Signed)
Called pt inform sent refill to mail order, amd told him we can reorder the cologuard & gave their # to him to check status.Marland KitchenJohny Preston

## 2022-01-03 DIAGNOSIS — Z1211 Encounter for screening for malignant neoplasm of colon: Secondary | ICD-10-CM | POA: Diagnosis not present

## 2022-01-10 ENCOUNTER — Other Ambulatory Visit: Payer: Self-pay | Admitting: *Deleted

## 2022-01-10 MED ORDER — CYCLOBENZAPRINE HCL 5 MG PO TABS: ORAL_TABLET | ORAL | 0 refills | Status: DC

## 2022-01-10 MED ORDER — UMECLIDINIUM BROMIDE 62.5 MCG/ACT IN AEPB: 1.0000 | INHALATION_SPRAY | Freq: Every day | RESPIRATORY_TRACT | 1 refills | Status: DC

## 2022-01-10 NOTE — Telephone Encounter (Signed)
Rec'd void script from [facility] stating can't locate patient and need to verify. Called pt verify if he still use [facility] pt say no he used [facility]. Inform pt [facility] on inhaler & cyclobenzaprine will send rx to [place]..Princella Pellegrini

## 2022-01-12 LAB — COLOGUARD: COLOGUARD: NEGATIVE

## 2022-01-12 MED ORDER — CYCLOBENZAPRINE HCL 5 MG PO TABS: 5.0000 mg | ORAL_TABLET | Freq: Two times a day (BID) | ORAL | 0 refills | Status: DC | PRN

## 2022-01-12 NOTE — Telephone Encounter (Signed)
Pt states that [facility] won't fill the CYCLOBENZAPINE 5 MG RX, but [facility] won't give a reason as to why they won't fill it.   Pt is asking that if we can't get the RX to go through [place] can we please send the RX to [facility]   Phone: [phone]  Fax: [phone]

## 2022-01-12 NOTE — Addendum Note (Signed)
Addended by: Earnstine Regal on: 01/12/2022 12:18 PM   Modules accepted: Orders

## 2022-01-12 NOTE — Telephone Encounter (Signed)
Notified pt we resent rx to [place].Marland KitchenJohny Preston

## 2022-03-03 ENCOUNTER — Telehealth: Payer: Self-pay | Admitting: Internal Medicine

## 2022-03-03 MED ORDER — NORTRIPTYLINE HCL 10 MG PO CAPS: ORAL_CAPSULE | ORAL | 0 refills | Status: DC

## 2022-03-03 NOTE — Telephone Encounter (Signed)
Pt called requesting Rx for nortriptyline (PAMELOR) 10 MG capsule

## 2022-03-06 DIAGNOSIS — H04123 Dry eye syndrome of bilateral lacrimal glands: Secondary | ICD-10-CM | POA: Diagnosis not present

## 2022-03-06 DIAGNOSIS — H524 Presbyopia: Secondary | ICD-10-CM | POA: Diagnosis not present

## 2022-03-06 DIAGNOSIS — H2513 Age-related nuclear cataract, bilateral: Secondary | ICD-10-CM | POA: Diagnosis not present

## 2022-03-06 DIAGNOSIS — H532 Diplopia: Secondary | ICD-10-CM | POA: Diagnosis not present

## 2022-03-06 DIAGNOSIS — H25013 Cortical age-related cataract, bilateral: Secondary | ICD-10-CM | POA: Diagnosis not present

## 2022-04-18 ENCOUNTER — Other Ambulatory Visit: Payer: Self-pay | Admitting: Internal Medicine

## 2022-04-18 DIAGNOSIS — Z0000 Encounter for general adult medical examination without abnormal findings: Secondary | ICD-10-CM

## 2022-04-27 ENCOUNTER — Telehealth (INDEPENDENT_AMBULATORY_CARE_PROVIDER_SITE_OTHER): Payer: Medicare Other | Admitting: Nurse Practitioner

## 2022-04-27 VITALS — BP 125/82 | HR 64 | Temp 97.4°F

## 2022-04-27 DIAGNOSIS — B349 Viral infection, unspecified: Secondary | ICD-10-CM | POA: Diagnosis not present

## 2022-04-27 NOTE — Progress Notes (Signed)
   Established Patient Office Visit  An audio/visual tele-health visit was completed today for this patient. I connected with  Joseph Preston on 04/27/22 utilizing audio/visual technology and verified that I am speaking with the correct person using two identifiers. The patient was located at their home, and I was located at the office of [place] at [facility] during the encounter. I discussed the limitations of evaluation and management by telemedicine. The patient expressed understanding and agreed to proceed.    Subjective   Patient ID: Joseph Preston, male    DOB: 31-Jul-1950  Age: 72 y.o. MRN: [id number]  Chief Complaint  Patient presents with   Chills    Pt was tested COVID is negative, the third day since he has been sick, chills is better than before, light headed more and headaches come and goes. Doxycyline     Symptom onset 3 days ago, reports that he took a covid test which was negative. Overall, all symptoms are improving. Does still have some mild dizziness/dysequilibrium feeling (however this too is improving). Had some left over doxycycline which he reports is in date. He started taking this.     Review of Systems  Constitutional:  Positive for chills (improving).  Respiratory:  Negative for cough, sputum production and shortness of breath.   Cardiovascular:  Negative for chest pain.  Gastrointestinal:  Negative for diarrhea, nausea and vomiting.  Musculoskeletal:  Positive for myalgias (improving).  Neurological:  Positive for dizziness and headaches (improving).      Objective:     BP 125/82   Pulse 64   Temp (!) 97.4 F (36.3 C)  BP Readings from Last 3 Encounters:  04/27/22 125/82  12/06/21 118/82  06/02/21 122/72   Wt Readings from Last 3 Encounters:  12/06/21 151 lb (68.5 kg)  06/02/21 150 lb 9.6 oz (68.3 kg)  02/21/21 146 lb (66.2 kg)      Physical Exam Comprehensive physical exam not completed today as office visit was  conducted remotely.  Patient appears well over video, no signs of acute severe distress.  Patient was alert and oriented, and appeared to have appropriate judgment.   No results found for any visits on 04/27/22.    The 10-year ASCVD risk score (Arnett DK, et al., 2019) is: 15.7%    Assessment & Plan:   Problem List Items Addressed This Visit       Other   Viral infection - Primary    Acute, etiology unclear. Symptoms improving. Continue with supportive treatment: Rest, hydration, use of as needed antipyretics. Patient will complete doxycycline that he has for total of 5 days. Call office if symptoms persist or worsen for in person evaluation.       No follow-ups on file.    Joseph Ards, NP

## 2022-04-27 NOTE — Assessment & Plan Note (Signed)
Acute, etiology unclear. Symptoms improving. Continue with supportive treatment: Rest, hydration, use of as needed antipyretics. Patient will complete doxycycline that he has for total of 5 days. Call office if symptoms persist or worsen for in person evaluation.

## 2022-06-06 ENCOUNTER — Encounter: Payer: Self-pay | Admitting: Internal Medicine

## 2022-06-06 ENCOUNTER — Ambulatory Visit (INDEPENDENT_AMBULATORY_CARE_PROVIDER_SITE_OTHER): Payer: Medicare Other | Admitting: Internal Medicine

## 2022-06-06 VITALS — BP 130/72 | HR 85 | Temp 97.8°F | Ht 67.0 in | Wt 152.0 lb

## 2022-06-06 DIAGNOSIS — R972 Elevated prostate specific antigen [PSA]: Secondary | ICD-10-CM | POA: Diagnosis not present

## 2022-06-06 DIAGNOSIS — J3089 Other allergic rhinitis: Secondary | ICD-10-CM | POA: Diagnosis not present

## 2022-06-06 DIAGNOSIS — F411 Generalized anxiety disorder: Secondary | ICD-10-CM | POA: Diagnosis not present

## 2022-06-06 DIAGNOSIS — R7989 Other specified abnormal findings of blood chemistry: Secondary | ICD-10-CM | POA: Diagnosis not present

## 2022-06-06 DIAGNOSIS — K588 Other irritable bowel syndrome: Secondary | ICD-10-CM

## 2022-06-06 LAB — COMPREHENSIVE METABOLIC PANEL
ALT: 61 U/L — ABNORMAL HIGH (ref 0–53)
AST: 40 U/L — ABNORMAL HIGH (ref 0–37)
Albumin: 4.3 g/dL (ref 3.5–5.2)
Alkaline Phosphatase: 75 U/L (ref 39–117)
BUN: 19 mg/dL (ref 6–23)
CO2: 32 mEq/L (ref 19–32)
Calcium: 10.4 mg/dL (ref 8.4–10.5)
Chloride: 100 mEq/L (ref 96–112)
Creatinine, Ser: 1.07 mg/dL (ref 0.40–1.50)
GFR: 70.01 mL/min (ref >60.00)
Glucose, Bld: 99 mg/dL (ref 70–99)
Potassium: 4.7 mEq/L (ref 3.5–5.1)
Sodium: 140 mEq/L (ref 135–145)
Total Bilirubin: 1.1 mg/dL (ref 0.2–1.2)
Total Protein: 6.6 g/dL (ref 6.0–8.3)

## 2022-06-06 LAB — TSH: TSH: 0.92 u[IU]/mL (ref 0.35–5.50)

## 2022-06-06 LAB — PSA: PSA: 16.56 ng/mL — ABNORMAL HIGH (ref 0.10–4.00)

## 2022-06-06 MED ORDER — HYOSCYAMINE SULFATE 0.125 MG PO TABS: 0.1250 mg | ORAL_TABLET | ORAL | 1 refills | Status: AC | PRN

## 2022-06-06 MED ORDER — PSEUDOEPHEDRINE HCL ER 120 MG PO TB12: 120.0000 mg | ORAL_TABLET | Freq: Two times a day (BID) | ORAL | 1 refills | Status: AC | PRN

## 2022-06-06 MED ORDER — METHYLPREDNISOLONE 4 MG PO TBPK: ORAL_TABLET | ORAL | 0 refills | Status: DC

## 2022-06-06 NOTE — Assessment & Plan Note (Signed)
Chronic   Potential benefits of a long term benzodiazepines  use as well as potential risks  and complications were explained to the patient and were aknowledged. Prn Xanax 

## 2022-06-06 NOTE — Patient Instructions (Signed)
USEFUL THINGS FOR HAND ARTHRITIS  RICE SOCK HEATING PAD: [email or web address]://[email or web address].com/Homemade-Heating-Pad/    SILICONE PADS:    BRIX JAR OPENER:    NITRILE COATED GARDEN GLOVES:   THUMB BRACE:     BLUE EMU CREAM:    

## 2022-06-06 NOTE — Progress Notes (Signed)
Subjective:  Patient ID: Joseph Preston, male    DOB: May 25, 1950  Age: 72 y.o. MRN: [id number]  CC: Annual Exam   HPI Joseph Preston presents for allergies - worse C/o anxiety, IBS w/pains  Outpatient Medications Prior to Visit  Medication Sig Dispense Refill   ALPRAZolam (XANAX) 0.5 MG tablet Take 1 tablet (0.5 mg total) by mouth 2 (two) times daily as needed. for anxiety 180 tablet 1   B Complex-C (B-COMPLEX WITH VITAMIN C) tablet Take 1 tablet by mouth daily. 90 tablet 3   Cholecalciferol (VITAMIN D) 2000 units CAPS 1 po qd 100 capsule 3   cyclobenzaprine (FLEXERIL) 5 MG tablet Take 1 tablet (5 mg total) by mouth 2 (two) times daily as needed for muscle spasms. 180 tablet 0   famotidine (PEPCID) 40 MG tablet Take 1 tablet (40 mg total) by mouth daily. 90 tablet 3   fexofenadine-pseudoephedrine (ALLEGRA-D ALLERGY & CONGESTION) 180-240 MG 24 hr tablet Take 1 tablet by mouth daily as needed. 90 tablet 1   hydrOXYzine (ATARAX/VISTARIL) 25 MG tablet Take 1 tablet (25 mg total) by mouth every 8 (eight) hours as needed for itching. 60 tablet 0   magnesium gluconate (MAGONATE) 500 MG tablet Take 250 mg by mouth daily. Uses typically once weekly - helps with his sleep     memantine (NAMENDA) 5 MG tablet Take 1 tablet (5 mg total) by mouth 2 (two) times daily. 180 tablet 3   nortriptyline (PAMELOR) 10 MG capsule TAKE 1 TO 2 CAPSULES BY MOUTH  ONCE DAILY AT BEDTIME Annual appt due in May must see provider for future refills 180 capsule 0   sildenafil (VIAGRA) 100 MG tablet TAKE 1 TABLET BY MOUTH DAILY AS  NEEDED FOR ERECTILE DYSFUNCTION 12 tablet 0   tamsulosin (FLOMAX) 0.4 MG CAPS capsule Take 1 capsule (0.4 mg total) by mouth daily. 90 capsule 3   Turmeric (QC TUMERIC COMPLEX PO) Take by mouth.     umeclidinium bromide (INCRUSE ELLIPTA) 62.5 MCG/ACT AEPB Inhale 1 puff into the lungs daily. 90 each 1   umeclidinium bromide (INCRUSE ELLIPTA) 62.5 MCG/INH AEPB Inhale 1 puff into the lungs  daily. 3 each 3   vitamin C (ASCORBIC ACID) 500 MG tablet Take 500 mg by mouth daily.     B Complex Vitamins (B COMPLEX VITAMIN PO) Take 1 tablet by mouth.     hyoscyamine (LEVSIN, ANASPAZ) 0.125 MG tablet TAKE ONE TABLET BY MOUTH EVERY 6 HOURS AS NEEDED (Patient taking differently: Take 0.125 mg by mouth every 6 (six) hours as needed.) 100 tablet 1   methylPREDNISolone (MEDROL DOSEPAK) 4 MG TBPK tablet As directed 21 tablet 0   pseudoephedrine (SUDAFED) 120 MG 12 hr tablet Take 1 tablet (120 mg total) by mouth every 12 (twelve) hours as needed for congestion. 180 tablet 1   albuterol (PROAIR HFA) 108 (90 Base) MCG/ACT inhaler Inhale 2 puffs into the lungs every 4 (four) hours as needed for wheezing or shortness of breath. 54 g 3   No facility-administered medications prior to visit.    ROS: Review of Systems  Constitutional:  Negative for appetite change, fatigue and unexpected weight change.  HENT:  Positive for congestion. Negative for nosebleeds, sneezing, sore throat and trouble swallowing.   Eyes:  Negative for itching and visual disturbance.  Respiratory:  Negative for cough.   Cardiovascular:  Negative for chest pain, palpitations and leg swelling.  Gastrointestinal:  Positive for abdominal pain. Negative for abdominal distention, blood in  stool, diarrhea and nausea.  Genitourinary:  Negative for frequency and hematuria.  Musculoskeletal:  Negative for back pain, gait problem, joint swelling and neck pain.  Skin:  Negative for rash.  Neurological:  Negative for dizziness, tremors, speech difficulty and weakness.  Psychiatric/Behavioral:  Negative for agitation, dysphoric mood, sleep disturbance and suicidal ideas. The patient is nervous/anxious.     Objective:  BP 130/72 (BP Location: Left Arm, Patient Position: Sitting, Cuff Size: Large)   Pulse 85   Temp 97.8 F (36.6 C) (Temporal)   Ht 5\' 7"  (1.702 m)   Wt 152 lb (68.9 kg)   SpO2 98%   BMI 23.81 kg/m   BP Readings from  Last 3 Encounters:  06/06/22 130/72  04/27/22 125/82  12/06/21 118/82    Wt Readings from Last 3 Encounters:  06/06/22 152 lb (68.9 kg)  12/06/21 151 lb (68.5 kg)  06/02/21 150 lb 9.6 oz (68.3 kg)    Physical Exam Constitutional:      General: He is not in acute distress.    Appearance: Normal appearance. He is well-developed.     Comments: NAD  Eyes:     Conjunctiva/sclera: Conjunctivae normal.     Pupils: Pupils are equal, round, and reactive to light.  Neck:     Thyroid: No thyromegaly.     Vascular: No JVD.  Cardiovascular:     Rate and Rhythm: Normal rate and regular rhythm.     Heart sounds: Normal heart sounds. No murmur heard.    No friction rub. No gallop.  Pulmonary:     Effort: Pulmonary effort is normal. No respiratory distress.     Breath sounds: Normal breath sounds. No wheezing or rales.  Chest:     Chest wall: No tenderness.  Abdominal:     General: Bowel sounds are normal. There is no distension.     Palpations: Abdomen is soft. There is no mass.     Tenderness: There is no abdominal tenderness. There is no guarding or rebound.  Musculoskeletal:        General: No tenderness. Normal range of motion.     Cervical back: Normal range of motion.  Lymphadenopathy:     Cervical: No cervical adenopathy.  Skin:    General: Skin is warm and dry.     Findings: No rash.  Neurological:     Mental Status: He is alert and oriented to person, place, and time.     Cranial Nerves: No cranial nerve deficit.     Motor: No abnormal muscle tone.     Coordination: Coordination normal.     Gait: Gait normal.     Deep Tendon Reflexes: Reflexes are normal and symmetric.  Psychiatric:        Behavior: Behavior normal.        Thought Content: Thought content normal.        Judgment: Judgment normal.     Lab Results  Component Value Date   WBC 6.6 12/06/2021   HGB 14.4 12/06/2021   HCT 43.8 12/06/2021   PLT 233.0 12/06/2021   GLUCOSE 115 (H) 12/06/2021   CHOL 233  (H) 12/06/2021   TRIG 69.0 12/06/2021   HDL 89.70 12/06/2021   LDLCALC 129 (H) 12/06/2021   ALT 25 12/06/2021   AST 24 12/06/2021   NA 139 12/06/2021   K 4.1 12/06/2021   CL 101 12/06/2021   CREATININE 1.10 12/06/2021   BUN 12 12/06/2021   CO2 31 12/06/2021   TSH 0.56 12/06/2021  PSA 13.19 (H) 12/06/2021   HGBA1C 5.7 03/10/2016    No results found.  Assessment & Plan:   Problem List Items Addressed This Visit     Generalized anxiety disorder    Chronic   Potential benefits of a long term benzodiazepines  use as well as potential risks  and complications were explained to the patient and were aknowledged. Prn Xanax      Relevant Orders   Comprehensive metabolic panel   TSH   Allergic rhinitis    Worse On Allegra Sudafed prn Medrol pack      Relevant Orders   Comprehensive metabolic panel   Irritable bowel syndrome (IBS) - Primary    BS-c, worse Chronic w/pain Levsin prn      Relevant Medications   hyoscyamine (LEVSIN) 0.125 MG tablet   Other Relevant Orders   Comprehensive metabolic panel   Elevated PSA    Monitor PSA      Relevant Orders   PSA      Meds ordered this encounter  Medications   hyoscyamine (LEVSIN) 0.125 MG tablet    Sig: Take 1-2 tablets (0.125-0.25 mg total) by mouth every 4 (four) hours as needed.    Dispense:  120 tablet    Refill:  1    Please consider 90 day supplies to promote better adherence   methylPREDNISolone (MEDROL DOSEPAK) 4 MG TBPK tablet    Sig: As directed    Dispense:  21 tablet    Refill:  0   pseudoephedrine (SUDAFED) 120 MG 12 hr tablet    Sig: Take 1 tablet (120 mg total) by mouth every 12 (twelve) hours as needed for congestion.    Dispense:  180 tablet    Refill:  1      Follow-up: Return in about 4 months (around 10/07/2022) for a follow-up visit.  Sonda Primes, MD

## 2022-06-06 NOTE — Assessment & Plan Note (Signed)
BS-c, worse Chronic w/pain Levsin prn

## 2022-06-06 NOTE — Assessment & Plan Note (Signed)
Worse On Allegra Sudafed prn Medrol pack

## 2022-06-06 NOTE — Assessment & Plan Note (Signed)
Monitor PSA 

## 2022-06-07 DIAGNOSIS — R7989 Other specified abnormal findings of blood chemistry: Secondary | ICD-10-CM | POA: Insufficient documentation

## 2022-06-07 NOTE — Addendum Note (Signed)
Addended by: Tresa Garter on: 06/07/2022 12:17 AM   Modules accepted: Orders

## 2022-06-07 NOTE — Assessment & Plan Note (Signed)
Mild.  Cut back on beer. Obtain liver ultrasound

## 2022-06-15 ENCOUNTER — Ambulatory Visit
Admission: RE | Admit: 2022-06-15 | Discharge: 2022-06-15 | Disposition: A | Discharge status: Home / Self Care | Payer: Medicare Other | Source: Ambulatory Visit | Attending: Internal Medicine | Admitting: Internal Medicine

## 2022-06-15 DIAGNOSIS — R7989 Other specified abnormal findings of blood chemistry: Secondary | ICD-10-CM

## 2022-06-15 DIAGNOSIS — K8020 Calculus of gallbladder without cholecystitis without obstruction: Secondary | ICD-10-CM | POA: Diagnosis not present

## 2022-06-16 ENCOUNTER — Encounter: Payer: Self-pay | Admitting: Internal Medicine

## 2022-06-16 DIAGNOSIS — K760 Fatty (change of) liver, not elsewhere classified: Secondary | ICD-10-CM | POA: Insufficient documentation

## 2022-06-16 DIAGNOSIS — K8020 Calculus of gallbladder without cholecystitis without obstruction: Secondary | ICD-10-CM | POA: Insufficient documentation

## 2022-06-19 ENCOUNTER — Other Ambulatory Visit: Payer: Self-pay | Admitting: Internal Medicine

## 2022-06-27 ENCOUNTER — Telehealth (INDEPENDENT_AMBULATORY_CARE_PROVIDER_SITE_OTHER): Payer: Medicare Other | Admitting: Family Medicine

## 2022-06-27 VITALS — BP 139/80 | HR 61

## 2022-06-27 DIAGNOSIS — Z789 Other specified health status: Secondary | ICD-10-CM

## 2022-06-27 DIAGNOSIS — R7989 Other specified abnormal findings of blood chemistry: Secondary | ICD-10-CM | POA: Diagnosis not present

## 2022-06-27 DIAGNOSIS — R5383 Other fatigue: Secondary | ICD-10-CM

## 2022-06-27 DIAGNOSIS — R413 Other amnesia: Secondary | ICD-10-CM

## 2022-06-27 DIAGNOSIS — R42 Dizziness and giddiness: Secondary | ICD-10-CM | POA: Diagnosis not present

## 2022-06-27 DIAGNOSIS — R972 Elevated prostate specific antigen [PSA]: Secondary | ICD-10-CM | POA: Diagnosis not present

## 2022-06-27 DIAGNOSIS — K760 Fatty (change of) liver, not elsewhere classified: Secondary | ICD-10-CM
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
## 2022-06-28 DIAGNOSIS — R5383 Other fatigue: Secondary | ICD-10-CM | POA: Diagnosis not present

## 2022-06-28 DIAGNOSIS — Z789 Other specified health status: Secondary | ICD-10-CM | POA: Diagnosis not present

## 2022-06-28 LAB — CBC WITH DIFFERENTIAL/PLATELET
Basophils Absolute: 0 10*3/uL (ref 0.0–0.1)
Basophils Relative: 0.2 % (ref 0.0–3.0)
Eosinophils Absolute: 0.1 10*3/uL (ref 0.0–0.7)
Eosinophils Relative: 2.5 % (ref 0.0–5.0)
HCT: 43.5 % (ref 39.0–52.0)
Hemoglobin: 14.4 g/dL (ref 13.0–17.0)
Lymphocytes Relative: 42.8 % (ref 12.0–46.0)
Lymphs Abs: 2.5 10*3/uL (ref 0.7–4.0)
MCHC: 33.1 g/dL (ref 30.0–36.0)
MCV: 90.7 fl (ref 78.0–100.0)
Monocytes Absolute: 0.5 10*3/uL (ref 0.1–1.0)
Monocytes Relative: 8.2 % (ref 3.0–12.0)
Neutro Abs: 2.7 10*3/uL (ref 1.4–7.7)
Neutrophils Relative %: 46.3 % (ref 43.0–77.0)
Platelets: 223 10*3/uL (ref 150.0–400.0)
RBC: 4.79 Mil/uL (ref 4.22–5.81)
RDW: 14.3 % (ref 11.5–15.5)
WBC: 5.8 10*3/uL (ref 4.0–10.5)

## 2022-06-28 LAB — HEPATIC FUNCTION PANEL
ALT: 18 U/L (ref 0–53)
AST: 19 U/L (ref 0–37)
Albumin: 4 g/dL (ref 3.5–5.2)
Alkaline Phosphatase: 63 U/L (ref 39–117)
Bilirubin, Direct: 0.2 mg/dL (ref 0.0–0.3)
Total Bilirubin: 1.1 mg/dL (ref 0.2–1.2)
Total Protein: 6.8 g/dL (ref 6.0–8.3)

## 2022-06-28 LAB — BASIC METABOLIC PANEL
BUN: 15 mg/dL (ref 6–23)
CO2: 31 mEq/L (ref 19–32)
Calcium: 10.2 mg/dL (ref 8.4–10.5)
Chloride: 102 mEq/L (ref 96–112)
Creatinine, Ser: 0.93 mg/dL (ref 0.40–1.50)
GFR: 82.8 mL/min (ref >60.00)
Glucose, Bld: 129 mg/dL — ABNORMAL HIGH (ref 70–99)
Potassium: 4.7 mEq/L (ref 3.5–5.1)
Sodium: 137 mEq/L (ref 135–145)

## 2022-06-28 LAB — T4, FREE: Free T4: 0.79 ng/dL (ref 0.60–1.60)

## 2022-06-28 LAB — VITAMIN D 25 HYDROXY (VIT D DEFICIENCY, FRACTURES): VITD: 48.86 ng/mL (ref 30.00–100.00)

## 2022-06-28 LAB — VITAMIN B12: Vitamin B-12: 300 pg/mL (ref 211–911)

## 2022-06-28 LAB — TSH: TSH: 0.84 u[IU]/mL (ref 0.35–5.50)

## 2022-07-02 LAB — VITAMIN B1: Vitamin B1 (Thiamine): 18 nmol/L (ref 8–30)

## 2022-07-10 ENCOUNTER — Ambulatory Visit: Payer: Medicare Other | Admitting: Internal Medicine

## 2022-07-13 ENCOUNTER — Ambulatory Visit (INDEPENDENT_AMBULATORY_CARE_PROVIDER_SITE_OTHER): Payer: Medicare Other | Admitting: Urology

## 2022-07-13 ENCOUNTER — Encounter: Payer: Self-pay | Admitting: Urology

## 2022-07-13 VITALS — BP 121/78 | HR 71 | Ht 67.0 in | Wt 151.0 lb

## 2022-07-13 DIAGNOSIS — N138 Other obstructive and reflux uropathy: Secondary | ICD-10-CM | POA: Diagnosis not present

## 2022-07-13 DIAGNOSIS — N5201 Erectile dysfunction due to arterial insufficiency: Secondary | ICD-10-CM | POA: Diagnosis not present

## 2022-07-13 DIAGNOSIS — R972 Elevated prostate specific antigen [PSA]: Secondary | ICD-10-CM | POA: Diagnosis not present

## 2022-07-13 DIAGNOSIS — N401 Enlarged prostate with lower urinary tract symptoms: Secondary | ICD-10-CM | POA: Diagnosis not present

## 2022-07-13 NOTE — Progress Notes (Signed)
Subjective: 1. Elevated PSA   2. BPH with urinary obstruction   3. Erectile dysfunction due to arterial insufficiency      Consult requested by Dr. Sonda Primes.  [name] is a former patient of Dr. Retta Diones who is sent back for a PSA of 16.56.  He had a negative biopsy in 2015 when his PSA was up to 10.12.  His prostate was 60ml at that time.  He has BPH with BOO and has an IPSS of 19 on tamuslosin. He has nocturia x 3.  He is on sildenafil for ED with some response.  He has had no hematuria or dysuria.  He has had no UTI's.     IPSS     Row Name 07/13/22 1300         International Prostate Symptom Score   How often have you had the sensation of not emptying your bladder? About half the time     How often have you had to urinate less than every two hours? About half the time     How often have you found you stopped and started again several times when you urinated? About half the time     How often have you found it difficult to postpone urination? About half the time     How often have you had a weak urinary stream? More than half the time     How often have you had to strain to start urination? Not at All     How many times did you typically get up at night to urinate? 3 Times     Total IPSS Score 19       Quality of Life due to urinary symptoms   If you were to spend the rest of your life with your urinary condition just the way it is now how would you feel about that? Mostly Disatisfied              ROS:  Review of Systems  Respiratory:  Positive for shortness of breath.   Gastrointestinal:  Positive for constipation and diarrhea.  Musculoskeletal:  Positive for back pain and joint pain.  Neurological:  Positive for dizziness and weakness.  Psychiatric/Behavioral:  Positive for depression and memory loss. The patient is nervous/anxious.     Allergies  Allergen Reactions   Penicillins Itching and Swelling   Sulfonamide Derivatives Itching and Swelling    Varenicline Tartrate Other (See Comments)    states that he had nightmares and felt crazy    Aricept [Donepezil]     Upset stomach   Tramadol Other (See Comments)    dizzy    Past Medical History:  Diagnosis Date   Anxiety    Arthritis    Back pain, chronic     Past Surgical History:  Procedure Laterality Date   COLONOSCOPY W/ POLYPECTOMY     LEG SURGERY      Social History   Socioeconomic History   Marital status: Legally Separated    Spouse name: Not on file   Number of children: Not on file   Years of education: Not on file   Highest education level: Not on file  Occupational History   Not on file  Tobacco Use   Smoking status: Former    Packs/day: 1.00    Years: 19.00    Additional pack years: 0.00    Total pack years: 19.00    Types: Cigarettes    Quit date: 12/24/2020    Years since  quitting: 1.5   Smokeless tobacco: Former  Substance and Sexual Activity   Alcohol use: No   Drug use: No   Sexual activity: Yes  Other Topics Concern   Not on file  Social History Narrative   Not on file   Social Determinants of Health   Financial Resource Strain: Low Risk  (12/05/2021)   Overall Financial Resource Strain (CARDIA)    Difficulty of Paying Living Expenses: Not hard at all  Food Insecurity: No Food Insecurity (12/05/2021)   Hunger Vital Sign    Worried About Running Out of Food in the Last Year: Never true    Ran Out of Food in the Last Year: Never true  Transportation Needs: No Transportation Needs (12/05/2021)   PRAPARE - Administrator, Civil Service (Medical): No    Lack of Transportation (Non-Medical): No  Physical Activity: Sufficiently Active (12/05/2021)   Exercise Vital Sign    Days of Exercise per Week: 5 days    Minutes of Exercise per Session: 30 min  Stress: No Stress Concern Present (12/05/2021)   [employer] of Occupational Health - Occupational Stress Questionnaire    Feeling of Stress : Not at all  Social Connections:  Moderately Integrated (12/05/2021)   Social Connection and Isolation Panel [NHANES]    Frequency of Communication with Friends and Family: More than three times a week    Frequency of Social Gatherings with Friends and Family: More than three times a week    Attends Religious Services: More than 4 times per year    Active Member of [employer] or Organizations: Yes    Attends Banker Meetings: More than 4 times per year    Marital Status: Separated  Intimate Partner Violence: Not At Risk (12/05/2021)   Humiliation, Afraid, Rape, and Kick questionnaire    Fear of Current or Ex-Partner: No    Emotionally Abused: No    Physically Abused: No    Sexually Abused: No    Family History  Problem Relation Age of Onset   Cancer Mother 60       lung ca   Cancer Father 87       glioblastoma    Anti-infectives: Anti-infectives (From admission, onward)    None       Current Outpatient Medications  Medication Sig Dispense Refill   ALPRAZolam (XANAX) 0.5 MG tablet Take 1 tablet (0.5 mg total) by mouth 2 (two) times daily as needed. for anxiety 180 tablet 1   B Complex-C (B-COMPLEX WITH VITAMIN C) tablet Take 1 tablet by mouth daily. 90 tablet 3   Cholecalciferol (VITAMIN D) 2000 units CAPS 1 po qd 100 capsule 3   cyclobenzaprine (FLEXERIL) 5 MG tablet Take 1 tablet (5 mg total) by mouth 2 (two) times daily as needed for muscle spasms. 180 tablet 0   famotidine (PEPCID) 40 MG tablet Take 1 tablet (40 mg total) by mouth daily. 90 tablet 3   fexofenadine-pseudoephedrine (ALLEGRA-D ALLERGY & CONGESTION) 180-240 MG 24 hr tablet Take 1 tablet by mouth daily as needed. 90 tablet 1   hydrOXYzine (ATARAX/VISTARIL) 25 MG tablet Take 1 tablet (25 mg total) by mouth every 8 (eight) hours as needed for itching. 60 tablet 0   hyoscyamine (LEVSIN) 0.125 MG tablet Take 1-2 tablets (0.125-0.25 mg total) by mouth every 4 (four) hours as needed. 120 tablet 1   magnesium gluconate (MAGONATE) 500 MG tablet  Take 250 mg by mouth daily. Uses typically once weekly - helps with  his sleep     memantine (NAMENDA) 5 MG tablet Take 1 tablet (5 mg total) by mouth 2 (two) times daily. 180 tablet 3   nortriptyline (PAMELOR) 10 MG capsule TAKE 1 TO 2 CAPSULES BY MOUTH  ONCE DAILY AT BEDTIME 180 capsule 1   pseudoephedrine (SUDAFED) 120 MG 12 hr tablet Take 1 tablet (120 mg total) by mouth every 12 (twelve) hours as needed for congestion. 180 tablet 1   sildenafil (VIAGRA) 100 MG tablet TAKE 1 TABLET BY MOUTH DAILY AS  NEEDED FOR ERECTILE DYSFUNCTION 12 tablet 0   tamsulosin (FLOMAX) 0.4 MG CAPS capsule Take 1 capsule (0.4 mg total) by mouth daily. 90 capsule 3   Turmeric (QC TUMERIC COMPLEX PO) Take by mouth.     umeclidinium bromide (INCRUSE ELLIPTA) 62.5 MCG/ACT AEPB Inhale 1 puff into the lungs daily. 90 each 1   umeclidinium bromide (INCRUSE ELLIPTA) 62.5 MCG/INH AEPB Inhale 1 puff into the lungs daily. 3 each 3   vitamin C (ASCORBIC ACID) 500 MG tablet Take 500 mg by mouth daily.     albuterol (PROAIR HFA) 108 (90 Base) MCG/ACT inhaler Inhale 2 puffs into the lungs every 4 (four) hours as needed for wheezing or shortness of breath. 54 g 3   No current facility-administered medications for this visit.     Objective: Vital signs in last 24 hours: BP 121/78   Pulse 71   Ht 5\' 7"  (1.702 m)   Wt 151 lb (68.5 kg)   BMI 23.65 kg/m   Intake/Output from previous day: No intake/output data recorded. Intake/Output this shift: @IOTHISSHIFT @   Physical Exam Vitals reviewed.  Constitutional:      Appearance: Normal appearance.  Cardiovascular:     Rate and Rhythm: Normal rate and regular rhythm.  Pulmonary:     Effort: Pulmonary effort is normal. No respiratory distress.  Abdominal:     General: Abdomen is flat.     Palpations: Abdomen is soft.     Hernia: No hernia is present.  Genitourinary:    Comments: Nl phallus with adequate meatus. Scrotum and testes normal. AP/NST without  mass. Prostate 2.5-3+ firm R>L. SV non-palpable.  Musculoskeletal:        General: No swelling or tenderness. Normal range of motion.  Lymphadenopathy:     Cervical: No cervical adenopathy.     Upper Body:     Right upper body: No supraclavicular adenopathy.     Left upper body: No supraclavicular adenopathy.     Lower Body: No right inguinal adenopathy. No left inguinal adenopathy.  Skin:    General: Skin is warm and dry.  Neurological:     General: No focal deficit present.     Mental Status: He is alert and oriented to person, place, and time.  Psychiatric:        Mood and Affect: Mood normal.        Behavior: Behavior normal.     Lab Results:  No results found for this or any previous visit (from the past 24 hour(s)).  BMET No results for input(s): "NA", "K", "CL", "CO2", "GLUCOSE", "BUN", "CREATININE", "CALCIUM" in the last 72 hours. PT/INR No results for input(s): "LABPROT", "INR" in the last 72 hours. ABG No results for input(s): "PHART", "HCO3" in the last 72 hours.  Invalid input(s): "PCO2", "PO2" PSA's reviewed.   Latest Reference Range & Units 05/23/17 15:19 09/25/18 10:01 05/31/20 12:08 02/21/21 15:18 12/06/21 13:55 06/06/22 13:46  PSA 0.10 - 4.00 ng/mL 15.80 (H) 14.05 (  H) 13.19 (H) 11.92 (H) 13.19 (H) 16.56 (H)  (H): Data is abnormally high  Studies/Results: No results found.   Assessment/Plan: Elevated PSA.  His PSA is up to 16.5 but has been fairly stable for the last 5 years. He has some prostate induration.  I will get him set up for an MRI of the prostate.  I reviewed the risks of bleeding, infection and voiding difficulty should he need a biopsy.  I will order a repeat PSA and OV in 6 months.     BPH with BOO.  He has moderate LUTS on tamsulosin.  ED.  He has only partial response to sildenafil.  No orders of the defined types were placed in this encounter.    Orders Placed This Encounter  Procedures   Microscopic Examination   MR PROSTATE W WO  CONTRAST    Standing Status:   Future    Standing Expiration Date:   07/13/2023    Order Specific Question:   If indicated for the ordered procedure, I authorize the administration of contrast media per Radiology protocol    Answer:   Yes    Order Specific Question:   What is the patient's sedation requirement?    Answer:   No Sedation    Order Specific Question:   Does the patient have a pacemaker or implanted devices?    Answer:   No    Order Specific Question:   Radiology Contrast Protocol - do NOT remove file path    Answer:   [email or web address]    Order Specific Question:   Preferred imaging location?    Answer:   [facility] (table limit-550lbs)   Urinalysis, Routine w reflex microscopic   PSA, total and free    Standing Status:   Future    Standing Expiration Date:   07/13/2023     Return in about 6 months (around 01/12/2023) for with a PSA.Marland Kitchen    CC: Dr. Trinna Post Plotnikov.      Bjorn Pippin 07/15/2022

## 2022-07-14 LAB — URINALYSIS, ROUTINE W REFLEX MICROSCOPIC
Bilirubin, UA: NEGATIVE
Glucose, UA: NEGATIVE
Ketones, UA: NEGATIVE
Nitrite, UA: NEGATIVE
Protein,UA: NEGATIVE
RBC, UA: NEGATIVE
Specific Gravity, UA: 1.02 (ref 1.005–1.030)
Urobilinogen, Ur: 0.2 mg/dL (ref 0.2–1.0)
pH, UA: 5.5 (ref 5.0–7.5)

## 2022-07-14 LAB — MICROSCOPIC EXAMINATION: Bacteria, UA: NONE SEEN

## 2022-07-18 ENCOUNTER — Other Ambulatory Visit: Payer: Self-pay | Admitting: Internal Medicine

## 2022-07-18 DIAGNOSIS — Z0000 Encounter for general adult medical examination without abnormal findings: Secondary | ICD-10-CM

## 2022-07-18 MED ORDER — ALBUTEROL SULFATE HFA 108 (90 BASE) MCG/ACT IN AERS: 2.0000 | INHALATION_SPRAY | RESPIRATORY_TRACT | 3 refills | Status: AC | PRN

## 2022-07-18 MED ORDER — SILDENAFIL CITRATE 100 MG PO TABS
ORAL_TABLET | ORAL | 0 refills | Status: DC
Start: 2022-07-18 — End: 2022-12-19

## 2022-07-18 MED ORDER — CYCLOBENZAPRINE HCL 5 MG PO TABS: 5.0000 mg | ORAL_TABLET | Freq: Two times a day (BID) | ORAL | 0 refills | Status: DC | PRN

## 2022-09-14 ENCOUNTER — Encounter (INDEPENDENT_AMBULATORY_CARE_PROVIDER_SITE_OTHER): Payer: Self-pay

## 2022-09-20 ENCOUNTER — Ambulatory Visit
Admission: RE | Admit: 2022-09-20 | Discharge: 2022-09-20 | Disposition: A | Discharge status: Home / Self Care | Payer: Medicare Other | Source: Ambulatory Visit | Attending: Urology | Admitting: Urology

## 2022-09-20 DIAGNOSIS — R972 Elevated prostate specific antigen [PSA]: Secondary | ICD-10-CM

## 2022-09-20 MED ORDER — GADOPICLENOL 0.5 MMOL/ML IV SOLN
7.0000 mL | Freq: Once | INTRAVENOUS | Status: AC | PRN
  Administered 2022-09-20: 7 mL via INTRAVENOUS

## 2022-10-05 ENCOUNTER — Ambulatory Visit (INDEPENDENT_AMBULATORY_CARE_PROVIDER_SITE_OTHER): Payer: Medicare Other | Admitting: Urology

## 2022-10-05 VITALS — BP 126/83 | HR 60 | Ht 67.0 in | Wt 151.0 lb

## 2022-10-05 DIAGNOSIS — N401 Enlarged prostate with lower urinary tract symptoms: Secondary | ICD-10-CM | POA: Diagnosis not present

## 2022-10-05 DIAGNOSIS — R350 Frequency of micturition: Secondary | ICD-10-CM

## 2022-10-05 DIAGNOSIS — N138 Other obstructive and reflux uropathy: Secondary | ICD-10-CM

## 2022-10-05 DIAGNOSIS — R9349 Abnormal radiologic findings on diagnostic imaging of other urinary organs: Secondary | ICD-10-CM | POA: Diagnosis not present

## 2022-10-05 DIAGNOSIS — R972 Elevated prostate specific antigen [PSA]: Secondary | ICD-10-CM | POA: Diagnosis not present

## 2022-10-05 DIAGNOSIS — R3912 Poor urinary stream: Secondary | ICD-10-CM | POA: Diagnosis not present

## 2022-10-05 MED ORDER — LEVOFLOXACIN 750 MG PO TABS: ORAL_TABLET | ORAL | 0 refills | Status: DC

## 2022-10-05 MED ORDER — TADALAFIL 20 MG PO TABS: 20.0000 mg | ORAL_TABLET | Freq: Every day | ORAL | 3 refills | Status: DC | PRN

## 2022-10-05 NOTE — Patient Instructions (Signed)
    Prostate Biopsy Instructions  Stop all aspirin or blood thinners (aspirin, plavix, coumadin, warfarin, motrin, ibuprofen, advil, aleve, naproxen, naprosyn) for 7 days prior to the procedure.  If you have any questions about stopping these medications, please contact your primary care physician or cardiologist.  Having a light meal prior to the procedure is recommended.  If you are diabetic or have low blood sugar please bring a small snack or glucose tablet.  A Fleets enema is needed to be purchased over the counter at a local pharmacy and used 2 hours before you scheduled appointment.  This can be purchased over the counter at any pharmacy.  Antibiotics will be administered in the clinic at the time of the procedure and 1 tablet has been sent to your pharmacy. Please take the antibiotic as prescribed.    Please bring someone with you to the procedure to drive you home if you are given a valium to take prior to your procedure.   If you have any questions or concerns, please feel free to call the office at [phone] or send a Mychart message.

## 2022-10-05 NOTE — Progress Notes (Signed)
Subjective: 1. Elevated PSA   2. Abnormal urologic system diagnostic imaging   3. BPH with urinary obstruction   4. Urinary frequency   5. Weak urinary stream      Consult requested by Dr. Trinna Post Plotnikov.  10/05/22: [name] returns today in f/u from his recent MRIP that showed a PIRADS 3 lesion on the left posterior medial PZ in the mid and apical prostate.  The prostate was 64ml.  His IPSS is 24 on tamsulosin.   6/13/24[name] Hx: [name] is a former patient of Dr. Retta Diones who is sent back for a PSA of 16.56.  He had a negative biopsy in 2015 when his PSA was up to 10.12.  His prostate was 60ml at that time.  He has BPH with BOO and has an IPSS of 19 on tamuslosin. He has nocturia x 3.  He is on sildenafil for ED with some response.  He has had no hematuria or dysuria.  He has had no UTI's.     IPSS     Row Name 10/05/22 1300         International Prostate Symptom Score   How often have you had the sensation of not emptying your bladder? More than half the time     How often have you had to urinate less than every two hours? About half the time     How often have you found you stopped and started again several times when you urinated? About half the time     How often have you found it difficult to postpone urination? About half the time     How often have you had a weak urinary stream? Almost always     How often have you had to strain to start urination? About half the time     How many times did you typically get up at night to urinate? 3 Times     Total IPSS Score 24       Quality of Life due to urinary symptoms   If you were to spend the rest of your life with your urinary condition just the way it is now how would you feel about that? Mixed               ROS:  Review of Systems  HENT:  Positive for congestion.   Gastrointestinal:  Positive for constipation and diarrhea.  Musculoskeletal:  Positive for back pain and joint pain.  Neurological:  Positive for  dizziness.  Psychiatric/Behavioral:  Positive for memory loss.     Allergies  Allergen Reactions   Penicillins Itching and Swelling   Sulfonamide Derivatives Itching and Swelling   Varenicline Tartrate Other (See Comments)    states that he had nightmares and felt crazy    Aricept [Donepezil]     Upset stomach   Tramadol Other (See Comments)    dizzy    Past Medical History:  Diagnosis Date   Anxiety    Arthritis    Back pain, chronic     Past Surgical History:  Procedure Laterality Date   COLONOSCOPY W/ POLYPECTOMY     LEG SURGERY      Social History   Socioeconomic History   Marital status: Legally Separated    Spouse name: Not on file   Number of children: Not on file   Years of education: Not on file   Highest education level: Not on file  Occupational History   Not on file  Tobacco Use  Smoking status: Former    Current packs/day: 0.00    Average packs/day: 1 pack/day for 19.0 years (19.0 ttl pk-yrs)    Types: Cigarettes    Start date: 12/24/2001    Quit date: 12/24/2020    Years since quitting: 1.7   Smokeless tobacco: Former  Substance and Sexual Activity   Alcohol use: No   Drug use: No   Sexual activity: Yes  Other Topics Concern   Not on file  Social History Narrative   Not on file   Social Determinants of Health   Financial Resource Strain: Low Risk  (12/05/2021)   Overall Financial Resource Strain (CARDIA)    Difficulty of Paying Living Expenses: Not hard at all  Food Insecurity: No Food Insecurity (12/05/2021)   Hunger Vital Sign    Worried About Running Out of Food in the Last Year: Never true    Ran Out of Food in the Last Year: Never true  Transportation Needs: No Transportation Needs (12/05/2021)   PRAPARE - Administrator, Civil Service (Medical): No    Lack of Transportation (Non-Medical): No  Physical Activity: Sufficiently Active (12/05/2021)   Exercise Vital Sign    Days of Exercise per Week: 5 days    Minutes of  Exercise per Session: 30 min  Stress: No Stress Concern Present (12/05/2021)   [employer] of Occupational Health - Occupational Stress Questionnaire    Feeling of Stress : Not at all  Social Connections: Moderately Integrated (12/05/2021)   Social Connection and Isolation Panel [NHANES]    Frequency of Communication with Friends and Family: More than three times a week    Frequency of Social Gatherings with Friends and Family: More than three times a week    Attends Religious Services: More than 4 times per year    Active Member of [employer] or Organizations: Yes    Attends Banker Meetings: More than 4 times per year    Marital Status: Separated  Intimate Partner Violence: Not At Risk (12/05/2021)   Humiliation, Afraid, Rape, and Kick questionnaire    Fear of Current or Ex-Partner: No    Emotionally Abused: No    Physically Abused: No    Sexually Abused: No    Family History  Problem Relation Age of Onset   Cancer Mother 62       lung ca   Cancer Father 82       glioblastoma    Anti-infectives: Anti-infectives (From admission, onward)    Start     Dose/Rate Route Frequency Ordered Stop   10/05/22 0000  levofloxacin (LEVAQUIN) 750 MG tablet           10/05/22 1340         Current Outpatient Medications  Medication Sig Dispense Refill   albuterol (PROAIR HFA) 108 (90 Base) MCG/ACT inhaler Inhale 2 puffs into the lungs every 4 (four) hours as needed for wheezing or shortness of breath. 54 g 3   ALPRAZolam (XANAX) 0.5 MG tablet Take 1 tablet (0.5 mg total) by mouth 2 (two) times daily as needed. for anxiety 180 tablet 1   B Complex-C (B-COMPLEX WITH VITAMIN C) tablet Take 1 tablet by mouth daily. 90 tablet 3   Cholecalciferol (VITAMIN D) 2000 units CAPS 1 po qd 100 capsule 3   cyclobenzaprine (FLEXERIL) 5 MG tablet Take 1 tablet (5 mg total) by mouth 2 (two) times daily as needed for muscle spasms. 180 tablet 0   famotidine (PEPCID) 40 MG  tablet Take 1 tablet  (40 mg total) by mouth daily. 90 tablet 3   fexofenadine-pseudoephedrine (ALLEGRA-D ALLERGY & CONGESTION) 180-240 MG 24 hr tablet Take 1 tablet by mouth daily as needed. 90 tablet 1   hydrOXYzine (ATARAX/VISTARIL) 25 MG tablet Take 1 tablet (25 mg total) by mouth every 8 (eight) hours as needed for itching. 60 tablet 0   hyoscyamine (LEVSIN) 0.125 MG tablet Take 1-2 tablets (0.125-0.25 mg total) by mouth every 4 (four) hours as needed. 120 tablet 1   levofloxacin (LEVAQUIN) 750 MG tablet Take 1 tab po 1 hour prior to procedure. 1 tablet 0   magnesium gluconate (MAGONATE) 500 MG tablet Take 250 mg by mouth daily. Uses typically once weekly - helps with his sleep     memantine (NAMENDA) 5 MG tablet Take 1 tablet (5 mg total) by mouth 2 (two) times daily. 180 tablet 3   nortriptyline (PAMELOR) 10 MG capsule TAKE 1 TO 2 CAPSULES BY MOUTH  ONCE DAILY AT BEDTIME 180 capsule 1   pseudoephedrine (SUDAFED) 120 MG 12 hr tablet Take 1 tablet (120 mg total) by mouth every 12 (twelve) hours as needed for congestion. 180 tablet 1   sildenafil (VIAGRA) 100 MG tablet TAKE 1 TABLET BY MOUTH DAILY AS  NEEDED FOR ERECTILE DYSFUNCTION 12 tablet 0   tadalafil (CIALIS) 20 MG tablet Take 1 tablet (20 mg total) by mouth daily as needed for erectile dysfunction. If that isn't effective, you can take 1/2 tab of tadalafil with 1/2 tab of sildenafil 100mg  together. 12 tablet 3   tamsulosin (FLOMAX) 0.4 MG CAPS capsule Take 1 capsule (0.4 mg total) by mouth daily. 90 capsule 3   Turmeric (QC TUMERIC COMPLEX PO) Take by mouth.     umeclidinium bromide (INCRUSE ELLIPTA) 62.5 MCG/ACT AEPB Inhale 1 puff into the lungs daily. 90 each 1   umeclidinium bromide (INCRUSE ELLIPTA) 62.5 MCG/INH AEPB Inhale 1 puff into the lungs daily. 3 each 3   vitamin C (ASCORBIC ACID) 500 MG tablet Take 500 mg by mouth daily.     No current facility-administered medications for this visit.     Objective: Vital signs in last 24 hours: BP 126/83    Pulse 60   Ht 5\' 7"  (1.702 m)   Wt 151 lb (68.5 kg)   BMI 23.65 kg/m   Intake/Output from previous day: No intake/output data recorded. Intake/Output this shift: @IOTHISSHIFT @   Physical Exam Vitals reviewed.  Constitutional:      Appearance: Normal appearance.  Neurological:     Mental Status: He is alert.     Lab Results:  No results found for this or any previous visit (from the past 24 hour(s)).  BMET No results for input(s): "NA", "K", "CL", "CO2", "GLUCOSE", "BUN", "CREATININE", "CALCIUM" in the last 72 hours. PT/INR No results for input(s): "LABPROT", "INR" in the last 72 hours. ABG No results for input(s): "PHART", "HCO3" in the last 72 hours.  Invalid input(s): "PCO2", "PO2" PSA's reviewed.   Latest Reference Range & Units 05/23/17 15:19 09/25/18 10:01 05/31/20 12:08 02/21/21 15:18 12/06/21 13:55 06/06/22 13:46  PSA 0.10 - 4.00 ng/mL 15.80 (H) 14.05 (H) 13.19 (H) 11.92 (H) 13.19 (H) 16.56 (H)  (H): Data is abnormally high  Studies/Results: No results found. MR PROSTATE W WO CONTRAST  Result Date: 09/21/2022 CLINICAL DATA:  Elevated PSA level of 16.56. EXAM: MR PROSTATE WITHOUT AND WITH CONTRAST TECHNIQUE: Multiplanar multisequence MRI images were obtained of the pelvis centered about the prostate. Pre and  post contrast images were obtained. CONTRAST:  7 cc Vueway COMPARISON:  None Available. FINDINGS: The patient appears to have moved substantially between initial and scout images and the T2 space UroNAV sequence, resulting in exclusion of through tip of the apex on the UroNAV sequences. The small excluded region does not contain a significant lesion based on other imaging sequences including coronal T2 weighted images. Prostate: Hazing nonfocal low T2 signal in the right peripheral zone is likely postinflammatory considered PI-RADS category 2. Region of interest # 1: PI-RADS category 3 lesion of the left posteromedial peripheral zone in the mid gland and apex with  mostly bandlike low T2 signal (image 20, series 8) corresponding to reduced ADC map activity (image 20, series 6) this measures 0.48 cc (1.3 by 0.6 by 1.0 cm). Encapsulated nodularity in the transition zone compatible with benign prostatic hypertrophy. Volume: 3D volumetric analysis: Prostate volume 64.01 cc (5.6 by 4.7 by 5.0 cm). Transcapsular spread:  Absent Seminal vesicle involvement: Absent Neurovascular bundle involvement: Absent Pelvic adenopathy: Absent Bone metastasis: Absent Other findings: Sigmoid colon diverticulosis. IMPRESSION: 1. PI-RADS category 3 lesion of the left posteromedial peripheral zone in the mid gland and apex. Targeting data sent to UroNAV. 2. Benign prostatic hypertrophy and prostatomegaly. 3. Sigmoid colon diverticulosis. Electronically Signed   By: Gaylyn Rong M.D.   On: 09/21/2022 08:52     Assessment/Plan: Elevated PSA.  His PSA is up to 16.5 but has been fairly stable for the last 5 years. He has some prostate induration and a left PIRADS 3 lesion.  I will get him set up for an MR fusion biopsy.  I have reviewed the risks of bleeding, infection and voiding difficulty.  I sent Levaquin.   BPH with BOO.  He has moderate LUTS on tamsulosin.  We can consider further intervention if the biopsy is negative.   ED.  He has only partial response to sildenafil.  I will try him on combination therapy with tadalafil 10mg  and sildenafil 50mg .  Meds ordered this encounter  Medications   levofloxacin (LEVAQUIN) 750 MG tablet    Sig: Take 1 tab po 1 hour prior to procedure.    Dispense:  1 tablet    Refill:  0   tadalafil (CIALIS) 20 MG tablet    Sig: Take 1 tablet (20 mg total) by mouth daily as needed for erectile dysfunction. If that isn't effective, you can take 1/2 tab of tadalafil with 1/2 tab of sildenafil 100mg  together.    Dispense:  12 tablet    Refill:  3     Orders Placed This Encounter  Procedures   Urinalysis, Routine w reflex microscopic   Ambulatory  referral to Urology    Referral Priority:   Routine    Referral Type:   Consultation    Referral Reason:   Specialty Services Required    Referred to Provider:   Bjorn Pippin, MD    Requested Specialty:   Urology    Number of Visits Requested:   1     Return for f/u with results of MR fusion biopsy. .    CC: Dr. Trinna Post Plotnikov.      Bjorn Pippin 10/05/2022

## 2022-10-09 ENCOUNTER — Ambulatory Visit (INDEPENDENT_AMBULATORY_CARE_PROVIDER_SITE_OTHER): Payer: Medicare Other | Admitting: Internal Medicine

## 2022-10-09 ENCOUNTER — Encounter: Payer: Self-pay | Admitting: Internal Medicine

## 2022-10-09 VITALS — BP 118/68 | HR 66 | Temp 98.6°F | Ht 67.0 in | Wt 149.0 lb

## 2022-10-09 DIAGNOSIS — M5416 Radiculopathy, lumbar region: Secondary | ICD-10-CM

## 2022-10-09 DIAGNOSIS — Z0000 Encounter for general adult medical examination without abnormal findings: Secondary | ICD-10-CM

## 2022-10-09 DIAGNOSIS — F411 Generalized anxiety disorder: Secondary | ICD-10-CM | POA: Diagnosis not present

## 2022-10-09 DIAGNOSIS — K760 Fatty (change of) liver, not elsewhere classified: Secondary | ICD-10-CM | POA: Diagnosis not present

## 2022-10-09 DIAGNOSIS — R413 Other amnesia: Secondary | ICD-10-CM | POA: Diagnosis not present

## 2022-10-09 DIAGNOSIS — R972 Elevated prostate specific antigen [PSA]: Secondary | ICD-10-CM | POA: Diagnosis not present

## 2022-10-09 DIAGNOSIS — K029 Dental caries, unspecified: Secondary | ICD-10-CM

## 2022-10-09 NOTE — Progress Notes (Signed)
Subjective:  Patient ID: Joseph Preston, male    DOB: 02/18/1950  Age: 72 y.o. MRN: [id number]  CC: Follow-up (4 mnth f/u)   HPI Joseph Preston presents for anxiety, IBS, elevated PSA Working  w/a safety crew 5 d/wk  Outpatient Medications Prior to Visit  Medication Sig Dispense Refill   albuterol (PROAIR HFA) 108 (90 Base) MCG/ACT inhaler Inhale 2 puffs into the lungs every 4 (four) hours as needed for wheezing or shortness of breath. 54 g 3   ALPRAZolam (XANAX) 0.5 MG tablet Take 1 tablet (0.5 mg total) by mouth 2 (two) times daily as needed. for anxiety 180 tablet 1   B Complex-C (B-COMPLEX WITH VITAMIN C) tablet Take 1 tablet by mouth daily. 90 tablet 3   Cholecalciferol (VITAMIN D) 2000 units CAPS 1 po qd 100 capsule 3   cyclobenzaprine (FLEXERIL) 5 MG tablet Take 1 tablet (5 mg total) by mouth 2 (two) times daily as needed for muscle spasms. 180 tablet 0   famotidine (PEPCID) 40 MG tablet Take 1 tablet (40 mg total) by mouth daily. 90 tablet 3   fexofenadine-pseudoephedrine (ALLEGRA-D ALLERGY & CONGESTION) 180-240 MG 24 hr tablet Take 1 tablet by mouth daily as needed. 90 tablet 1   hydrOXYzine (ATARAX/VISTARIL) 25 MG tablet Take 1 tablet (25 mg total) by mouth every 8 (eight) hours as needed for itching. 60 tablet 0   hyoscyamine (LEVSIN) 0.125 MG tablet Take 1-2 tablets (0.125-0.25 mg total) by mouth every 4 (four) hours as needed. 120 tablet 1   magnesium gluconate (MAGONATE) 500 MG tablet Take 250 mg by mouth daily. Uses typically once weekly - helps with his sleep     memantine (NAMENDA) 5 MG tablet Take 1 tablet (5 mg total) by mouth 2 (two) times daily. 180 tablet 3   nortriptyline (PAMELOR) 10 MG capsule TAKE 1 TO 2 CAPSULES BY MOUTH  ONCE DAILY AT BEDTIME 180 capsule 1   pseudoephedrine (SUDAFED) 120 MG 12 hr tablet Take 1 tablet (120 mg total) by mouth every 12 (twelve) hours as needed for congestion. 180 tablet 1   sildenafil (VIAGRA) 100 MG tablet TAKE 1 TABLET BY  MOUTH DAILY AS  NEEDED FOR ERECTILE DYSFUNCTION 12 tablet 0   tadalafil (CIALIS) 20 MG tablet Take 1 tablet (20 mg total) by mouth daily as needed for erectile dysfunction. If that isn't effective, you can take 1/2 tab of tadalafil with 1/2 tab of sildenafil 100mg  together. 12 tablet 3   tamsulosin (FLOMAX) 0.4 MG CAPS capsule Take 1 capsule (0.4 mg total) by mouth daily. 90 capsule 3   Turmeric (QC TUMERIC COMPLEX PO) Take by mouth.     umeclidinium bromide (INCRUSE ELLIPTA) 62.5 MCG/ACT AEPB Inhale 1 puff into the lungs daily. 90 each 1   umeclidinium bromide (INCRUSE ELLIPTA) 62.5 MCG/INH AEPB Inhale 1 puff into the lungs daily. 3 each 3   vitamin C (ASCORBIC ACID) 500 MG tablet Take 500 mg by mouth daily.     levofloxacin (LEVAQUIN) 750 MG tablet Take 1 tab po 1 hour prior to procedure. 1 tablet 0   No facility-administered medications prior to visit.    ROS: Review of Systems  Constitutional:  Negative for appetite change, fatigue and unexpected weight change.  HENT:  Negative for congestion, nosebleeds, sneezing, sore throat and trouble swallowing.   Eyes:  Negative for itching and visual disturbance.  Respiratory:  Negative for cough.   Cardiovascular:  Negative for chest pain, palpitations and leg swelling.  Gastrointestinal:  Positive for diarrhea. Negative for abdominal distention, blood in stool and nausea.  Genitourinary:  Negative for frequency and hematuria.  Musculoskeletal:  Negative for back pain, gait problem, joint swelling and neck pain.  Skin:  Negative for rash.  Neurological:  Negative for dizziness, tremors, speech difficulty and weakness.  Psychiatric/Behavioral:  Negative for agitation, dysphoric mood and sleep disturbance. The patient is nervous/anxious.     Objective:  BP 118/68 (BP Location: Right Arm, Patient Position: Sitting, Cuff Size: Large)   Pulse 66   Temp 98.6 F (37 C) (Oral)   Ht 5\' 7"  (1.702 m)   Wt 149 lb (67.6 kg)   SpO2 95%   BMI 23.34  kg/m   BP Readings from Last 3 Encounters:  10/09/22 118/68  10/05/22 126/83  07/13/22 121/78    Wt Readings from Last 3 Encounters:  10/09/22 149 lb (67.6 kg)  10/05/22 151 lb (68.5 kg)  07/13/22 151 lb (68.5 kg)    Physical Exam Constitutional:      General: He is not in acute distress.    Appearance: Normal appearance. He is well-developed.     Comments: NAD  Eyes:     Conjunctiva/sclera: Conjunctivae normal.     Pupils: Pupils are equal, round, and reactive to light.  Neck:     Thyroid: No thyromegaly.     Vascular: No JVD.  Cardiovascular:     Rate and Rhythm: Normal rate and regular rhythm.     Heart sounds: Normal heart sounds. No murmur heard.    No friction rub. No gallop.  Pulmonary:     Effort: Pulmonary effort is normal. No respiratory distress.     Breath sounds: Normal breath sounds. No wheezing or rales.  Chest:     Chest wall: No tenderness.  Abdominal:     General: Bowel sounds are normal. There is no distension.     Palpations: Abdomen is soft. There is no mass.     Tenderness: There is no abdominal tenderness. There is no guarding or rebound.  Musculoskeletal:        General: No tenderness. Normal range of motion.     Cervical back: Normal range of motion.  Lymphadenopathy:     Cervical: No cervical adenopathy.  Skin:    General: Skin is warm and dry.     Findings: No rash.  Neurological:     Mental Status: He is alert and oriented to person, place, and time.     Cranial Nerves: No cranial nerve deficit.     Motor: No abnormal muscle tone.     Coordination: Coordination normal.     Gait: Gait normal.     Deep Tendon Reflexes: Reflexes are normal and symmetric.  Psychiatric:        Behavior: Behavior normal.        Thought Content: Thought content normal.        Judgment: Judgment normal.     Lab Results  Component Value Date   WBC 5.8 06/28/2022   HGB 14.4 06/28/2022   HCT 43.5 06/28/2022   PLT 223.0 06/28/2022   GLUCOSE 129 (H)  06/28/2022   CHOL 233 (H) 12/06/2021   TRIG 69.0 12/06/2021   HDL 89.70 12/06/2021   LDLCALC 129 (H) 12/06/2021   ALT 18 06/28/2022   AST 19 06/28/2022   NA 137 06/28/2022   K 4.7 06/28/2022   CL 102 06/28/2022   CREATININE 0.93 06/28/2022   BUN 15 06/28/2022   CO2 31  06/28/2022   TSH 0.84 06/28/2022   PSA 16.56 (H) 06/06/2022   HGBA1C 5.7 03/10/2016    MR PROSTATE W WO CONTRAST  Result Date: 09/21/2022 CLINICAL DATA:  Elevated PSA level of 16.56. EXAM: MR PROSTATE WITHOUT AND WITH CONTRAST TECHNIQUE: Multiplanar multisequence MRI images were obtained of the pelvis centered about the prostate. Pre and post contrast images were obtained. CONTRAST:  7 cc Vueway COMPARISON:  None Available. FINDINGS: The patient appears to have moved substantially between initial and scout images and the T2 space UroNAV sequence, resulting in exclusion of through tip of the apex on the UroNAV sequences. The small excluded region does not contain a significant lesion based on other imaging sequences including coronal T2 weighted images. Prostate: Hazing nonfocal low T2 signal in the right peripheral zone is likely postinflammatory considered PI-RADS category 2. Region of interest # 1: PI-RADS category 3 lesion of the left posteromedial peripheral zone in the mid gland and apex with mostly bandlike low T2 signal (image 20, series 8) corresponding to reduced ADC map activity (image 20, series 6) this measures 0.48 cc (1.3 by 0.6 by 1.0 cm). Encapsulated nodularity in the transition zone compatible with benign prostatic hypertrophy. Volume: 3D volumetric analysis: Prostate volume 64.01 cc (5.6 by 4.7 by 5.0 cm). Transcapsular spread:  Absent Seminal vesicle involvement: Absent Neurovascular bundle involvement: Absent Pelvic adenopathy: Absent Bone metastasis: Absent Other findings: Sigmoid colon diverticulosis. IMPRESSION: 1. PI-RADS category 3 lesion of the left posteromedial peripheral zone in the mid gland and apex.  Targeting data sent to UroNAV. 2. Benign prostatic hypertrophy and prostatomegaly. 3. Sigmoid colon diverticulosis. Electronically Signed   By: Gaylyn Rong M.D.   On: 09/21/2022 08:52    Assessment & Plan:   Problem List Items Addressed This Visit     Generalized anxiety disorder - Primary    Chronic  Prn Xanax  Potential benefits of a long term benzodiazepines  use as well as potential risks  and complications were explained to the patient and were aknowledged.       Lumbar radiculopathy, chronic    No change Flexeril       Elevated PSA    Prostate bx is pending F/u w/Dr Annabell Howells      Dental caries extending into pulp    Edentulous since 2005 upper  2019 - complete dentures - not using      Memory problem     On Namenda       Fatty liver    On a good diet         No orders of the defined types were placed in this encounter.     Follow-up: Return in about 3 months (around 01/08/2023) for a follow-up visit.  Sonda Primes, MD

## 2022-10-09 NOTE — Assessment & Plan Note (Signed)
No change Flexeril

## 2022-10-09 NOTE — Assessment & Plan Note (Signed)
Edentulous since 2005 upper  2019 - complete dentures - not using

## 2022-10-09 NOTE — Assessment & Plan Note (Signed)
Chronic  Prn Xanax  Potential benefits of a long term benzodiazepines  use as well as potential risks  and complications were explained to the patient and were aknowledged.

## 2022-10-09 NOTE — Assessment & Plan Note (Signed)
Prostate bx is pending F/u w/Dr Annabell Howells

## 2022-10-09 NOTE — Assessment & Plan Note (Signed)
On Namenda 

## 2022-10-09 NOTE — Assessment & Plan Note (Signed)
On a good diet

## 2022-11-09 ENCOUNTER — Telehealth: Payer: Self-pay

## 2022-11-09 NOTE — Telephone Encounter (Signed)
Patient left a voice message 11-09-2022.  Calling to check on status of biopsy to be scheduled.  Please advise.  Call:  [phone]

## 2022-11-09 NOTE — Telephone Encounter (Signed)
Patient made aware the procedure would be done at [facility]. Patient states he will contact them.

## 2022-12-18 ENCOUNTER — Other Ambulatory Visit: Payer: Self-pay | Admitting: Internal Medicine

## 2022-12-18 DIAGNOSIS — Z0000 Encounter for general adult medical examination without abnormal findings: Secondary | ICD-10-CM

## 2022-12-29 ENCOUNTER — Other Ambulatory Visit: Payer: Self-pay | Admitting: Internal Medicine

## 2023-01-01 ENCOUNTER — Other Ambulatory Visit: Payer: Medicare Other

## 2023-01-01 DIAGNOSIS — R972 Elevated prostate specific antigen [PSA]: Secondary | ICD-10-CM

## 2023-01-02 LAB — PSA, TOTAL AND FREE
PSA, Free Pct: 12.8 %
PSA, Free: 3.45 ng/mL
Prostate Specific Ag, Serum: 26.9 ng/mL — ABNORMAL HIGH (ref 0.0–4.0)

## 2023-01-11 ENCOUNTER — Encounter: Payer: Self-pay | Admitting: Urology

## 2023-01-11 ENCOUNTER — Ambulatory Visit: Payer: Medicare Other | Admitting: Urology

## 2023-01-11 VITALS — BP 138/88 | HR 67

## 2023-01-11 DIAGNOSIS — N5201 Erectile dysfunction due to arterial insufficiency: Secondary | ICD-10-CM

## 2023-01-11 DIAGNOSIS — R972 Elevated prostate specific antigen [PSA]: Secondary | ICD-10-CM

## 2023-01-11 DIAGNOSIS — N138 Other obstructive and reflux uropathy: Secondary | ICD-10-CM

## 2023-01-11 DIAGNOSIS — C61 Malignant neoplasm of prostate: Secondary | ICD-10-CM

## 2023-01-11 DIAGNOSIS — R3912 Poor urinary stream: Secondary | ICD-10-CM

## 2023-01-11 DIAGNOSIS — N401 Enlarged prostate with lower urinary tract symptoms: Secondary | ICD-10-CM

## 2023-01-11 DIAGNOSIS — R350 Frequency of micturition: Secondary | ICD-10-CM

## 2023-01-11 LAB — URINALYSIS, ROUTINE W REFLEX MICROSCOPIC
Bilirubin, UA: NEGATIVE
Glucose, UA: NEGATIVE
Ketones, UA: NEGATIVE
Leukocytes,UA: NEGATIVE
Nitrite, UA: NEGATIVE
Protein,UA: NEGATIVE
Specific Gravity, UA: 1.01 (ref 1.005–1.030)
Urobilinogen, Ur: 0.2 mg/dL (ref 0.2–1.0)
pH, UA: 6 (ref 5.0–7.5)

## 2023-01-11 LAB — MICROSCOPIC EXAMINATION
Bacteria, UA: NONE SEEN
WBC, UA: NONE SEEN /[HPF] (ref 0–5)

## 2023-01-11 MED ORDER — FINASTERIDE 5 MG PO TABS
5.0000 mg | ORAL_TABLET | Freq: Every day | ORAL | 3 refills | Status: DC
Start: 2023-01-11 — End: 2023-11-21

## 2023-01-11 NOTE — Progress Notes (Signed)
Subjective: 1. Prostate cancer (HCC)   2. Elevated PSA   3. BPH with urinary obstruction   4. Urinary frequency   5. Weak urinary stream   6. Erectile dysfunction due to arterial insufficiency      Consult requested by Dr. Sonda Primes.  01/11/23: Joseph Preston returns today in f/u from an MR fusion biopsy.  He had a 77ml prostate with GG1 in 10% of the RBL core and the 2 ROI's are negative. His last PSA was up to 27 on 01/01/20 but he had just had the biopsy 2 weeks prior.  His IPSS is 24.  He is currently on tamsulosin.  He is using sildenafil with some benefit.  His libido is down some.   10/05/22: Mr. Corts returns today in f/u from his recent MRIP that showed a PIRADS 3 lesion on the left posterior medial PZ in the mid and apical prostate.  The prostate was 64ml.  His IPSS is 24 on tamsulosin.   6/13/24[name] Hx: Mr. Marciante is a former patient of Dr. Retta Diones who is sent back for a PSA of 16.56.  He had a negative biopsy in 2015 when his PSA was up to 10.12.  His prostate was 60ml at that time.  He has BPH with BOO and has an IPSS of 19 on tamuslosin. He has nocturia x 3.  He is on sildenafil for ED with some response.  He has had no hematuria or dysuria.  He has had no UTI's.     IPSS     Row Name 01/11/23 1400         International Prostate Symptom Score   How often have you had the sensation of not emptying your bladder? More than half the time     How often have you had to urinate less than every two hours? More than half the time     How often have you found you stopped and started again several times when you urinated? More than half the time     How often have you found it difficult to postpone urination? More than half the time     How often have you had a weak urinary stream? More than half the time     How often have you had to strain to start urination? Less than 1 in 5 times     How many times did you typically get up at night to urinate? 3 Times     Total IPSS Score 24        Quality of Life due to urinary symptoms   If you were to spend the rest of your life with your urinary condition just the way it is now how would you feel about that? Mixed                ROS:  Review of Systems  Constitutional:  Positive for malaise/fatigue.  HENT:  Positive for congestion.   Eyes:  Positive for blurred vision.  Respiratory:  Positive for shortness of breath.   Gastrointestinal:  Positive for constipation and diarrhea.  Musculoskeletal:  Positive for back pain and joint pain.  Neurological:  Positive for dizziness and weakness.  Psychiatric/Behavioral:  Positive for memory loss.   All other systems reviewed and are negative.   Allergies  Allergen Reactions   Penicillins Itching and Swelling   Sulfonamide Derivatives Itching and Swelling   Varenicline Tartrate Other (See Comments)    states that he had nightmares and felt crazy  Aricept [Donepezil]     Upset stomach   Tramadol Other (See Comments)    dizzy    Past Medical History:  Diagnosis Date   Anxiety    Arthritis    Back pain, chronic     Past Surgical History:  Procedure Laterality Date   COLONOSCOPY W/ POLYPECTOMY     LEG SURGERY      Social History   Socioeconomic History   Marital status: Legally Separated    Spouse name: Not on file   Number of children: Not on file   Years of education: Not on file   Highest education level: Not on file  Occupational History   Not on file  Tobacco Use   Smoking status: Former    Current packs/day: 0.00    Average packs/day: 1 pack/day for 19.0 years (19.0 ttl pk-yrs)    Types: Cigarettes    Start date: 12/24/2001    Quit date: 12/24/2020    Years since quitting: 2.0   Smokeless tobacco: Former  Substance and Sexual Activity   Alcohol use: No   Drug use: No   Sexual activity: Yes  Other Topics Concern   Not on file  Social History Narrative   Not on file   Social Drivers of Health   Financial Resource Strain: Low Risk   (12/05/2021)   Overall Financial Resource Strain (CARDIA)    Difficulty of Paying Living Expenses: Not hard at all  Food Insecurity: No Food Insecurity (12/05/2021)   Hunger Vital Sign    Worried About Running Out of Food in the Last Year: Never true    Ran Out of Food in the Last Year: Never true  Transportation Needs: No Transportation Needs (12/05/2021)   PRAPARE - Administrator, Civil Service (Medical): No    Lack of Transportation (Non-Medical): No  Physical Activity: Sufficiently Active (12/05/2021)   Exercise Vital Sign    Days of Exercise per Week: 5 days    Minutes of Exercise per Session: 30 min  Stress: No Stress Concern Present (12/05/2021)   [employer] of Occupational Health - Occupational Stress Questionnaire    Feeling of Stress : Not at all  Social Connections: Moderately Integrated (12/05/2021)   Social Connection and Isolation Panel [NHANES]    Frequency of Communication with Friends and Family: More than three times a week    Frequency of Social Gatherings with Friends and Family: More than three times a week    Attends Religious Services: More than 4 times per year    Active Member of [employer] or Organizations: Yes    Attends Banker Meetings: More than 4 times per year    Marital Status: Separated  Intimate Partner Violence: Not At Risk (12/05/2021)   Humiliation, Afraid, Rape, and Kick questionnaire    Fear of Current or Ex-Partner: No    Emotionally Abused: No    Physically Abused: No    Sexually Abused: No    Family History  Problem Relation Age of Onset   Cancer Mother 26       lung ca   Cancer Father 1       glioblastoma    Anti-infectives: Anti-infectives (From admission, onward)    None       Current Outpatient Medications  Medication Sig Dispense Refill   albuterol (PROAIR HFA) 108 (90 Base) MCG/ACT inhaler Inhale 2 puffs into the lungs every 4 (four) hours as needed for wheezing or shortness of breath. 54 g 3  ALPRAZolam (XANAX) 0.5 MG tablet TAKE 1 TABLET BY MOUTH TWICE  DAILY AS NEEDED FOR ANXIETY 180 tablet 1   B Complex-C (B-COMPLEX WITH VITAMIN C) tablet Take 1 tablet by mouth daily. 90 tablet 3   Cholecalciferol (VITAMIN D) 2000 units CAPS 1 po qd 100 capsule 3   cyclobenzaprine (FLEXERIL) 5 MG tablet Take 1 tablet (5 mg total) by mouth 2 (two) times daily as needed for muscle spasms. 180 tablet 0   famotidine (PEPCID) 40 MG tablet Take 1 tablet (40 mg total) by mouth daily. 90 tablet 3   fexofenadine-pseudoephedrine (ALLEGRA-D ALLERGY & CONGESTION) 180-240 MG 24 hr tablet Take 1 tablet by mouth daily as needed. 90 tablet 1   finasteride (PROSCAR) 5 MG tablet Take 1 tablet (5 mg total) by mouth daily. 90 tablet 3   hydrOXYzine (ATARAX/VISTARIL) 25 MG tablet Take 1 tablet (25 mg total) by mouth every 8 (eight) hours as needed for itching. 60 tablet 0   hyoscyamine (LEVSIN) 0.125 MG tablet Take 1-2 tablets (0.125-0.25 mg total) by mouth every 4 (four) hours as needed. 120 tablet 1   INCRUSE ELLIPTA 62.5 MCG/ACT AEPB USE 1 INHALATION BY MOUTH DAILY 90 each 3   magnesium gluconate (MAGONATE) 500 MG tablet Take 250 mg by mouth daily. Uses typically once weekly - helps with his sleep     memantine (NAMENDA) 5 MG tablet Take 1 tablet (5 mg total) by mouth 2 (two) times daily. 180 tablet 3   nortriptyline (PAMELOR) 10 MG capsule TAKE 1 TO 2 CAPSULES BY MOUTH  ONCE DAILY AT BEDTIME 180 capsule 1   pseudoephedrine (SUDAFED) 120 MG 12 hr tablet Take 1 tablet (120 mg total) by mouth every 12 (twelve) hours as needed for congestion. 180 tablet 1   sildenafil (VIAGRA) 100 MG tablet TAKE 1 TABLET BY MOUTH DAILY AS  NEEDED FOR ERECTILE DYSFUNCTION 12 tablet 0   tadalafil (CIALIS) 20 MG tablet Take 1 tablet (20 mg total) by mouth daily as needed for erectile dysfunction. If that isn't effective, you can take 1/2 tab of tadalafil with 1/2 tab of sildenafil 100mg  together. 12 tablet 3   tamsulosin (FLOMAX) 0.4 MG CAPS  capsule Take 1 capsule (0.4 mg total) by mouth daily. 90 capsule 3   Turmeric (QC TUMERIC COMPLEX PO) Take by mouth.     umeclidinium bromide (INCRUSE ELLIPTA) 62.5 MCG/INH AEPB Inhale 1 puff into the lungs daily. 3 each 3   vitamin C (ASCORBIC ACID) 500 MG tablet Take 500 mg by mouth daily.     No current facility-administered medications for this visit.     Objective: Vital signs in last 24 hours: BP 138/88   Pulse 67   Intake/Output from previous day: No intake/output data recorded. Intake/Output this shift: @IOTHISSHIFT @   Physical Exam Vitals reviewed.  Constitutional:      Appearance: Normal appearance.  Neurological:     Mental Status: He is alert.     Lab Results:  Results for orders placed or performed in visit on 01/11/23 (from the past 24 hours)  Urinalysis, Routine w reflex microscopic     Status: Abnormal   Collection Time: 01/11/23  2:48 PM  Result Value Ref Range   Specific Gravity, UA 1.010 1.005 - 1.030   pH, UA 6.0 5.0 - 7.5   Color, UA Yellow Yellow   Appearance Ur Clear Clear   Leukocytes,UA Negative Negative   Protein,UA Negative Negative/Trace   Glucose, UA Negative Negative   Ketones, UA Negative  Negative   RBC, UA 1+ (A) Negative   Bilirubin, UA Negative Negative   Urobilinogen, Ur 0.2 0.2 - 1.0 mg/dL   Nitrite, UA Negative Negative   Microscopic Examination See below:    Narrative   Performed at:  [address] - [facility] [address], [place], [place]  [id number] Lab Director: Chinita Pester MT, Phone:  [phone]  Microscopic Examination     Status: Abnormal   Collection Time: 01/11/23  2:48 PM   Urine  Result Value Ref Range   WBC, UA None seen 0 - 5 /hpf   RBC, Urine 3-10 (A) 0 - 2 /hpf   Epithelial Cells (non renal) 0-10 0 - 10 /hpf   Bacteria, UA None seen None seen/Few   Narrative   Performed at:  [address] - [facility] [address], [place], [place]  [id number] Lab Director: Chinita Pester MT, Phone:  [phone]     BMET No results for input(s): "NA", "K", "CL", "CO2", "GLUCOSE", "BUN", "CREATININE", "CALCIUM" in the last 72 hours. PT/INR No results for input(s): "LABPROT", "INR" in the last 72 hours. ABG No results for input(s): "PHART", "HCO3" in the last 72 hours.  Invalid input(s): "PCO2", "PO2" PSA's reviewed.   Latest Reference Range & Units 05/23/17 15:19 09/25/18 10:01 05/31/20 12:08 02/21/21 15:18 12/06/21 13:55 06/06/22 13:46  PSA 0.10 - 4.00 ng/mL 15.80 (H) 14.05 (H) 13.19 (H) 11.92 (H) 13.19 (H) 16.56 (H)  (H): Data is abnormally high Lab Results  Component Value Date   PSA1 26.9 (H) 01/01/2023    Studies/Results: No results found. No results found. Path report reviewed with the results noted above.   Assessment/Plan: Prostate cancer: He has low risk T1c Nx Mx disease based on the biopsy but intermediate risk with the PSA.  I discussed options including active surveillance, radiation and surgery and will keep him on surveillance for now and will add finateride.   Elevated PSA.  His PSA is up to 27 but that is 2 weeks post biopsy.  BPH with BOO.  He has moderate/severe LUTS on tamsulosin.  I will add finasteride and reviewed this side effects and instructions.  .   ED.  He is on combination therapy with tadalafil 10mg  and sildenafil 50mg .  Meds ordered this encounter  Medications   finasteride (PROSCAR) 5 MG tablet    Sig: Take 1 tablet (5 mg total) by mouth daily.    Dispense:  90 tablet    Refill:  3     Orders Placed This Encounter  Procedures   Microscopic Examination   Urinalysis, Routine w reflex microscopic   PSA    Standing Status:   Future    Expected Date:   04/11/2023    Expiration Date:   07/12/2023   PSA    Standing Status:   Future    Expected Date:   07/12/2023    Expiration Date:   01/11/2024     Return in about 6 months (around 07/12/2023) for PSA in 3 months and prior to f/u in 6 months. .    CC: Dr. Trinna Post Plotnikov.      Bjorn Pippin 01/12/2023

## 2023-02-22 ENCOUNTER — Other Ambulatory Visit: Payer: Self-pay | Admitting: Internal Medicine

## 2023-02-22 NOTE — Telephone Encounter (Signed)
Copied from CRM [id number]. Topic: Clinical - Medication Refill >> Feb 22, 2023 11:44 AM Almira Coaster wrote: Most Recent Primary Care Visit:  Provider: Tresa Garter  Department: [facility]  Visit Type: OFFICE VISIT  Date: 10/09/2022  Medication: cyclobenzaprine (FLEXERIL) 5 MG tablet  Has the patient contacted their pharmacy? Yes, They told patient to request a new prescription from ordering provider.  (Agent: If no, request that the patient contact the pharmacy for the refill. If patient does not wish to contact the pharmacy document the reason why and proceed with request.) (Agent: If yes, when and what did the pharmacy advise?)  Is this the correct pharmacy for this prescription? Yes If no, delete pharmacy and type the correct one.  This is the patient's preferred pharmacy:   [facility] - [place], [place] - [address] [place] [address] Phone: [phone] Fax: [phone]   Has the prescription been filled recently? No  Is the patient out of the medication? No  Has the patient been seen for an appointment in the last year OR does the patient have an upcoming appointment? Yes  Can we respond through MyChart? Yes  Agent: Please be advised that Rx refills may take up to 3 business days. We ask that you follow-up with your pharmacy.

## 2023-02-22 NOTE — Telephone Encounter (Signed)
Last Fill: 07/18/22  Last OV: 10/09/22 Next OV: 04/05/2023  Routing to provider for review/authorization

## 2023-03-08 DIAGNOSIS — H0102A Squamous blepharitis right eye, upper and lower eyelids: Secondary | ICD-10-CM | POA: Diagnosis not present

## 2023-03-08 DIAGNOSIS — H25813 Combined forms of age-related cataract, bilateral: Secondary | ICD-10-CM | POA: Diagnosis not present

## 2023-03-08 DIAGNOSIS — H524 Presbyopia: Secondary | ICD-10-CM | POA: Diagnosis not present

## 2023-03-08 DIAGNOSIS — H04123 Dry eye syndrome of bilateral lacrimal glands: Secondary | ICD-10-CM | POA: Diagnosis not present

## 2023-03-08 DIAGNOSIS — H532 Diplopia: Secondary | ICD-10-CM | POA: Diagnosis not present

## 2023-03-28 ENCOUNTER — Ambulatory Visit: Payer: Self-pay | Admitting: Internal Medicine

## 2023-03-28 NOTE — Telephone Encounter (Signed)
 Copied from CRM [id number]. Topic: Clinical - Red Word Triage >> Mar 28, 2023  2:26 PM Rodman Pickle T wrote: Red Word that prompted transfer to Nurse Triage: PATIENT BP 145/108 HE FEELS UNWELL VERY TIRED THIS IS THE THIRD DAY HIS BP HAS BEEN HIGH   Chief Complaint: High blood pressure Symptoms: light headed, feeling "unwell"; fatigue Frequency: constant Pertinent Negatives: Patient denies chest pain Disposition: [] ED /[] Urgent Care (no appt availability in office) / [] Appointment(In office/virtual)/ []  [facility]/ [] Home Care/ [] Refused Recommended Disposition /[] [facility]/ []  Follow-up with PCP Additional Notes: Pt report elevated BP at home, reports   Reason for Disposition  [1] Systolic BP  >= 130 OR Diastolic >= 80 AND [2] not taking BP medications  Answer Assessment - Initial Assessment Questions 1. BLOOD PRESSURE: "What is the blood pressure?" "Did you take at least two measurements 5 minutes apart?"     1:30pm- 144/107; HR 108; 149/101 HR 102  2. ONSET: "When did you take your blood pressure?"     1 hour ago and while on call  3. HOW: "How did you take your blood pressure?" (e.g., automatic home BP monitor, visiting nurse)     Home BP monitor  4. HISTORY: "Do you have a history of high blood pressure?"     No history of HTN  5. MEDICINES: "Are you taking any medicines for blood pressure?" "Have you missed any doses recently?"     No  6. OTHER SYMPTOMS: "Do you have any symptoms?" (e.g., blurred vision, chest pain, difficulty breathing, headache, weakness)     Felt light-headed, blurred vision but light was hurting eyes  Protocols used: Blood Pressure - High-A-AH

## 2023-03-30 ENCOUNTER — Encounter: Payer: Self-pay | Admitting: Family Medicine

## 2023-03-30 ENCOUNTER — Ambulatory Visit (INDEPENDENT_AMBULATORY_CARE_PROVIDER_SITE_OTHER): Payer: Medicare Other | Admitting: Family Medicine

## 2023-03-30 VITALS — BP 170/108 | HR 73 | Temp 98.8°F | Ht 67.0 in | Wt 153.8 lb

## 2023-03-30 DIAGNOSIS — R42 Dizziness and giddiness: Secondary | ICD-10-CM

## 2023-03-30 DIAGNOSIS — R531 Weakness: Secondary | ICD-10-CM

## 2023-03-30 DIAGNOSIS — R0602 Shortness of breath: Secondary | ICD-10-CM

## 2023-03-30 DIAGNOSIS — R110 Nausea: Secondary | ICD-10-CM

## 2023-03-30 DIAGNOSIS — R030 Elevated blood-pressure reading, without diagnosis of hypertension: Secondary | ICD-10-CM

## 2023-03-30 LAB — POCT INFLUENZA A/B
Influenza A, POC: NEGATIVE
Influenza B, POC: NEGATIVE

## 2023-03-30 LAB — CBC WITH DIFFERENTIAL/PLATELET
Basophils Absolute: 0 10*3/uL (ref 0.0–0.1)
Basophils Relative: 0.7 % (ref 0.0–3.0)
Eosinophils Absolute: 0.2 10*3/uL (ref 0.0–0.7)
Eosinophils Relative: 2.6 % (ref 0.0–5.0)
HCT: 45.8 % (ref 39.0–52.0)
Hemoglobin: 15.1 g/dL (ref 13.0–17.0)
Lymphocytes Relative: 42.8 % (ref 12.0–46.0)
Lymphs Abs: 2.5 10*3/uL (ref 0.7–4.0)
MCHC: 32.9 g/dL (ref 30.0–36.0)
MCV: 91.2 fL (ref 78.0–100.0)
Monocytes Absolute: 0.7 10*3/uL (ref 0.1–1.0)
Monocytes Relative: 11.3 % (ref 3.0–12.0)
Neutro Abs: 2.5 10*3/uL (ref 1.4–7.7)
Neutrophils Relative %: 42.6 % — ABNORMAL LOW (ref 43.0–77.0)
Platelets: 238 10*3/uL (ref 150.0–400.0)
RBC: 5.02 Mil/uL (ref 4.22–5.81)
RDW: 13.3 % (ref 11.5–15.5)
WBC: 6 10*3/uL (ref 4.0–10.5)

## 2023-03-30 LAB — COMPREHENSIVE METABOLIC PANEL
ALT: 23 U/L (ref 0–53)
AST: 27 U/L (ref 0–37)
Albumin: 4.6 g/dL (ref 3.5–5.2)
Alkaline Phosphatase: 58 U/L (ref 39–117)
BUN: 16 mg/dL (ref 6–23)
CO2: 30 meq/L (ref 19–32)
Calcium: 10.4 mg/dL (ref 8.4–10.5)
Chloride: 104 meq/L (ref 96–112)
Creatinine, Ser: 1.09 mg/dL (ref 0.40–1.50)
GFR: 68.08 mL/min (ref >60.00)
Glucose, Bld: 85 mg/dL (ref 70–99)
Potassium: 5 meq/L (ref 3.5–5.1)
Sodium: 140 meq/L (ref 135–145)
Total Bilirubin: 1.4 mg/dL — ABNORMAL HIGH (ref 0.2–1.2)
Total Protein: 7.3 g/dL (ref 6.0–8.3)

## 2023-03-30 LAB — TROPONIN I (HIGH SENSITIVITY): High Sens Troponin I: 6 ng/L (ref 2–17)

## 2023-03-30 LAB — POC COVID19 BINAXNOW: SARS Coronavirus 2 Ag: NEGATIVE

## 2023-03-30 MED ORDER — LOSARTAN POTASSIUM 25 MG PO TABS: 25.0000 mg | ORAL_TABLET | Freq: Every day | ORAL | 1 refills | Status: DC

## 2023-03-30 NOTE — Progress Notes (Addendum)
 Acute Office Visit  Subjective:     Patient ID: Joseph Preston, male    DOB: 1950-04-17, 73 y.o.   MRN: [id number]  Chief Complaint  Patient presents with   Acute Visit    Has been having elevated blood pressure readings, feels very tired, leg weakness, feels "out of it" per patient, dizzy. Started on Sunday    HPI Patient is in today for evaluation of elevated blood pressures and feeling unwell, fatigued. Reports that this has been going on for the last week. States that normally he drinks a beer every day, states that he has been feeling so poorly that he has not had alcohol in the last 4 days. Reports shortness of breath on exertion, having to stop and rest often. Reports headache, dizziness as well. Denies chest pain, palpitations, numbness, tingling, loss of strength in extremities, vision changes, tinnitus, other concerning symptoms.  ROS Per HPI      Objective:    BP (!) 170/108 (BP Location: Left Arm, Patient Position: Sitting)   Pulse 73   Temp 98.8 F (37.1 C) (Temporal)   Ht 5\' 7"  (1.702 m)   Wt 153 lb 12.8 oz (69.8 kg)   SpO2 96%   BMI 24.09 kg/m    Physical Exam Vitals and nursing note reviewed.  Constitutional:      General: He is not in acute distress.    Appearance: Normal appearance.     Comments: Face is flushed, appears fatigued   HENT:     Head: Normocephalic and atraumatic.  Eyes:     Extraocular Movements: Extraocular movements intact.  Cardiovascular:     Rate and Rhythm: Normal rate and regular rhythm.     Pulses: Normal pulses.     Heart sounds: Normal heart sounds.  Pulmonary:     Effort: Pulmonary effort is normal. No respiratory distress.     Breath sounds: Normal breath sounds. No wheezing, rhonchi or rales.  Musculoskeletal:        General: Normal range of motion.     Cervical back: Normal range of motion.     Right lower leg: No edema.     Left lower leg: No edema.  Lymphadenopathy:     Cervical: No cervical  adenopathy.  Skin:    General: Skin is warm and dry.  Neurological:     General: No focal deficit present.     Mental Status: He is alert and oriented to person, place, and time.  Psychiatric:        Mood and Affect: Mood normal.        Behavior: Behavior normal.    Results for orders placed or performed in visit on 03/30/23  CBC with Differential/Platelet  Result Value Ref Range   WBC 6.0 4.0 - 10.5 K/uL   RBC 5.02 4.22 - 5.81 Mil/uL   Hemoglobin 15.1 13.0 - 17.0 g/dL   HCT 78.2 95.6 - 21.3 %   MCV 91.2 78.0 - 100.0 fl   MCHC 32.9 30.0 - 36.0 g/dL   RDW 08.6 57.8 - 46.9 %   Platelets 238.0 150.0 - 400.0 K/uL   Neutrophils Relative % 42.6 (L) 43.0 - 77.0 %   Lymphocytes Relative 42.8 12.0 - 46.0 %   Monocytes Relative 11.3 3.0 - 12.0 %   Eosinophils Relative 2.6 0.0 - 5.0 %   Basophils Relative 0.7 0.0 - 3.0 %   Neutro Abs 2.5 1.4 - 7.7 K/uL   Lymphs Abs 2.5 0.7 - 4.0 K/uL  Monocytes Absolute 0.7 0.1 - 1.0 K/uL   Eosinophils Absolute 0.2 0.0 - 0.7 K/uL   Basophils Absolute 0.0 0.0 - 0.1 K/uL  Comprehensive metabolic panel  Result Value Ref Range   Sodium 140 135 - 145 mEq/L   Potassium 5.0 3.5 - 5.1 mEq/L   Chloride 104 96 - 112 mEq/L   CO2 30 19 - 32 mEq/L   Glucose, Bld 85 70 - 99 mg/dL   BUN 16 6 - 23 mg/dL   Creatinine, Ser 1.61 0.40 - 1.50 mg/dL   Total Bilirubin 1.4 (H) 0.2 - 1.2 mg/dL   Alkaline Phosphatase 58 39 - 117 U/L   AST 27 0 - 37 U/L   ALT 23 0 - 53 U/L   Total Protein 7.3 6.0 - 8.3 g/dL   Albumin 4.6 3.5 - 5.2 g/dL   GFR 09.60 >45.40 mL/min   Calcium 10.4 8.4 - 10.5 mg/dL  D-dimer, quantitative  Result Value Ref Range   D-Dimer, Quant 0.56 (H) <0.50 mcg/mL FEU  POCT Influenza A/B  Result Value Ref Range   Influenza A, POC Negative Negative   Influenza B, POC Negative Negative  POC COVID-19 BinaxNow  Result Value Ref Range   SARS Coronavirus 2 Ag Negative Negative  Troponin I (High Sensitivity)  Result Value Ref Range   High Sens Troponin  I 6 2 - 17 ng/L   EKG: Indication: SOB, fatigue, HTN, dizziness Rate: 61 Interpretation: NSR Changes from previous:none I have personally reviewed and interpreted this EKG today      Assessment & Plan:  1. Weakness (Primary)  - POCT Influenza A/B - POC COVID-19 BinaxNow - EKG 12-Lead - CBC with Differential/Platelet - Comprehensive metabolic panel - Troponin I (High Sensitivity); Future - D-dimer, quantitative - Troponin I (High Sensitivity)  2. Nausea  - POCT Influenza A/B - POC COVID-19 BinaxNow - EKG 12-Lead - CBC with Differential/Platelet - Comprehensive metabolic panel  3. Dizziness  - EKG 12-Lead - CBC with Differential/Platelet - Comprehensive metabolic panel - Troponin I (High Sensitivity); Future - D-dimer, quantitative - Troponin I (High Sensitivity)  4. Elevated blood pressure reading in office without diagnosis of hypertension  - EKG 12-Lead - CBC with Differential/Platelet - Comprehensive metabolic panel - losartan (COZAAR) 25 MG tablet; Take 1 tablet (25 mg total) by mouth daily.  Dispense: 30 tablet; Refill: 1 - Check BP at home once daily in the mornings for the next month. Follow up with me in a month to recheck blood pressures.  5. SOBOE (shortness of breath on exertion)  - EKG 12-Lead - CBC with Differential/Platelet - Comprehensive metabolic panel - Troponin I (High Sensitivity); Future - D-dimer, quantitative - Troponin I (High Sensitivity)   Meds ordered this encounter  Medications   losartan (COZAAR) 25 MG tablet    Sig: Take 1 tablet (25 mg total) by mouth daily.    Dispense:  30 tablet    Refill:  1    Return in about 4 weeks (around 04/27/2023) for med check.  Moshe Cipro, FNP

## 2023-03-30 NOTE — Patient Instructions (Addendum)
 Check BP at home once daily in the mornings for the next month. Follow up with me in a month to recheck blood pressures.  Start losartan 25mg  once daily.  EKG looks normal today  We are checking labs today, will be in contact with any results that require further attention  Viral testing was negative.

## 2023-03-31 LAB — D-DIMER, QUANTITATIVE: D-Dimer, Quant: 0.56 ug{FEU}/mL — ABNORMAL HIGH (ref <0.50)

## 2023-04-02 ENCOUNTER — Encounter: Payer: Self-pay | Admitting: Family Medicine

## 2023-04-05 ENCOUNTER — Telehealth: Payer: Self-pay | Admitting: Internal Medicine

## 2023-04-05 ENCOUNTER — Ambulatory Visit: Payer: Medicare Other | Admitting: Internal Medicine

## 2023-04-05 ENCOUNTER — Other Ambulatory Visit: Payer: Self-pay

## 2023-04-05 ENCOUNTER — Encounter: Payer: Self-pay | Admitting: Internal Medicine

## 2023-04-05 VITALS — BP 110/72 | HR 82 | Temp 98.6°F | Ht 67.0 in | Wt 157.0 lb

## 2023-04-05 DIAGNOSIS — N320 Bladder-neck obstruction: Secondary | ICD-10-CM

## 2023-04-05 DIAGNOSIS — R42 Dizziness and giddiness: Secondary | ICD-10-CM | POA: Insufficient documentation

## 2023-04-05 DIAGNOSIS — J3089 Other allergic rhinitis: Secondary | ICD-10-CM

## 2023-04-05 DIAGNOSIS — R413 Other amnesia: Secondary | ICD-10-CM | POA: Diagnosis not present

## 2023-04-05 DIAGNOSIS — F411 Generalized anxiety disorder: Secondary | ICD-10-CM

## 2023-04-05 DIAGNOSIS — I10 Essential (primary) hypertension: Secondary | ICD-10-CM | POA: Diagnosis not present

## 2023-04-05 MED ORDER — FAMOTIDINE 40 MG PO TABS: 40.0000 mg | ORAL_TABLET | Freq: Every day | ORAL | 3 refills | Status: AC

## 2023-04-05 MED ORDER — METHYLPREDNISOLONE 4 MG PO TBPK: ORAL_TABLET | ORAL | 0 refills | Status: DC

## 2023-04-05 MED ORDER — TADALAFIL 5 MG PO TABS: 5.0000 mg | ORAL_TABLET | Freq: Every day | ORAL | 11 refills | Status: DC

## 2023-04-05 MED ORDER — TADALAFIL 5 MG PO TABS
5.0000 mg | ORAL_TABLET | Freq: Every day | ORAL | 11 refills | Status: AC
Start: 2023-04-05 — End: ?

## 2023-04-05 NOTE — Assessment & Plan Note (Signed)
 On Losartan 25 mg/d

## 2023-04-05 NOTE — Progress Notes (Signed)
 Subjective:  Patient ID: Joseph Preston, male    DOB: January 15, 1951  Age: 73 y.o. MRN: [id number]  CC: Medical Management of Chronic Issues (6 mnth f/u, Numbness in both hands... Rt hand is worse, dizziness and off balance increasing. Spot on rt arm pt has questions about.)   HPI Joseph Preston presents for HTN - better C/o allergies C/o lightheadedness - better  Outpatient Medications Prior to Visit  Medication Sig Dispense Refill   albuterol (PROAIR HFA) 108 (90 Base) MCG/ACT inhaler Inhale 2 puffs into the lungs every 4 (four) hours as needed for wheezing or shortness of breath. 54 g 3   ALPRAZolam (XANAX) 0.5 MG tablet TAKE 1 TABLET BY MOUTH TWICE  DAILY AS NEEDED FOR ANXIETY 180 tablet 1   B Complex-C (B-COMPLEX WITH VITAMIN C) tablet Take 1 tablet by mouth daily. 90 tablet 3   Cholecalciferol (VITAMIN D) 2000 units CAPS 1 po qd 100 capsule 3   cyclobenzaprine (FLEXERIL) 5 MG tablet TAKE 1 TABLET BY MOUTH TWICE  DAILY AS NEEDED FOR MUSCLE  SPASM(S) 180 tablet 0   famotidine (PEPCID) 40 MG tablet Take 1 tablet (40 mg total) by mouth daily. 90 tablet 3   fexofenadine-pseudoephedrine (ALLEGRA-D ALLERGY & CONGESTION) 180-240 MG 24 hr tablet Take 1 tablet by mouth daily as needed. 90 tablet 1   finasteride (PROSCAR) 5 MG tablet Take 1 tablet (5 mg total) by mouth daily. 90 tablet 3   hydrOXYzine (ATARAX/VISTARIL) 25 MG tablet Take 1 tablet (25 mg total) by mouth every 8 (eight) hours as needed for itching. 60 tablet 0   hyoscyamine (LEVSIN) 0.125 MG tablet Take 1-2 tablets (0.125-0.25 mg total) by mouth every 4 (four) hours as needed. 120 tablet 1   INCRUSE ELLIPTA 62.5 MCG/ACT AEPB USE 1 INHALATION BY MOUTH DAILY 90 each 3   losartan (COZAAR) 25 MG tablet Take 1 tablet (25 mg total) by mouth daily. 30 tablet 1   magnesium gluconate (MAGONATE) 500 MG tablet Take 250 mg by mouth daily. Uses typically once weekly - helps with his sleep     nortriptyline (PAMELOR) 10 MG capsule TAKE 1 TO  2 CAPSULES BY MOUTH  ONCE DAILY AT BEDTIME 180 capsule 1   pseudoephedrine (SUDAFED) 120 MG 12 hr tablet Take 1 tablet (120 mg total) by mouth every 12 (twelve) hours as needed for congestion. 180 tablet 1   Turmeric (QC TUMERIC COMPLEX PO) Take by mouth.     umeclidinium bromide (INCRUSE ELLIPTA) 62.5 MCG/INH AEPB Inhale 1 puff into the lungs daily. 3 each 3   vitamin C (ASCORBIC ACID) 500 MG tablet Take 500 mg by mouth daily.     memantine (NAMENDA) 5 MG tablet Take 1 tablet (5 mg total) by mouth 2 (two) times daily. 180 tablet 3   sildenafil (VIAGRA) 100 MG tablet TAKE 1 TABLET BY MOUTH DAILY AS  NEEDED FOR ERECTILE DYSFUNCTION 12 tablet 0   tadalafil (CIALIS) 20 MG tablet Take 1 tablet (20 mg total) by mouth daily as needed for erectile dysfunction. If that isn't effective, you can take 1/2 tab of tadalafil with 1/2 tab of sildenafil 100mg  together. 12 tablet 3   tamsulosin (FLOMAX) 0.4 MG CAPS capsule Take 1 capsule (0.4 mg total) by mouth daily. 90 capsule 3   No facility-administered medications prior to visit.    ROS: Review of Systems  Constitutional:  Negative for appetite change, fatigue and unexpected weight change.  HENT:  Negative for congestion, nosebleeds, sneezing, sore  throat and trouble swallowing.   Eyes:  Negative for itching and visual disturbance.  Respiratory:  Negative for cough.   Cardiovascular:  Negative for chest pain, palpitations and leg swelling.  Gastrointestinal:  Negative for abdominal distention, blood in stool, diarrhea and nausea.  Genitourinary:  Negative for frequency and hematuria.  Musculoskeletal:  Negative for arthralgias, back pain, gait problem, joint swelling and neck pain.  Skin:  Negative for rash.  Neurological:  Positive for weakness and light-headedness. Negative for dizziness, tremors and speech difficulty.  Psychiatric/Behavioral:  Negative for agitation, dysphoric mood, self-injury and sleep disturbance. The patient is not  nervous/anxious.     Objective:  BP 110/72   Pulse 82   Temp 98.6 F (37 C) (Oral)   Ht 5\' 7"  (1.702 m)   Wt 157 lb (71.2 kg)   SpO2 97%   BMI 24.59 kg/m   BP Readings from Last 3 Encounters:  04/05/23 110/72  03/30/23 (!) 170/108  01/11/23 138/88    Wt Readings from Last 3 Encounters:  04/05/23 157 lb (71.2 kg)  03/30/23 153 lb 12.8 oz (69.8 kg)  10/09/22 149 lb (67.6 kg)    Physical Exam Constitutional:      General: He is not in acute distress.    Appearance: Normal appearance. He is well-developed.     Comments: NAD  Eyes:     Conjunctiva/sclera: Conjunctivae normal.     Pupils: Pupils are equal, round, and reactive to light.  Neck:     Thyroid: No thyromegaly.     Vascular: No JVD.  Cardiovascular:     Rate and Rhythm: Normal rate and regular rhythm.     Heart sounds: Normal heart sounds. No murmur heard.    No friction rub. No gallop.  Pulmonary:     Effort: Pulmonary effort is normal. No respiratory distress.     Breath sounds: Normal breath sounds. No wheezing or rales.  Chest:     Chest wall: No tenderness.  Abdominal:     General: Bowel sounds are normal. There is no distension.     Palpations: Abdomen is soft. There is no mass.     Tenderness: There is no abdominal tenderness. There is no guarding or rebound.  Musculoskeletal:        General: No tenderness. Normal range of motion.     Cervical back: Normal range of motion.     Right lower leg: No edema.     Left lower leg: No edema.  Lymphadenopathy:     Cervical: No cervical adenopathy.  Skin:    General: Skin is warm and dry.     Findings: No rash.  Neurological:     Mental Status: He is alert and oriented to person, place, and time.     Cranial Nerves: No cranial nerve deficit.     Motor: No abnormal muscle tone.     Coordination: Coordination normal.     Gait: Gait normal.     Deep Tendon Reflexes: Reflexes are normal and symmetric.  Psychiatric:        Behavior: Behavior normal.         Thought Content: Thought content normal.        Judgment: Judgment normal.     Lab Results  Component Value Date   WBC 6.0 03/30/2023   HGB 15.1 03/30/2023   HCT 45.8 03/30/2023   PLT 238.0 03/30/2023   GLUCOSE 85 03/30/2023   CHOL 233 (H) 12/06/2021   TRIG 69.0 12/06/2021  HDL 89.70 12/06/2021   LDLCALC 129 (H) 12/06/2021   ALT 23 03/30/2023   AST 27 03/30/2023   NA 140 03/30/2023   K 5.0 03/30/2023   CL 104 03/30/2023   CREATININE 1.09 03/30/2023   BUN 16 03/30/2023   CO2 30 03/30/2023   TSH 0.84 06/28/2022   PSA 16.56 (H) 06/06/2022   HGBA1C 5.7 03/10/2016    MR PROSTATE W WO CONTRAST Result Date: 09/21/2022 CLINICAL DATA:  Elevated PSA level of 16.56. EXAM: MR PROSTATE WITHOUT AND WITH CONTRAST TECHNIQUE: Multiplanar multisequence MRI images were obtained of the pelvis centered about the prostate. Pre and post contrast images were obtained. CONTRAST:  7 cc Vueway COMPARISON:  None Available. FINDINGS: The patient appears to have moved substantially between initial and scout images and the T2 space UroNAV sequence, resulting in exclusion of through tip of the apex on the UroNAV sequences. The small excluded region does not contain a significant lesion based on other imaging sequences including coronal T2 weighted images. Prostate: Hazing nonfocal low T2 signal in the right peripheral zone is likely postinflammatory considered PI-RADS category 2. Region of interest # 1: PI-RADS category 3 lesion of the left posteromedial peripheral zone in the mid gland and apex with mostly bandlike low T2 signal (image 20, series 8) corresponding to reduced ADC map activity (image 20, series 6) this measures 0.48 cc (1.3 by 0.6 by 1.0 cm). Encapsulated nodularity in the transition zone compatible with benign prostatic hypertrophy. Volume: 3D volumetric analysis: Prostate volume 64.01 cc (5.6 by 4.7 by 5.0 cm). Transcapsular spread:  Absent Seminal vesicle involvement: Absent Neurovascular  bundle involvement: Absent Pelvic adenopathy: Absent Bone metastasis: Absent Other findings: Sigmoid colon diverticulosis. IMPRESSION: 1. PI-RADS category 3 lesion of the left posteromedial peripheral zone in the mid gland and apex. Targeting data sent to UroNAV. 2. Benign prostatic hypertrophy and prostatomegaly. 3. Sigmoid colon diverticulosis. Electronically Signed   By: Gaylyn Rong M.D.   On: 09/21/2022 08:52    Assessment & Plan:   Problem List Items Addressed This Visit     Generalized anxiety disorder   Chronic  Prn Xanax  Potential benefits of a long term benzodiazepines  use as well as potential risks  and complications were explained to the patient and were aknowledged.       Allergic rhinitis   Worse  Medrol pack      Bladder neck obstruction   Flomax  - d/c due to lghtheadedness On Finasteride Start Cialis 5 mg/d      Memory problem   Stable       Lightheadedness - Primary   Flomax  - d/c due to lghtheadedness      HTN (hypertension)   On Losartan 25 mg/d      Relevant Medications   tadalafil (CIALIS) 5 MG tablet      Meds ordered this encounter  Medications   DISCONTD: tadalafil (CIALIS) 5 MG tablet    Sig: Take 1 tablet (5 mg total) by mouth daily.    Dispense:  30 tablet    Refill:  11   methylPREDNISolone (MEDROL DOSEPAK) 4 MG TBPK tablet    Sig: As directed    Dispense:  21 tablet    Refill:  0   tadalafil (CIALIS) 5 MG tablet    Sig: Take 1 tablet (5 mg total) by mouth daily.    Dispense:  30 tablet    Refill:  11      Follow-up: Return in about 2 months (  around 06/05/2023) for a follow-up visit.  Sonda Primes, MD

## 2023-04-05 NOTE — Assessment & Plan Note (Signed)
Chronic  Prn Xanax  Potential benefits of a long term benzodiazepines  use as well as potential risks  and complications were explained to the patient and were aknowledged.

## 2023-04-05 NOTE — Telephone Encounter (Signed)
 Prescription Request  04/05/2023  LOV: 04/05/2023  What is the name of the medication or equipment? famotidine (PEPCID) 40 MG tablet   Have you contacted your pharmacy to request a refill? Yes   Which pharmacy would you like this sent to?  [facility] [address] [place] - [address], [place] [address] Phone: [phone]  Fax: [phone]   Patient notified that their request is being sent to the clinical staff for review and that they should receive a response within 2 business days.   Please advise at [facility] [phone]

## 2023-04-05 NOTE — Assessment & Plan Note (Signed)
 Stable

## 2023-04-05 NOTE — Assessment & Plan Note (Signed)
 Flomax  - d/c due to lghtheadedness On Finasteride Start Cialis 5 mg/d

## 2023-04-05 NOTE — Assessment & Plan Note (Addendum)
Worse Medrol pack

## 2023-04-05 NOTE — Assessment & Plan Note (Signed)
 Flomax  - d/c due to lghtheadedness

## 2023-04-10 ENCOUNTER — Other Ambulatory Visit: Payer: Self-pay

## 2023-04-10 ENCOUNTER — Other Ambulatory Visit

## 2023-04-10 DIAGNOSIS — R972 Elevated prostate specific antigen [PSA]: Secondary | ICD-10-CM

## 2023-04-11 ENCOUNTER — Other Ambulatory Visit: Payer: Medicare Other

## 2023-04-11 ENCOUNTER — Other Ambulatory Visit

## 2023-04-11 LAB — PSA: Prostate Specific Ag, Serum: 13.8 ng/mL — ABNORMAL HIGH (ref 0.0–4.0)

## 2023-04-25 ENCOUNTER — Other Ambulatory Visit: Payer: Self-pay | Admitting: Internal Medicine

## 2023-05-26 ENCOUNTER — Other Ambulatory Visit: Payer: Self-pay | Admitting: Family Medicine

## 2023-05-26 DIAGNOSIS — R030 Elevated blood-pressure reading, without diagnosis of hypertension: Secondary | ICD-10-CM

## 2023-07-05 ENCOUNTER — Encounter: Payer: Self-pay | Admitting: Family Medicine

## 2023-07-05 ENCOUNTER — Telehealth (INDEPENDENT_AMBULATORY_CARE_PROVIDER_SITE_OTHER): Admitting: Family Medicine

## 2023-07-05 VITALS — BP 133/82 | HR 68

## 2023-07-05 DIAGNOSIS — Z79899 Other long term (current) drug therapy: Secondary | ICD-10-CM

## 2023-07-05 DIAGNOSIS — I10 Essential (primary) hypertension: Secondary | ICD-10-CM | POA: Diagnosis not present

## 2023-07-05 MED ORDER — LOSARTAN POTASSIUM 25 MG PO TABS: 25.0000 mg | ORAL_TABLET | Freq: Every day | ORAL | 1 refills | Status: AC

## 2023-07-05 NOTE — Patient Instructions (Signed)
 I have sent refills of losartan  to [facility] in [place].  Follow-up with Dr. Georgia Kipper as scheduled, sooner if needed.

## 2023-07-05 NOTE — Progress Notes (Signed)
 Virtual Visit via Video Note  I connected with Joseph Preston on 07/05/23 at 10:20 AM EDT by a video enabled telemedicine application and verified that I am speaking with the correct person using two identifiers.  Patient Location: Home Provider Location: Office/Clinic  I discussed the limitations, risks, security, and privacy concerns of performing an evaluation and management service by video and the availability of in person appointments. I also discussed with the patient that there may be a patient responsible charge related to this service. The patient expressed understanding and agreed to proceed.  Subjective: PCP: Plotnikov, Oakley Bellman, MD  Chief Complaint  Patient presents with   Med check    losartan    HPI 73 year old male presents for A/V visit for medication management. History of hypertension, requesting refill of losartan . States that blood pressures have been stable since last visit. In the higher 120s to 130 over 80s.,  Heart rate in the 60s to 70s. Denies any dizziness, headaches, increased fatigue, palpitations, shortness of breath, other concerns today. Medical history as outlined below  ROS: Per HPI  Current Outpatient Medications:    albuterol  (PROAIR  HFA) 108 (90 Base) MCG/ACT inhaler, Inhale 2 puffs into the lungs every 4 (four) hours as needed for wheezing or shortness of breath., Disp: 54 g, Rfl: 3   ALPRAZolam  (XANAX ) 0.5 MG tablet, TAKE 1 TABLET BY MOUTH TWICE  DAILY AS NEEDED FOR ANXIETY, Disp: 180 tablet, Rfl: 1   B Complex-C (B-COMPLEX WITH VITAMIN C) tablet, Take 1 tablet by mouth daily., Disp: 90 tablet, Rfl: 3   Cholecalciferol (VITAMIN D ) 2000 units CAPS, 1 po qd, Disp: 100 capsule, Rfl: 3   cyclobenzaprine  (FLEXERIL ) 5 MG tablet, TAKE 1 TABLET BY MOUTH TWICE  DAILY AS NEEDED FOR MUSCLE  SPASM(S), Disp: 180 tablet, Rfl: 0   famotidine  (PEPCID ) 40 MG tablet, Take 1 tablet (40 mg total) by mouth daily., Disp: 90 tablet, Rfl: 3    fexofenadine -pseudoephedrine  (ALLEGRA-D ALLERGY & CONGESTION) 180-240 MG 24 hr tablet, Take 1 tablet by mouth daily as needed., Disp: 90 tablet, Rfl: 1   finasteride  (PROSCAR ) 5 MG tablet, Take 1 tablet (5 mg total) by mouth daily., Disp: 90 tablet, Rfl: 3   hydrOXYzine  (ATARAX /VISTARIL ) 25 MG tablet, Take 1 tablet (25 mg total) by mouth every 8 (eight) hours as needed for itching., Disp: 60 tablet, Rfl: 0   hyoscyamine  (LEVSIN) 0.125 MG tablet, Take 1-2 tablets (0.125-0.25 mg total) by mouth every 4 (four) hours as needed., Disp: 120 tablet, Rfl: 1   INCRUSE ELLIPTA  62.5 MCG/ACT AEPB, USE 1 INHALATION BY MOUTH DAILY, Disp: 90 each, Rfl: 3   magnesium gluconate (MAGONATE) 500 MG tablet, Take 250 mg by mouth daily. Uses typically once weekly - helps with his sleep, Disp: , Rfl:    methylPREDNISolone  (MEDROL  DOSEPAK) 4 MG TBPK tablet, As directed, Disp: 21 tablet, Rfl: 0   nortriptyline  (PAMELOR ) 10 MG capsule, TAKE 1 TO 2 CAPSULES BY MOUTH  ONCE DAILY AT BEDTIME, Disp: 180 capsule, Rfl: 3   tadalafil  (CIALIS ) 5 MG tablet, Take 1 tablet (5 mg total) by mouth daily., Disp: 30 tablet, Rfl: 11   Turmeric (QC TUMERIC COMPLEX PO), Take by mouth., Disp: , Rfl:    umeclidinium bromide  (INCRUSE ELLIPTA ) 62.5 MCG/INH AEPB, Inhale 1 puff into the lungs daily., Disp: 3 each, Rfl: 3   vitamin C (ASCORBIC ACID) 500 MG tablet, Take 500 mg by mouth daily., Disp: , Rfl:    losartan  (COZAAR ) 25 MG tablet, Take  1 tablet (25 mg total) by mouth daily., Disp: 90 tablet, Rfl: 1  Observations/Objective: Today's Vitals   07/05/23 1002  BP: 133/82  Pulse: 68   Physical Exam Vitals and nursing note reviewed.  Constitutional:      General: He is not in acute distress. HENT:     Head: Normocephalic and atraumatic.  Eyes:     Extraocular Movements: Extraocular movements intact.  Pulmonary:     Effort: Pulmonary effort is normal.  Musculoskeletal:     Cervical back: Normal range of motion.  Neurological:      General: No focal deficit present.     Mental Status: He is alert and oriented to person, place, and time.  Psychiatric:        Mood and Affect: Mood normal.        Behavior: Behavior normal.     Assessment and Plan: Primary hypertension -     Losartan  Potassium; Take 1 tablet (25 mg total) by mouth daily.  Dispense: 90 tablet; Refill: 1  Medication management -     Losartan  Potassium; Take 1 tablet (25 mg total) by mouth daily.  Dispense: 90 tablet; Refill: 1    Follow Up Instructions: No follow-ups on file.   I discussed the assessment and treatment plan with the patient. The patient was provided an opportunity to ask questions, and all were answered. The patient agreed with the plan and demonstrated an understanding of the instructions.   The patient was advised to call back or seek an in-person evaluation if the symptoms worsen or if the condition fails to improve as anticipated.  I spent 10 minutes on A/V visit  The above assessment and management plan was discussed with the patient. The patient verbalized understanding of and has agreed to the management plan.   Wellington Half, FNP

## 2023-07-12 ENCOUNTER — Other Ambulatory Visit: Payer: Medicare Other

## 2023-07-12 DIAGNOSIS — R972 Elevated prostate specific antigen [PSA]: Secondary | ICD-10-CM

## 2023-07-13 ENCOUNTER — Ambulatory Visit: Payer: Self-pay

## 2023-07-13 ENCOUNTER — Ambulatory Visit (INDEPENDENT_AMBULATORY_CARE_PROVIDER_SITE_OTHER)

## 2023-07-13 VITALS — Ht 67.0 in | Wt 152.0 lb

## 2023-07-13 DIAGNOSIS — H9193 Unspecified hearing loss, bilateral: Secondary | ICD-10-CM | POA: Diagnosis not present

## 2023-07-13 DIAGNOSIS — Z1211 Encounter for screening for malignant neoplasm of colon: Secondary | ICD-10-CM

## 2023-07-13 DIAGNOSIS — Z0000 Encounter for general adult medical examination without abnormal findings: Secondary | ICD-10-CM

## 2023-07-13 LAB — PSA: Prostate Specific Ag, Serum: 12.6 ng/mL — ABNORMAL HIGH (ref 0.0–4.0)

## 2023-07-13 NOTE — Progress Notes (Addendum)
 Subjective:   Joseph Preston is a 73 y.o. who presents for a Medicare Wellness preventive visit.  As a reminder, Annual Wellness Visits don't include a physical exam, and some assessments may be limited, especially if this visit is performed virtually. We may recommend an in-person follow-up visit with your provider if needed.  Visit Complete: Virtual I connected with  Joseph Preston on 07/13/23 by a audio enabled telemedicine application and verified that I am speaking with the correct person using two identifiers.  Patient Location: Home  Provider Location: Office/Clinic  I discussed the limitations of evaluation and management by telemedicine. The patient expressed understanding and agreed to proceed.  Vital Signs: Because this visit was a virtual/telehealth visit, some criteria may be missing or patient reported. Any vitals not documented were not able to be obtained and vitals that have been documented are patient reported.  VideoDeclined- This patient declined Librarian, academic. Therefore the visit was completed with audio only.  Persons Participating in Visit: Patient.  AWV Questionnaire: Yes: Patient Medicare AWV questionnaire was completed by the patient on 07/13/2023; I have confirmed that all information answered by patient is correct and no changes since this date.  Cardiac Risk Factors include: advanced age (>24men, >4 women);hypertension;male gender     Objective:    Today's Vitals   07/13/23 0903  Weight: 152 lb (68.9 kg)  Height: 5' 7 (1.702 m)   Body mass index is 23.81 kg/m.     07/13/2023    9:02 AM 12/05/2021   11:29 AM 12/05/2021   10:50 AM 12/02/2020   11:05 AM 01/31/2020    9:10 PM  Advanced Directives  Does Patient Have a Medical Advance Directive? No No No No No  Would patient like information on creating a medical advance directive? Yes (MAU/Ambulatory/Procedural Areas - Information given) Yes  (MAU/Ambulatory/Procedural Areas - Information given) No - Patient declined No - Patient declined No - Patient declined    Current Medications (verified) Outpatient Encounter Medications as of 07/13/2023  Medication Sig   albuterol  (PROAIR  HFA) 108 (90 Base) MCG/ACT inhaler Inhale 2 puffs into the lungs every 4 (four) hours as needed for wheezing or shortness of breath.   ALPRAZolam  (XANAX ) 0.5 MG tablet TAKE 1 TABLET BY MOUTH TWICE  DAILY AS NEEDED FOR ANXIETY   B Complex-C (B-COMPLEX WITH VITAMIN C) tablet Take 1 tablet by mouth daily.   Cholecalciferol (VITAMIN D ) 2000 units CAPS 1 po qd   cyclobenzaprine  (FLEXERIL ) 5 MG tablet TAKE 1 TABLET BY MOUTH TWICE  DAILY AS NEEDED FOR MUSCLE  SPASM(S)   famotidine  (PEPCID ) 40 MG tablet Take 1 tablet (40 mg total) by mouth daily.   fexofenadine -pseudoephedrine  (ALLEGRA-D ALLERGY & CONGESTION) 180-240 MG 24 hr tablet Take 1 tablet by mouth daily as needed.   finasteride  (PROSCAR ) 5 MG tablet Take 1 tablet (5 mg total) by mouth daily.   hydrOXYzine  (ATARAX /VISTARIL ) 25 MG tablet Take 1 tablet (25 mg total) by mouth every 8 (eight) hours as needed for itching.   hyoscyamine  (LEVSIN ) 0.125 MG tablet Take 1-2 tablets (0.125-0.25 mg total) by mouth every 4 (four) hours as needed.   INCRUSE ELLIPTA  62.5 MCG/ACT AEPB USE 1 INHALATION BY MOUTH DAILY   losartan  (COZAAR ) 25 MG tablet Take 1 tablet (25 mg total) by mouth daily.   magnesium gluconate (MAGONATE) 500 MG tablet Take 250 mg by mouth daily. Uses typically once weekly - helps with his sleep   methylPREDNISolone  (MEDROL  DOSEPAK) 4 MG  TBPK tablet As directed   nortriptyline  (PAMELOR ) 10 MG capsule TAKE 1 TO 2 CAPSULES BY MOUTH  ONCE DAILY AT BEDTIME   tadalafil  (CIALIS ) 5 MG tablet Take 1 tablet (5 mg total) by mouth daily.   Turmeric (QC TUMERIC COMPLEX PO) Take by mouth.   umeclidinium bromide  (INCRUSE ELLIPTA ) 62.5 MCG/INH AEPB Inhale 1 puff into the lungs daily.   vitamin C (ASCORBIC ACID) 500 MG  tablet Take 500 mg by mouth daily.   No facility-administered encounter medications on file as of 07/13/2023.    Allergies (verified) Penicillins, Sulfonamide derivatives, Varenicline tartrate, Aricept  [donepezil ], Namenda  [memantine ], and Tramadol    History: Past Medical History:  Diagnosis Date   Anxiety    Arthritis    Back pain, chronic    Past Surgical History:  Procedure Laterality Date   COLONOSCOPY W/ POLYPECTOMY     LEG SURGERY     Family History  Problem Relation Age of Onset   Cancer Mother 24       lung ca   Cancer Father 24       glioblastoma   Social History   Socioeconomic History   Marital status: Legally Separated    Spouse name: Not on file   Number of children: Not on file   Years of education: Not on file   Highest education level: Some college, no degree  Occupational History   Not on file  Tobacco Use   Smoking status: Former    Current packs/day: 0.00    Average packs/day: 1 pack/day for 19.0 years (19.0 ttl pk-yrs)    Types: Cigarettes    Start date: 12/24/2001    Quit date: 12/24/2020    Years since quitting: 2.5   Smokeless tobacco: Former  Substance and Sexual Activity   Alcohol use: Yes    Comment: 5-6 beers a day   Drug use: No   Sexual activity: Yes  Other Topics Concern   Not on file  Social History Narrative   Not on file   Social Drivers of Health   Financial Resource Strain: Low Risk  (07/13/2023)   Overall Financial Resource Strain (CARDIA)    Difficulty of Paying Living Expenses: Not hard at all  Food Insecurity: Food Insecurity Present (07/13/2023)   Hunger Vital Sign    Worried About Running Out of Food in the Last Year: Never true    Ran Out of Food in the Last Year: Sometimes true  Transportation Needs: No Transportation Needs (07/13/2023)   PRAPARE - Administrator, Civil Service (Medical): No    Lack of Transportation (Non-Medical): No  Physical Activity: Sufficiently Active (07/13/2023)   Exercise  Vital Sign    Days of Exercise per Week: 3 days    Minutes of Exercise per Session: 60 min  Stress: No Stress Concern Present (07/13/2023)   [employer] of Occupational Health - Occupational Stress Questionnaire    Feeling of Stress: Only a little  Social Connections: Moderately Integrated (07/13/2023)   Social Connection and Isolation Panel    Frequency of Communication with Friends and Family: More than three times a week    Frequency of Social Gatherings with Friends and Family: Twice a week    Attends Religious Services: 1 to 4 times per year    Active Member of [employer] or Organizations: Yes    Attends Engineer, structural: More than 4 times per year    Marital Status: Separated    Tobacco Counseling Counseling given: No  Clinical Intake:  Pre-visit preparation completed: Yes  Pain : No/denies pain     BMI - recorded: 23.81 Nutritional Risks: None Diabetes: No  Lab Results  Component Value Date   HGBA1C 5.7 03/10/2016     How often do you need to have someone help you when you read instructions, pamphlets, or other written materials from your doctor or pharmacy?: 1 - Never  Interpreter Needed?: No  Information entered by :: Kandy Orris, CMA   Activities of Daily Living     07/13/2023    9:06 AM 07/13/2023    7:40 AM  In your present state of health, do you have any difficulty performing the following activities:  Hearing? 0 0  Vision? 1 1  Difficulty concentrating or making decisions? 1 1  Walking or climbing stairs? 0   Dressing or bathing? 0 0  Doing errands, shopping? 0 0  Preparing Food and eating ? N N  Using the Toilet? N N  In the past six months, have you accidently leaked urine? Colie Dawes  Comment wears depends   Do you have problems with loss of bowel control? N N  Managing your Medications? N N  Managing your Finances? N N  Housekeeping or managing your Housekeeping? N N    Patient Care Team: Plotnikov, Oakley Bellman, MD as PCP -  General (Internal Medicine) Szabat, Tino Foreman, [facility] (Inactive) (Pharmacist) Szabat, Tino Foreman, [facility] (Inactive) as Pharmacist (Pharmacist) Amedeo Jupiter, MD as Consulting Physician (Ophthalmology) Margette Sheldon, LPN as Licensed Practical Nurse (Internal Medicine)  I have updated your Care Teams any recent Medical Services you may have received from other providers in the past year.     Assessment:   This is a routine wellness examination for [employer].  Hearing/Vision screen Hearing Screening - Comments:: C/o difficulty hearing - referral to an Audiologist Vision Screening - Comments:: Wears rx glasses - up to date with routine eye exams with Dr Lasandra Points   Goals Addressed               This Visit's Progress     Patient Stated (pt-stated)        Patient stated he wants to stay busy - doing yardwork       Depression Screen     07/13/2023    9:13 AM 04/05/2023   11:10 AM 10/09/2022    1:19 PM 04/27/2022    5:04 PM 12/05/2021   10:52 AM 12/02/2020   11:07 AM 06/18/2020    2:35 PM  PHQ 2/9 Scores  PHQ - 2 Score 0 0 0 0 0 0 1  PHQ- 9 Score 0      5    Fall Risk     07/13/2023    9:10 AM 07/13/2023    7:40 AM 04/05/2023   11:10 AM 10/09/2022    1:19 PM 04/27/2022    5:04 PM  Fall Risk   Falls in the past year? 0 0 0 0 0  Number falls in past yr: 0 0 0 0 0  Injury with Fall? 0 0 0 0 0  Risk for fall due to : No Fall Risks  No Fall Risks No Fall Risks No Fall Risks  Follow up Falls evaluation completed;Falls prevention discussed  Falls evaluation completed Falls evaluation completed Falls evaluation completed    MEDICARE RISK AT HOME:  Medicare Risk at Home Any stairs in or around the home?: Yes (outside) If so, are there any without handrails?: No Home free  of loose throw rugs in walkways, pet beds, electrical cords, etc?: Yes Adequate lighting in your home to reduce risk of falls?: Yes Life alert?: No Use of a cane, walker or w/c?: No Grab bars in the bathroom?:  No Shower chair or bench in shower?: No Elevated toilet seat or a handicapped toilet?: No  TIMED UP AND GO:  Was the test performed?  No  Cognitive Function: 6CIT completed        07/13/2023    9:15 AM 12/05/2021   10:58 AM  6CIT Screen  What Year? 0 points 0 points  What month? 0 points 0 points  What time? 0 points 0 points  Count back from 20 0 points 0 points  Months in reverse 0 points 0 points  Repeat phrase 0 points 0 points  Total Score 0 points 0 points    Immunizations Immunization History  Administered Date(s) Administered   Hepatitis B, ADULT 12/18/2013, 01/28/2014, 06/10/2014   Influenza Whole 02/27/2005   Tdap 01/05/2015    Screening Tests Health Maintenance  Topic Date Due   COVID-19 Vaccine (1) Never done   Pneumococcal Vaccine: 50+ Years (1 of 2 - PCV) Never done   Colonoscopy  12/27/2015   Medicare Annual Wellness (AWV)  07/12/2024   DTaP/Tdap/Td (2 - Td or Tdap) 01/04/2025   Hepatitis C Screening  Completed   HPV VACCINES  Aged Out   Meningococcal B Vaccine  Aged Out   INFLUENZA VACCINE  Discontinued   Zoster Vaccines- Shingrix  Discontinued    Health Maintenance  Health Maintenance Due  Topic Date Due   COVID-19 Vaccine (1) Never done   Pneumococcal Vaccine: 50+ Years (1 of 2 - PCV) Never done   Colonoscopy  12/27/2015   Health Maintenance Items Addressed:  I have recommended that this patient have a immunization for Pneumonia but he declines at this time. I have discussed the risks and benefits of this procedure with him. The patient verbalizes understanding.   Referral sent to GI for colonoscopy, Referral to an Audiologist  Additional Screening:  Vision Screening: Recommended annual ophthalmology exams for early detection of glaucoma and other disorders of the eye. Would you like a referral to an eye doctor? No  Pt stated is up-to-date on eye exam w/Dr Lasandra Points of [facility].  Dental Screening: Recommended annual dental exams  for proper oral hygiene  Community Resource Referral / Chronic Care Management: CRR required this visit?  No   CCM required this visit?  No   Plan:    I have personally reviewed and noted the following in the patient's chart:   Medical and social history Use of alcohol, tobacco or illicit drugs  Current medications and supplements including opioid prescriptions. Patient is not currently taking opioid prescriptions. Functional ability and status Nutritional status Physical activity Advanced directives List of other physicians Hospitalizations, surgeries, and ER visits in previous 12 months Vitals Screenings to include cognitive, depression, and falls Referrals and appointments  In addition, I have reviewed and discussed with patient certain preventive protocols, quality metrics, and best practice recommendations. A written personalized care plan for preventive services as well as general preventive health recommendations were provided to patient.   Patria Bookbinder, CMA   07/13/2023   After Visit Summary: (MyChart) Due to this being a telephonic visit, the after visit summary with patients personalized plan was offered to patient via MyChart   Notes: Nothing significant to report at this time.  Medical screening examination/treatment/procedure(s) were performed  by non-physician practitioner and as supervising physician I was immediately available for consultation/collaboration.  I agree with above. Adelaide Holy, MD

## 2023-07-13 NOTE — Patient Instructions (Signed)
 Joseph Preston , Thank you for taking time out of your busy schedule to complete your Annual Wellness Visit with me. I enjoyed our conversation and look forward to speaking with you again next year. I, as well as your care team,  appreciate your ongoing commitment to your health goals. Please review the following plan we discussed and let me know if I can assist you in the future. Your Game plan/ To Do List    Referrals: If you haven't heard from the office you've been referred to, please reach out to them at the phone provided.  Referral to an Audiologist and to  GI (Dr Willy Harvest) for a Screening Colonoscopy Follow up Visits: Next Medicare AWV with our clinical staff: 07/17/2024   Have you seen your provider in the last 6 months (3 months if uncontrolled diabetes)? Yes Next Office Visit with your provider: no pending appt  Clinician Recommendations:  Aim for 30 minutes of exercise or brisk walking, 6-8 glasses of water, and 5 servings of fruits and vegetables each day. Educated and advised on getting the Pneumonia vaccine in 2025.      This is a list of the screening recommended for you and due dates:  Health Maintenance  Topic Date Due   COVID-19 Vaccine (1) Never done   Pneumococcal Vaccine for age over 33 (1 of 2 - PCV) Never done   Colon Cancer Screening  12/27/2015   Medicare Annual Wellness Visit  07/12/2024   DTaP/Tdap/Td vaccine (2 - Td or Tdap) 01/04/2025   Hepatitis C Screening  Completed   HPV Vaccine  Aged Out   Meningitis B Vaccine  Aged Out   Flu Shot  Discontinued   Zoster (Shingles) Vaccine  Discontinued    Advanced directives: (Provided) Advance directive discussed with you today. I have provided a copy for you to complete at home and have notarized. Once this is complete, please bring a copy in to our office so we can scan it into your chart.  Advance Care Planning is important because it:  [x]  Makes sure you receive the medical care that is consistent with your  values, goals, and preferences  [x]  It provides guidance to your family and loved ones and reduces their decisional burden about whether or not they are making the right decisions based on your wishes.  Follow the link provided in your after visit summary or read over the paperwork we have mailed to you to help you started getting your Advance Directives in place. If you need assistance in completing these, please reach out to us  so that we can help you!

## 2023-07-19 ENCOUNTER — Ambulatory Visit: Payer: Medicare Other | Admitting: Urology

## 2023-07-19 ENCOUNTER — Encounter: Payer: Self-pay | Admitting: Urology

## 2023-07-19 VITALS — BP 135/71 | HR 60

## 2023-07-19 DIAGNOSIS — N138 Other obstructive and reflux uropathy: Secondary | ICD-10-CM | POA: Diagnosis not present

## 2023-07-19 DIAGNOSIS — R972 Elevated prostate specific antigen [PSA]: Secondary | ICD-10-CM

## 2023-07-19 DIAGNOSIS — N401 Enlarged prostate with lower urinary tract symptoms: Secondary | ICD-10-CM | POA: Diagnosis not present

## 2023-07-19 DIAGNOSIS — R3912 Poor urinary stream: Secondary | ICD-10-CM | POA: Diagnosis not present

## 2023-07-19 DIAGNOSIS — N5201 Erectile dysfunction due to arterial insufficiency: Secondary | ICD-10-CM

## 2023-07-19 DIAGNOSIS — C61 Malignant neoplasm of prostate: Secondary | ICD-10-CM | POA: Diagnosis not present

## 2023-07-19 DIAGNOSIS — R350 Frequency of micturition: Secondary | ICD-10-CM

## 2023-07-19 NOTE — Progress Notes (Signed)
 Subjective: 1. Elevated PSA   2. Prostate cancer (HCC)   3. BPH with urinary obstruction   4. Urinary frequency   5. Weak urinary stream   6. Erectile dysfunction due to arterial insufficiency       Consult requested by Dr. Anitra Barn.  07/19/23: Joseph Preston returns today in f/u. For his history of  T1c GG1 prostate cancer with a 77ml prostate and a PSA of 12.6 on 07/12/23 which is back to baseline following a just after the biopsy.  His PSA has been fairly stable for 10 years.  He is voiding ok with an IPSS of 11 with nocturia x 2.  He was unable to tolerate finasteride  and stopped that about a month ago.   He remains on daily tadalafil  for the LUTS and ED.  He has used sildenafil  in combination and thinks that helps.   01/11/23: Joseph Preston returns today in f/u from an MR fusion biopsy.  He had a 77ml prostate with GG1 in 10% of the RBL core and the 2 ROI's are negative. His last PSA was up to 27 on 01/01/20 but he had just had the biopsy 2 weeks prior.  His IPSS is 24.  He is currently on tamsulosin .  He is using sildenafil  with some benefit.  His libido is down some.   10/05/22: Joseph Preston returns today in f/u from his recent MRIP that showed a PIRADS 3 lesion on the left posterior medial PZ in the mid and apical prostate.  The prostate was 64ml.  His IPSS is 24 on tamsulosin .   07/13/22: Joseph Preston is a former patient of Dr. Joie Narrow who is sent back for a PSA of 16.56.  He had a negative biopsy in 2015 when his PSA was up to 10.12.  His prostate was 60ml at that time.  He has BPH with BOO and has an IPSS of 19 on tamuslosin. He has nocturia x 3.  He is on sildenafil  for ED with some response.  He has had no hematuria or dysuria.  He has had no UTI's.     IPSS     Row Name 07/19/23 1400         International Prostate Symptom Score   How often have you had the sensation of not emptying your bladder? About half the time     How often have you had to urinate less than every two hours? Less  than 1 in 5 times     How often have you found you stopped and started again several times when you urinated? Less than 1 in 5 times     How often have you found it difficult to postpone urination? Less than half the time     How often have you had a weak urinary stream? Less than half the time     How often have you had to strain to start urination? Not at All     How many times did you typically get up at night to urinate? 2 Times     Total IPSS Score 11       Quality of Life due to urinary symptoms   If you were to spend the rest of your life with your urinary condition just the way it is now how would you feel about that? Mostly Satisfied           ROS:  Review of Systems  Constitutional:  Positive for malaise/fatigue.  HENT:  Positive for congestion.  Eyes:  Positive for blurred vision.  Respiratory:  Positive for shortness of breath.   Gastrointestinal:  Positive for constipation and diarrhea.  Musculoskeletal:  Positive for back pain and joint pain.  Neurological:  Positive for dizziness and weakness.  Psychiatric/Behavioral:  Positive for memory loss.   All other systems reviewed and are negative.   Allergies  Allergen Reactions   Penicillins Itching and Swelling   Sulfonamide Derivatives Itching and Swelling   Varenicline Tartrate Other (See Comments)    states that he had nightmares and felt crazy    Aricept  [Donepezil ]     Upset stomach   Namenda  [Memantine ]     lightheaded   Tramadol  Other (See Comments)    dizzy    Past Medical History:  Diagnosis Date   Anxiety    Arthritis    Back pain, chronic     Past Surgical History:  Procedure Laterality Date   COLONOSCOPY W/ POLYPECTOMY     LEG SURGERY      Social History   Socioeconomic History   Marital status: Legally Separated    Spouse name: Not on file   Number of children: Not on file   Years of education: Not on file   Highest education level: Some college, no degree  Occupational History    Not on file  Tobacco Use   Smoking status: Former    Current packs/day: 0.00    Average packs/day: 1 pack/day for 19.0 years (19.0 ttl pk-yrs)    Types: Cigarettes    Start date: 12/24/2001    Quit date: 12/24/2020    Years since quitting: 2.5   Smokeless tobacco: Former  Substance and Sexual Activity   Alcohol use: Yes    Comment: 5-6 beers a day   Drug use: No   Sexual activity: Yes  Other Topics Concern   Not on file  Social History Narrative   Not on file   Social Drivers of Health   Financial Resource Strain: Low Risk  (07/13/2023)   Overall Financial Resource Strain (CARDIA)    Difficulty of Paying Living Expenses: Not hard at all  Food Insecurity: Food Insecurity Present (07/13/2023)   Hunger Vital Sign    Worried About Running Out of Food in the Last Year: Never true    Ran Out of Food in the Last Year: Sometimes true  Transportation Needs: No Transportation Needs (07/13/2023)   PRAPARE - Administrator, Civil Service (Medical): No    Lack of Transportation (Non-Medical): No  Physical Activity: Sufficiently Active (07/13/2023)   Exercise Vital Sign    Days of Exercise per Week: 3 days    Minutes of Exercise per Session: 60 min  Stress: No Stress Concern Present (07/13/2023)   [employer] of Occupational Health - Occupational Stress Questionnaire    Feeling of Stress: Only a little  Social Connections: Moderately Integrated (07/13/2023)   Social Connection and Isolation Panel    Frequency of Communication with Friends and Family: More than three times a week    Frequency of Social Gatherings with Friends and Family: Twice a week    Attends Religious Services: 1 to 4 times per year    Active Member of [employer] or Organizations: Yes    Attends Banker Meetings: More than 4 times per year    Marital Status: Separated  Intimate Partner Violence: Not At Risk (07/13/2023)   Humiliation, Afraid, Rape, and Kick questionnaire    Fear of Current  or Ex-Partner: No  Emotionally Abused: No    Physically Abused: No    Sexually Abused: No    Family History  Problem Relation Age of Onset   Cancer Mother 29       lung ca   Cancer Father 79       glioblastoma    Anti-infectives: Anti-infectives (From admission, onward)    None       Current Outpatient Medications  Medication Sig Dispense Refill   albuterol  (PROAIR  HFA) 108 (90 Base) MCG/ACT inhaler Inhale 2 puffs into the lungs every 4 (four) hours as needed for wheezing or shortness of breath. 54 g 3   ALPRAZolam  (XANAX ) 0.5 MG tablet TAKE 1 TABLET BY MOUTH TWICE  DAILY AS NEEDED FOR ANXIETY 180 tablet 1   B Complex-C (B-COMPLEX WITH VITAMIN C) tablet Take 1 tablet by mouth daily. 90 tablet 3   Cholecalciferol (VITAMIN D ) 2000 units CAPS 1 po qd 100 capsule 3   cyclobenzaprine  (FLEXERIL ) 5 MG tablet TAKE 1 TABLET BY MOUTH TWICE  DAILY AS NEEDED FOR MUSCLE  SPASM(S) 180 tablet 0   famotidine  (PEPCID ) 40 MG tablet Take 1 tablet (40 mg total) by mouth daily. 90 tablet 3   fexofenadine -pseudoephedrine  (ALLEGRA-D ALLERGY & CONGESTION) 180-240 MG 24 hr tablet Take 1 tablet by mouth daily as needed. 90 tablet 1   finasteride  (PROSCAR ) 5 MG tablet Take 1 tablet (5 mg total) by mouth daily. 90 tablet 3   hydrOXYzine  (ATARAX /VISTARIL ) 25 MG tablet Take 1 tablet (25 mg total) by mouth every 8 (eight) hours as needed for itching. 60 tablet 0   hyoscyamine  (LEVSIN ) 0.125 MG tablet Take 1-2 tablets (0.125-0.25 mg total) by mouth every 4 (four) hours as needed. 120 tablet 1   INCRUSE ELLIPTA  62.5 MCG/ACT AEPB USE 1 INHALATION BY MOUTH DAILY 90 each 3   losartan  (COZAAR ) 25 MG tablet Take 1 tablet (25 mg total) by mouth daily. 90 tablet 1   magnesium gluconate (MAGONATE) 500 MG tablet Take 250 mg by mouth daily. Uses typically once weekly - helps with his sleep     methylPREDNISolone  (MEDROL  DOSEPAK) 4 MG TBPK tablet As directed 21 tablet 0   nortriptyline  (PAMELOR ) 10 MG capsule TAKE 1 TO  2 CAPSULES BY MOUTH  ONCE DAILY AT BEDTIME 180 capsule 3   tadalafil  (CIALIS ) 5 MG tablet Take 1 tablet (5 mg total) by mouth daily. 30 tablet 11   Turmeric (QC TUMERIC COMPLEX PO) Take by mouth.     umeclidinium bromide  (INCRUSE ELLIPTA ) 62.5 MCG/INH AEPB Inhale 1 puff into the lungs daily. 3 each 3   vitamin C (ASCORBIC ACID) 500 MG tablet Take 500 mg by mouth daily.     No current facility-administered medications for this visit.     Objective: Vital signs in last 24 hours: BP 135/71   Pulse 60   Intake/Output from previous day: No intake/output data recorded. Intake/Output this shift: @IOTHISSHIFT @   Physical Exam Vitals reviewed.  Constitutional:      Appearance: Normal appearance.   Neurological:     Mental Status: He is alert.     Lab Results:  No results found for this or any previous visit (from the past 24 hours).   BMET No results for input(s): NA, K, CL, CO2, GLUCOSE, BUN, CREATININE, CALCIUM in the last 72 hours. PT/INR No results for input(s): LABPROT, INR in the last 72 hours. ABG No results for input(s): PHART, HCO3 in the last 72 hours.  Invalid input(s): PCO2, PO2 PSA's  reviewed.   Latest Reference Range & Units 05/23/17 15:19 09/25/18 10:01 05/31/20 12:08 02/21/21 15:18 12/06/21 13:55 06/06/22 13:46  PSA 0.10 - 4.00 ng/mL 15.80 (H) 14.05 (H) 13.19 (H) 11.92 (H) 13.19 (H) 16.56 (H)  (H): Data is abnormally high Lab Results  Component Value Date   PSA1 12.6 (H) 07/12/2023   PSA1 13.8 (H) 04/10/2023   PSA1 26.9 (H) 01/01/2023    Studies/Results: No results found. No results found. Path report reviewed with the results noted above.   Assessment/Plan: Prostate cancer: He has low risk T1c Nx Mx disease based on the biopsy but intermediate risk with the PSA.  I discussed options including active surveillance, radiation and surgery and will keep him on surveillance for now.  He was unable to tolerate the finasteride  and  is off.   I will recheck the PSA in 3 months and then at f/u in 6 months.   BPH with BOO.  He has moderate LUTS on tadalafil  5mg  daily.  He also think he gets some benefit from the Tumeric.  He had side effects with the finasteride .   ED.  He is on combination therapy with tadalafil  and sildenafil .  No orders of the defined types were placed in this encounter.    Orders Placed This Encounter  Procedures   Urinalysis, Routine w reflex microscopic   PSA    Standing Status:   Future    Expected Date:   01/18/2024    Expiration Date:   07/18/2024   PSA    Standing Status:   Future    Expected Date:   10/19/2023    Expiration Date:   01/18/2024     Return in about 6 months (around 01/18/2024) for eskridge with a PSA. Joseph Preston    CC: Dr. Fabio Holts Plotnikov.      Zarinah Oviatt 07/19/2023

## 2023-08-11 ENCOUNTER — Other Ambulatory Visit: Payer: Self-pay | Admitting: Internal Medicine

## 2023-08-14 NOTE — Telephone Encounter (Unsigned)
 Copied from CRM [id number]. Topic: Clinical - Medication Refill >> Aug 14, 2023  3:08 PM Paige D wrote: Medication: Alprazolam  0.5 mg   Has the patient contacted their pharmacy? Yes (Agent: If no, request that the patient contact the pharmacy for the refill. If patient does not wish to contact the pharmacy document the reason why and proceed with request.) (Agent: If yes, when and what did the pharmacy advise?)  This is the patient's preferred pharmacy:   [facility] [place] [place] - [address] [place] [address] Phone: [phone] Fax: [phone]  Is this the correct pharmacy for this prescription? Yes If no, delete pharmacy and type the correct one.   Has the prescription been filled recently? No  Is the patient out of the medication? Yes  Has the patient been seen for an appointment in the last year OR does the patient have an upcoming appointment? Yes  Can we respond through MyChart? Yes  Agent: Please be advised that Rx refills may take up to 3 business days. We ask that you follow-up with your pharmacy.

## 2023-09-13 ENCOUNTER — Encounter: Payer: Self-pay | Admitting: Internal Medicine

## 2023-10-15 ENCOUNTER — Ambulatory Visit (AMBULATORY_SURGERY_CENTER)

## 2023-10-15 VITALS — Ht 67.0 in | Wt 152.0 lb

## 2023-10-15 DIAGNOSIS — Z1211 Encounter for screening for malignant neoplasm of colon: Secondary | ICD-10-CM

## 2023-10-15 MED ORDER — NA SULFATE-K SULFATE-MG SULF 17.5-3.13-1.6 GM/177ML PO SOLN: 1.0000 | Freq: Once | ORAL | 0 refills | Status: AC

## 2023-10-15 NOTE — Progress Notes (Signed)
 No egg or soy allergy known to patient  No issues known to pt with past sedation with any surgeries or procedures Patient denies ever being told they had issues or difficulty with intubation  No FH of Malignant Hyperthermia Pt is not on diet pills Pt is not on  home 02  Pt is not on blood thinners  Pt has intermittent issues with constipation caralee, will take Miralax  No A fib or A flutter Have any cardiac testing pending--No Pt can ambulate  Pt denies use of chewing tobacco Discussed diabetic I weight loss medication holds Discussed NSAID holds Checked BMI Pt instructed to use Singlecare.com or GoodRx for a price reduction on prep  Patient's chart reviewed by Norleen Schillings CNRA prior to previsit and patient appropriate for the [facility].  Pre visit completed and red dot placed by patient's name on their procedure day (on provider's schedule).

## 2023-10-29 ENCOUNTER — Other Ambulatory Visit: Payer: Self-pay | Admitting: Internal Medicine

## 2023-10-29 NOTE — Telephone Encounter (Unsigned)
 Copied from CRM [id number]. Topic: Clinical - Medication Refill >> Oct 29, 2023  2:04 PM Turkey A wrote: Medication: cyclobenzaprine  (FLEXERIL ) 5 MG tablet  Has the patient contacted their pharmacy? No (Agent: If no, request that the patient contact the pharmacy for the refill. If patient does not wish to contact the pharmacy document the reason why and proceed with request.) (Agent: If yes, when and what did the pharmacy advise?)  This is the patient's preferred pharmacy:  [facility] [address], [place] - [address] [place] [address] Phone: [phone] Fax: [phone]  Is this the correct pharmacy for this prescription? Yes If no, delete pharmacy and type the correct one.   Has the prescription been filled recently? No  Is the patient out of the medication? No 3 days  Has the patient been seen for an appointment in the last year OR does the patient have an upcoming appointment? No  Can we respond through MyChart? Yes  Agent: Please be advised that Rx refills may take up to 3 business days. We ask that you follow-up with your pharmacy.

## 2023-11-01 MED ORDER — CYCLOBENZAPRINE HCL 5 MG PO TABS: 5.0000 mg | ORAL_TABLET | Freq: Two times a day (BID) | ORAL | 0 refills | Status: DC | PRN

## 2023-11-06 ENCOUNTER — Encounter: Payer: Self-pay | Admitting: Internal Medicine

## 2023-11-11 NOTE — Progress Notes (Unsigned)
 [facility] History and Physical   Primary Care Physician:  Garald Karlynn GAILS, MD   Reason for Procedure:    Encounter Diagnosis  Name Primary?   History of colonic polyps Yes     Plan:    Colonoscopy     HPI: Joseph Preston is a 73 y.o. male with a history of adenomatous colon polyps that were less than 1 cm, last colonoscopy 2007 did not have any polyps, but did show left-sided diverticulosis and internal hemorrhoids.   Past Medical History:  Diagnosis Date   Anxiety    Arthritis    Back pain, chronic    Cancer (HCC)    Prostate   COPD (chronic obstructive pulmonary disease) (HCC)    Diabetes mellitus without complication (HCC)    Hypertension     Past Surgical History:  Procedure Laterality Date   COLONOSCOPY W/ POLYPECTOMY     LEG SURGERY     NASAL SEPTUM SURGERY       Current Outpatient Medications  Medication Sig Dispense Refill   albuterol  (PROAIR  HFA) 108 (90 Base) MCG/ACT inhaler Inhale 2 puffs into the lungs every 4 (four) hours as needed for wheezing or shortness of breath. 54 g 3   ALPRAZolam  (XANAX ) 0.5 MG tablet TAKE 1 TABLET BY MOUTH TWICE  DAILY AS NEEDED FOR ANXIETY 180 tablet 1   B Complex-C (B-COMPLEX WITH VITAMIN C) tablet Take 1 tablet by mouth daily. 90 tablet 3   Cholecalciferol (VITAMIN D ) 2000 units CAPS 1 po qd 100 capsule 3   cyclobenzaprine  (FLEXERIL ) 5 MG tablet Take 1 tablet (5 mg total) by mouth 2 (two) times daily as needed for muscle spasms. TAKE 1 TABLET BY MOUTH TWICE  DAILY AS NEEDED FOR MUSCLE  SPASM(S) 180 tablet 0   famotidine  (PEPCID ) 40 MG tablet Take 1 tablet (40 mg total) by mouth daily. 90 tablet 3   fexofenadine -pseudoephedrine  (ALLEGRA-D ALLERGY & CONGESTION) 180-240 MG 24 hr tablet Take 1 tablet by mouth daily as needed. 90 tablet 1   finasteride  (PROSCAR ) 5 MG tablet Take 1 tablet (5 mg total) by mouth daily. 90 tablet 3   hydrOXYzine  (ATARAX /VISTARIL ) 25 MG tablet Take 1 tablet (25 mg total) by  mouth every 8 (eight) hours as needed for itching. 60 tablet 0   hyoscyamine  (LEVSIN ) 0.125 MG tablet Take 1-2 tablets (0.125-0.25 mg total) by mouth every 4 (four) hours as needed. 120 tablet 1   INCRUSE ELLIPTA  62.5 MCG/ACT AEPB USE 1 INHALATION BY MOUTH DAILY 90 each 3   losartan  (COZAAR ) 25 MG tablet Take 1 tablet (25 mg total) by mouth daily. 90 tablet 1   magnesium gluconate (MAGONATE) 500 MG tablet Take 250 mg by mouth daily. Uses typically once weekly - helps with his sleep     methylPREDNISolone  (MEDROL  DOSEPAK) 4 MG TBPK tablet As directed 21 tablet 0   nortriptyline  (PAMELOR ) 10 MG capsule TAKE 1 TO 2 CAPSULES BY MOUTH  ONCE DAILY AT BEDTIME 180 capsule 3   tadalafil  (CIALIS ) 20 MG tablet Take 20 mg by mouth daily as needed.     tadalafil  (CIALIS ) 5 MG tablet Take 1 tablet (5 mg total) by mouth daily. 30 tablet 11   Turmeric (QC TUMERIC COMPLEX PO) Take by mouth.     umeclidinium bromide  (INCRUSE ELLIPTA ) 62.5 MCG/INH AEPB Inhale 1 puff into the lungs daily. 3 each 3   vitamin C (ASCORBIC ACID) 500 MG tablet Take 500 mg by mouth daily.     No  current facility-administered medications for this visit.    Allergies as of 11/12/2023 - Review Complete 10/15/2023  Allergen Reaction Noted   Penicillins Itching and Swelling    Sulfonamide derivatives Itching and Swelling    Varenicline tartrate Other (See Comments)    Aricept  [donepezil ]  02/21/2021   Namenda  [memantine ]  04/05/2023   Tramadol  Other (See Comments) 06/02/2013    Family History  Problem Relation Age of Onset   Cancer Mother 32       lung ca   Cancer Father 16       glioblastoma   Colon cancer Neg Hx    Rectal cancer Neg Hx    Stomach cancer Neg Hx    Esophageal cancer Neg Hx     Social History   Socioeconomic History   Marital status: Legally Separated    Spouse name: Not on file   Number of children: Not on file   Years of education: Not on file   Highest education level: Some college, no degree   Occupational History   Not on file  Tobacco Use   Smoking status: Former    Current packs/day: 0.00    Average packs/day: 1 pack/day for 19.0 years (19.0 ttl pk-yrs)    Types: Cigarettes    Start date: 12/24/2001    Quit date: 12/24/2020    Years since quitting: 2.8   Smokeless tobacco: Former  Substance and Sexual Activity   Alcohol use: Yes    Comment: 5-6 beers a day   Drug use: Yes    Types: Marijuana   Sexual activity: Yes  Other Topics Concern   Not on file  Social History Narrative   Not on file   Social Drivers of Health   Financial Resource Strain: Low Risk  (07/13/2023)   Overall Financial Resource Strain (CARDIA)    Difficulty of Paying Living Expenses: Not hard at all  Food Insecurity: Food Insecurity Present (07/13/2023)   Hunger Vital Sign    Worried About Running Out of Food in the Last Year: Never true    Ran Out of Food in the Last Year: Sometimes true  Transportation Needs: No Transportation Needs (07/13/2023)   PRAPARE - Administrator, Civil Service (Medical): No    Lack of Transportation (Non-Medical): No  Physical Activity: Sufficiently Active (07/13/2023)   Exercise Vital Sign    Days of Exercise per Week: 3 days    Minutes of Exercise per Session: 60 min  Stress: No Stress Concern Present (07/13/2023)   [employer] of Occupational Health - Occupational Stress Questionnaire    Feeling of Stress: Only a little  Social Connections: Moderately Integrated (07/13/2023)   Social Connection and Isolation Panel    Frequency of Communication with Friends and Family: More than three times a week    Frequency of Social Gatherings with Friends and Family: Twice a week    Attends Religious Services: 1 to 4 times per year    Active Member of [employer] or Organizations: Yes    Attends Banker Meetings: More than 4 times per year    Marital Status: Separated  Intimate Partner Violence: Not At Risk (07/13/2023)   Humiliation, Afraid, Rape,  and Kick questionnaire    Fear of Current or Ex-Partner: No    Emotionally Abused: No    Physically Abused: No    Sexually Abused: No    Review of Systems: Positive for *** All other review of systems negative except as mentioned in the  HPI.  Physical Exam: Vital signs There were no vitals taken for this visit.  General:   Alert,  Well-developed, well-nourished, pleasant and cooperative in NAD Lungs:  Clear throughout to auscultation.   Heart:  Regular rate and rhythm; no murmurs, clicks, rubs,  or gallops. Abdomen:  Soft, nontender and nondistended. Normal bowel sounds.   Neuro/Psych:  Alert and cooperative. Normal mood and affect. A and O x 3   @Juli Odom  CHARLENA Commander, MD, [facility] [phone] (pager) 11/11/2023 6:53 PM@

## 2023-11-12 ENCOUNTER — Encounter: Admitting: Internal Medicine

## 2023-11-12 DIAGNOSIS — Z860100 Personal history of colon polyps, unspecified: Secondary | ICD-10-CM

## 2023-11-16 ENCOUNTER — Other Ambulatory Visit: Payer: Self-pay | Admitting: Internal Medicine

## 2023-11-20 NOTE — Progress Notes (Unsigned)
 [facility] History and Physical   Primary Care Physician:  Garald Karlynn GAILS, MD   Reason for Procedure:    Encounter Diagnosis  Name Primary?   Special screening for malignant neoplasms, colon Yes     Plan:    Colonoscopy     HPI: Joseph Preston is a 73 y.o. male presenting for a screening colonoscopy exam.  Last exam was in 2007 and was free of polyps but did show diverticulosis.  No difficulty completing that exam.   Past Medical History:  Diagnosis Date   Anxiety    Arthritis    Back pain, chronic    Cancer (HCC)    Prostate   COPD (chronic obstructive pulmonary disease) (HCC)    Diabetes mellitus without complication (HCC)    Hypertension     Past Surgical History:  Procedure Laterality Date   COLONOSCOPY W/ POLYPECTOMY     LEG SURGERY     NASAL SEPTUM SURGERY       Current Outpatient Medications  Medication Sig Dispense Refill   albuterol  (PROAIR  HFA) 108 (90 Base) MCG/ACT inhaler Inhale 2 puffs into the lungs every 4 (four) hours as needed for wheezing or shortness of breath. 54 g 3   ALPRAZolam  (XANAX ) 0.5 MG tablet TAKE 1 TABLET BY MOUTH TWICE  DAILY AS NEEDED FOR ANXIETY 180 tablet 1   B Complex-C (B-COMPLEX WITH VITAMIN C) tablet Take 1 tablet by mouth daily. 90 tablet 3   Cholecalciferol (VITAMIN D ) 2000 units CAPS 1 po qd 100 capsule 3   cyclobenzaprine  (FLEXERIL ) 5 MG tablet TAKE 1 TABLET BY MOUTH TWICE  DAILY AS NEEDED FOR MUSCLE  SPASM(S) 180 tablet 0   famotidine  (PEPCID ) 40 MG tablet Take 1 tablet (40 mg total) by mouth daily. 90 tablet 3   fexofenadine -pseudoephedrine  (ALLEGRA-D ALLERGY & CONGESTION) 180-240 MG 24 hr tablet Take 1 tablet by mouth daily as needed. 90 tablet 1   finasteride  (PROSCAR ) 5 MG tablet Take 1 tablet (5 mg total) by mouth daily. 90 tablet 3   hydrOXYzine  (ATARAX /VISTARIL ) 25 MG tablet Take 1 tablet (25 mg total) by mouth every 8 (eight) hours as needed for itching. 60 tablet 0   hyoscyamine  (LEVSIN )  0.125 MG tablet Take 1-2 tablets (0.125-0.25 mg total) by mouth every 4 (four) hours as needed. 120 tablet 1   INCRUSE ELLIPTA  62.5 MCG/ACT AEPB USE 1 INHALATION BY MOUTH DAILY 90 each 3   losartan  (COZAAR ) 25 MG tablet Take 1 tablet (25 mg total) by mouth daily. 90 tablet 1   magnesium gluconate (MAGONATE) 500 MG tablet Take 250 mg by mouth daily. Uses typically once weekly - helps with his sleep     methylPREDNISolone  (MEDROL  DOSEPAK) 4 MG TBPK tablet As directed 21 tablet 0   nortriptyline  (PAMELOR ) 10 MG capsule TAKE 1 TO 2 CAPSULES BY MOUTH  ONCE DAILY AT BEDTIME 180 capsule 3   tadalafil  (CIALIS ) 20 MG tablet Take 20 mg by mouth daily as needed.     tadalafil  (CIALIS ) 5 MG tablet Take 1 tablet (5 mg total) by mouth daily. 30 tablet 11   Turmeric (QC TUMERIC COMPLEX PO) Take by mouth.     umeclidinium bromide  (INCRUSE ELLIPTA ) 62.5 MCG/INH AEPB Inhale 1 puff into the lungs daily. 3 each 3   vitamin C (ASCORBIC ACID) 500 MG tablet Take 500 mg by mouth daily.     No current facility-administered medications for this visit.    Allergies as of 11/21/2023 - Review Complete 10/15/2023  Allergen Reaction Noted   Penicillins Itching and Swelling    Sulfonamide derivatives Itching and Swelling    Varenicline tartrate Other (See Comments)    Aricept  [donepezil ]  02/21/2021   Namenda  [memantine ]  04/05/2023   Tramadol  Other (See Comments) 06/02/2013    Family History  Problem Relation Age of Onset   Cancer Mother 67       lung ca   Cancer Father 60       glioblastoma   Colon cancer Neg Hx    Rectal cancer Neg Hx    Stomach cancer Neg Hx    Esophageal cancer Neg Hx     Social History   Socioeconomic History   Marital status: Legally Separated    Spouse name: Not on file   Number of children: Not on file   Years of education: Not on file   Highest education level: Some college, no degree  Occupational History   Not on file  Tobacco Use   Smoking status: Former    Current  packs/day: 0.00    Average packs/day: 1 pack/day for 19.0 years (19.0 ttl pk-yrs)    Types: Cigarettes    Start date: 12/24/2001    Quit date: 12/24/2020    Years since quitting: 2.9   Smokeless tobacco: Former  Substance and Sexual Activity   Alcohol use: Yes    Comment: 5-6 beers a day   Drug use: Yes    Types: Marijuana   Sexual activity: Yes  Other Topics Concern   Not on file  Social History Narrative   Not on file   Social Drivers of Health   Financial Resource Strain: Low Risk  (07/13/2023)   Overall Financial Resource Strain (CARDIA)    Difficulty of Paying Living Expenses: Not hard at all  Food Insecurity: Food Insecurity Present (07/13/2023)   Hunger Vital Sign    Worried About Running Out of Food in the Last Year: Never true    Ran Out of Food in the Last Year: Sometimes true  Transportation Needs: No Transportation Needs (07/13/2023)   PRAPARE - Administrator, Civil Service (Medical): No    Lack of Transportation (Non-Medical): No  Physical Activity: Sufficiently Active (07/13/2023)   Exercise Vital Sign    Days of Exercise per Week: 3 days    Minutes of Exercise per Session: 60 min  Stress: No Stress Concern Present (07/13/2023)   [employer] of Occupational Health - Occupational Stress Questionnaire    Feeling of Stress: Only a little  Social Connections: Moderately Integrated (07/13/2023)   Social Connection and Isolation Panel    Frequency of Communication with Friends and Family: More than three times a week    Frequency of Social Gatherings with Friends and Family: Twice a week    Attends Religious Services: 1 to 4 times per year    Active Member of [employer] or Organizations: Yes    Attends Banker Meetings: More than 4 times per year    Marital Status: Separated  Intimate Partner Violence: Not At Risk (07/13/2023)   Humiliation, Afraid, Rape, and Kick questionnaire    Fear of Current or Ex-Partner: No    Emotionally Abused: No     Physically Abused: No    Sexually Abused: No    Review of Systems: Positive for *** All other review of systems negative except as mentioned in the HPI.  Physical Exam: Vital signs There were no vitals taken for this visit.  General:  Alert,  Well-developed, well-nourished, pleasant and cooperative in NAD Lungs:  Clear throughout to auscultation.   Heart:  Regular rate and rhythm; no murmurs, clicks, rubs,  or gallops. Abdomen:  Soft, nontender and nondistended. Normal bowel sounds.   Neuro/Psych:  Alert and cooperative. Normal mood and affect. A and O x 3   @Otho Michalik  CHARLENA Commander, MD, [facility] [phone] (pager) 11/20/2023 9:16 PM@

## 2023-11-21 ENCOUNTER — Ambulatory Visit (AMBULATORY_SURGERY_CENTER): Admitting: Internal Medicine

## 2023-11-21 ENCOUNTER — Encounter: Payer: Self-pay | Admitting: Internal Medicine

## 2023-11-21 VITALS — BP 136/77 | HR 58 | Temp 98.1°F | Resp 12 | Ht 67.0 in | Wt 152.0 lb

## 2023-11-21 DIAGNOSIS — D123 Benign neoplasm of transverse colon: Secondary | ICD-10-CM

## 2023-11-21 DIAGNOSIS — D120 Benign neoplasm of cecum: Secondary | ICD-10-CM | POA: Diagnosis not present

## 2023-11-21 DIAGNOSIS — K621 Rectal polyp: Secondary | ICD-10-CM

## 2023-11-21 DIAGNOSIS — D128 Benign neoplasm of rectum: Secondary | ICD-10-CM

## 2023-11-21 DIAGNOSIS — Z1211 Encounter for screening for malignant neoplasm of colon: Secondary | ICD-10-CM

## 2023-11-21 DIAGNOSIS — E119 Type 2 diabetes mellitus without complications: Secondary | ICD-10-CM | POA: Diagnosis not present

## 2023-11-21 DIAGNOSIS — K635 Polyp of colon: Secondary | ICD-10-CM | POA: Diagnosis not present

## 2023-11-21 DIAGNOSIS — J449 Chronic obstructive pulmonary disease, unspecified: Secondary | ICD-10-CM | POA: Diagnosis not present

## 2023-11-21 DIAGNOSIS — I10 Essential (primary) hypertension: Secondary | ICD-10-CM | POA: Diagnosis not present

## 2023-11-21 DIAGNOSIS — K5730 Diverticulosis of large intestine without perforation or abscess without bleeding: Secondary | ICD-10-CM

## 2023-11-21 DIAGNOSIS — D125 Benign neoplasm of sigmoid colon: Secondary | ICD-10-CM

## 2023-11-21 DIAGNOSIS — D127 Benign neoplasm of rectosigmoid junction: Secondary | ICD-10-CM | POA: Diagnosis not present

## 2023-11-21 DIAGNOSIS — K648 Other hemorrhoids: Secondary | ICD-10-CM

## 2023-11-21 MED ORDER — SODIUM CHLORIDE 0.9 % IV SOLN: 500.0000 mL | Freq: Once | INTRAVENOUS | Status: DC

## 2023-11-21 NOTE — Progress Notes (Signed)
 Pt's states no medical or surgical changes since previsit or office visit.

## 2023-11-21 NOTE — Progress Notes (Signed)
 Vss nad trans to pacu

## 2023-11-21 NOTE — Progress Notes (Signed)
 Called to room to assist during endoscopic procedure.  Patient ID and intended procedure confirmed with present staff. Received instructions for my participation in the procedure from the performing physician.

## 2023-11-21 NOTE — Op Note (Signed)
 [facility] Patient Name: Joseph Preston Procedure Date: 11/21/2023 3:57 PM MRN: [id number] Endoscopist: Lupita FORBES Commander , MD, [id number] Age: 73 Referring MD:  Date of Birth: 25-Dec-1950 Gender: Male Account #: [id number] Procedure:                Colonoscopy Indications:              Screening for colorectal malignant neoplasm, Last                            colonoscopy: 2007 Medicines:                Monitored Anesthesia Care Procedure:                Pre-Anesthesia Assessment:                           - Prior to the procedure, a History and Physical                            was performed, and patient medications and                            allergies were reviewed. The patient's tolerance of                            previous anesthesia was also reviewed. The risks                            and benefits of the procedure and the sedation                            options and risks were discussed with the patient.                            All questions were answered, and informed consent                            was obtained. Prior Anticoagulants: The patient has                            taken no anticoagulant or antiplatelet agents. ASA                            Grade Assessment: III - A patient with severe                            systemic disease. After reviewing the risks and                            benefits, the patient was deemed in satisfactory                            condition to undergo the procedure.  After obtaining informed consent, the colonoscope                            was passed under direct vision. Throughout the                            procedure, the patient's blood pressure, pulse, and                            oxygen saturations were monitored continuously. The                            Olympus Scope [id number] was introduced through the                            anus and advanced to the the  cecum, identified by                            appendiceal orifice and ileocecal valve. The                            colonoscopy was performed without difficulty. The                            patient tolerated the procedure well. The quality                            of the bowel preparation was adequate. The                            ileocecal valve, appendiceal orifice, and rectum                            were photographed. The bowel preparation used was                            SUPREP via split dose instruction. Scope In: 4:09:23 PM Scope Out: 4:32:05 PM Scope Withdrawal Time: 0 hours 16 minutes 38 seconds  Total Procedure Duration: 0 hours 22 minutes 42 seconds  Findings:                 The perianal and digital rectal examinations were                            normal. Pertinent negatives include normal prostate                            (size, shape, and consistency).                           A 15 mm polyp was found in the mid transverse                            colon. The polyp was semi-pedunculated. The polyp  was removed with a hot snare. Resection and                            retrieval were complete. Verification of patient                            identification for the specimen was done. Estimated                            blood loss: none.                           Four sessile polyps were found in the rectum,                            sigmoid colon, distal transverse colon and cecum.                            The polyps were diminutive in size. These polyps                            were removed with a cold snare. Resection and                            retrieval were complete. Verification of patient                            identification for the specimen was done. Estimated                            blood loss was minimal.                           Multiple diverticula were found in the sigmoid                             colon, descending colon, transverse colon and                            ascending colon.                           Internal hemorrhoids were found.                           The exam was otherwise without abnormality on                            direct and retroflexion views. Complications:            No immediate complications. Estimated Blood Loss:     Estimated blood loss was minimal. Impression:               - One 15 mm polyp in the mid transverse colon,  removed with a hot snare. Resected and retrieved.                           - Four diminutive polyps in the rectum, in the                            sigmoid colon, in the distal transverse colon and                            in the cecum, removed with a cold snare. Resected                            and retrieved.                           - Diverticulosis in the sigmoid colon, in the                            descending colon, in the transverse colon and in                            the ascending colon.                           - Internal hemorrhoids.                           - The examination was otherwise normal on direct                            and retroflexion views. Recommendation:           - Patient has a contact number available for                            emergencies. The signs and symptoms of potential                            delayed complications were discussed with the                            patient. Return to normal activities tomorrow.                            Written discharge instructions were provided to the                            patient.                           - Resume previous diet.                           - Continue present medications.                           -  Repeat colonoscopy is recommended. The                            colonoscopy date will be determined after pathology                            results from today's exam become available  for                            review.                           - No aspirin, ibuprofen, naproxen, or other                            non-steroidal anti-inflammatory drugs for 2 weeks                            after polyp removal. Lupita FORBES Commander, MD 11/21/2023 4:42:19 PM This report has been signed electronically.

## 2023-11-21 NOTE — Patient Instructions (Addendum)
 Please read handouts provided. Continue present medications. Await pathology results. No aspirin, ibuprofen, naproxen, or other non-steriodal anti-inflammatory drugs for 2 weeks. Resume previous diet.  YOU HAD AN ENDOSCOPIC PROCEDURE TODAY AT THE [facility]:   Refer to the procedure report that was given to you for any specific questions about what was found during the examination.  If the procedure report does not answer your questions, please call your gastroenterologist to clarify.  If you requested that your care partner not be given the details of your procedure findings, then the procedure report has been included in a sealed envelope for you to review at your convenience later.  YOU SHOULD EXPECT: Some feelings of bloating in the abdomen. Passage of more gas than usual.  Walking can help get rid of the air that was put into your GI tract during the procedure and reduce the bloating. If you had a lower endoscopy (such as a colonoscopy or flexible sigmoidoscopy) you may notice spotting of blood in your stool or on the toilet paper. If you underwent a bowel prep for your procedure, you may not have a normal bowel movement for a few days.  Please Note:  You might notice some irritation and congestion in your nose or some drainage.  This is from the oxygen used during your procedure.  There is no need for concern and it should clear up in a day or so.  SYMPTOMS TO REPORT IMMEDIATELY:  Following lower endoscopy (colonoscopy or flexible sigmoidoscopy):  Excessive amounts of blood in the stool  Significant tenderness or worsening of abdominal pains  Swelling of the abdomen that is new, acute  Fever of 100F or higher.  For urgent or emergent issues, a gastroenterologist can be reached at any hour by calling [phone]. Do not use MyChart messaging for urgent concerns.    DIET:  We do recommend a small meal at first, but then you may proceed to your regular diet.  Drink plenty  of fluids but you should avoid alcoholic beverages for 24 hours.  ACTIVITY:  You should plan to take it easy for the rest of today and you should NOT DRIVE or use heavy machinery until tomorrow (because of the sedation medicines used during the test).    FOLLOW UP: Our staff will call the number listed on your records the next business day following your procedure.  We will call around 7:15- 8:00 am to check on you and address any questions or concerns that you may have regarding the information given to you following your procedure. If we do not reach you, we will leave a message.     If any biopsies were taken you will be contacted by phone or by letter within the next 1-3 weeks.  Please call us  at [phone] if you have not heard about the biopsies in 3 weeks.    SIGNATURES/CONFIDENTIALITY: You and/or your care partner have signed paperwork which will be entered into your electronic medical record.  These signatures attest to the fact that that the information above on your After Visit Summary has been reviewed and is understood.  Full responsibility of the confidentiality of this discharge information lies with you and/or your care-partner.I found and removed 5 polyps.  4 were tiny and 1 was medium sized.  I will let you know pathology results and when to have another routine colonoscopy by mail and/or My Chart.  You also have a condition called diverticulosis - common and not usually a problem.  Please read the handout provided.  Internal hemorrhoids were also seen.  I appreciate the opportunity to care for you. Lupita CHARLENA Commander, MD, [facility]

## 2023-11-22 ENCOUNTER — Telehealth: Payer: Self-pay

## 2023-11-22 NOTE — Telephone Encounter (Signed)
 No answer on follow-up call. Voice mail box full. Unable to leave message for pt.

## 2023-11-26 LAB — SURGICAL PATHOLOGY

## 2023-11-29 ENCOUNTER — Ambulatory Visit: Payer: Self-pay | Admitting: Internal Medicine

## 2023-11-29 DIAGNOSIS — Z860101 Personal history of adenomatous and serrated colon polyps: Secondary | ICD-10-CM

## 2023-12-16 ENCOUNTER — Other Ambulatory Visit: Payer: Self-pay | Admitting: Internal Medicine

## 2024-01-09 ENCOUNTER — Other Ambulatory Visit

## 2024-01-09 DIAGNOSIS — C61 Malignant neoplasm of prostate: Secondary | ICD-10-CM

## 2024-01-10 LAB — PSA: Prostate Specific Ag, Serum: 15.2 ng/mL — ABNORMAL HIGH (ref 0.0–4.0)

## 2024-01-11 ENCOUNTER — Ambulatory Visit: Payer: Self-pay

## 2024-01-14 ENCOUNTER — Ambulatory Visit: Admitting: Urology

## 2024-01-14 VITALS — BP 110/60 | HR 73

## 2024-01-14 DIAGNOSIS — C61 Malignant neoplasm of prostate: Secondary | ICD-10-CM

## 2024-01-14 DIAGNOSIS — N138 Other obstructive and reflux uropathy: Secondary | ICD-10-CM

## 2024-01-14 DIAGNOSIS — N529 Male erectile dysfunction, unspecified: Secondary | ICD-10-CM

## 2024-01-14 DIAGNOSIS — R3912 Poor urinary stream: Secondary | ICD-10-CM

## 2024-01-14 MED ORDER — SILDENAFIL CITRATE 20 MG PO TABS: ORAL_TABLET | ORAL | 3 refills | Status: AC

## 2024-01-14 NOTE — Progress Notes (Signed)
 01/14/2024 2:47 PM   Jerilynn LITTIE Breen 1951-01-30 [id number]  Referring provider: Garald Karlynn GAILS, MD [address] [place],  [place] [address]  No chief complaint on file.   HPI:  New patient for me-I reviewed prior notes, path, labs, imaging.  1) prostate cancer-on active surveillance for a PSA 16.6, T1c, GG 1 (10% RBL), 2 ROIs negative on fusion biopsy in 2024. Prostate 77 g.  A August 2024 prostate MRI showed a PI-RADS 3 lesion left peripheral zone mid and apical.  Prostate 64 g. He had a 2024 PSA of 16.6.  He had a prior negative biopsy in 2015 when PSA was 10.12.  Prostate was 60 g at the time. PSA was 12.6 in June 2025.  2) BPH-prostate 64 to 77 g on imaging.  IPSS 24. Did not tolerate finasteride .  Also stopped tamsulosin . On daily tadalafil . IPSS 11.  3) ED-adds sildenafil  as needed.  Today, seen for the above.  He continues tadalafil . December 2025 PSA 15.2. No dysuria or gross hematuria.    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     PMH: Past Medical History:  Diagnosis Date   Anxiety    Arthritis    Back pain, chronic    Cancer (HCC)    Prostate   COPD (chronic obstructive pulmonary disease) (HCC)    Diabetes mellitus without complication (HCC)    Hypertension     Surgical History: Past Surgical History:  Procedure Laterality Date   COLONOSCOPY W/ POLYPECTOMY     LEG SURGERY     NASAL SEPTUM SURGERY      Home Medications:  Allergies as of 01/14/2024       Reactions   Penicillins Itching, Swelling   Sulfonamide Derivatives Itching, Swelling   Varenicline Tartrate Other (See Comments)   states that he had nightmares and felt crazy    Aricept  [donepezil ] Other (See Comments)   Upset stomach   Namenda  [memantine ] Other (See Comments)   lightheaded   Tramadol  Other (See Comments)   dizzy        Medication List        Accurate as of January 14, 2024  2:47 PM. If you have any questions, ask your nurse or doctor.           albuterol  108 (90 Base) MCG/ACT inhaler Commonly known as: ProAir  HFA Inhale 2 puffs into the lungs every 4 (four) hours as needed for wheezing or shortness of breath.   ALPRAZolam  0.5 MG tablet Commonly known as: XANAX  TAKE 1 TABLET BY MOUTH TWICE  DAILY AS NEEDED FOR ANXIETY   ascorbic acid 500 MG tablet Commonly known as: VITAMIN C Take 500 mg by mouth daily.   B-complex with vitamin C tablet Take 1 tablet by mouth daily.   cyclobenzaprine  5 MG tablet Commonly known as: FLEXERIL  TAKE 1 TABLET BY MOUTH TWICE  DAILY AS NEEDED FOR MUSCLE  SPASM(S)   famotidine  40 MG tablet Commonly known as: Pepcid  Take 1 tablet (40 mg total) by mouth daily.   fexofenadine -pseudoephedrine  180-240 MG 24 hr tablet Commonly known as: Allegra-D Allergy & Congestion Take 1 tablet by mouth daily as needed.   hyoscyamine  0.125 MG tablet Commonly known as: LEVSIN  Take 1-2 tablets (0.125-0.25 mg total) by mouth every 4 (four) hours as needed.   Incruse Ellipta  62.5 MCG/INH Aepb Generic drug: umeclidinium bromide  Inhale 1 puff into the lungs daily.   Incruse Ellipta  62.5 MCG/ACT Aepb Generic drug: umeclidinium bromide  USE 1  INHALATION BY MOUTH DAILY   losartan  25 MG tablet Commonly known as: COZAAR  Take 1 tablet (25 mg total) by mouth daily.   magnesium gluconate 500 MG tablet Commonly known as: MAGONATE Take 250 mg by mouth daily. Uses typically once weekly - helps with his sleep   nortriptyline  10 MG capsule Commonly known as: PAMELOR  TAKE 1 TO 2 CAPSULES BY MOUTH  ONCE DAILY AT BEDTIME   QC TUMERIC COMPLEX PO Take by mouth.   tadalafil  5 MG tablet Commonly known as: Cialis  Take 1 tablet (5 mg total) by mouth daily.   tadalafil  20 MG tablet Commonly known as: CIALIS  Take 20 mg by mouth daily as needed.   Vitamin D  50 MCG (2000 UT) Caps 1 po qd        Allergies: Allergies[1]  Family History: Family History  Problem Relation Age of Onset   Cancer Mother 10        lung ca   Cancer Father 51       glioblastoma   Colon cancer Neg Hx    Rectal cancer Neg Hx    Stomach cancer Neg Hx    Esophageal cancer Neg Hx     Social History:  reports that he quit smoking about 3 years ago. His smoking use included cigarettes. He started smoking about 22 years ago. He has a 19 pack-year smoking history. He has quit using smokeless tobacco. He reports current alcohol use. He reports current drug use. Drug: Marijuana.   Physical Exam: There were no vitals taken for this visit.  Constitutional:  Alert and oriented, No acute distress. HEENT: [place] AT, moist mucus membranes.  Trachea midline, no masses. Cardiovascular: No clubbing, cyanosis, or edema. Respiratory: Normal respiratory effort, no increased work of breathing. GI: Abdomen is soft, nontender, nondistended, no abdominal masses GU: No CVA tenderness Skin: No rashes, bruises or suspicious lesions. Neurologic: Grossly intact, no focal deficits, moving all 4 extremities. Psychiatric: Normal mood and affect.  Laboratory Data: Lab Results  Component Value Date   WBC 6.0 03/30/2023   HGB 15.1 03/30/2023   HCT 45.8 03/30/2023   MCV 91.2 03/30/2023   PLT 238.0 03/30/2023    Lab Results  Component Value Date   CREATININE 1.09 03/30/2023    Lab Results  Component Value Date   PSA 16.56 (H) 06/06/2022   PSA 13.19 (H) 12/06/2021   PSA 11.92 (H) 02/21/2021    Lab Results  Component Value Date   TESTOSTERONE  364.05 12/06/2021    Lab Results  Component Value Date   HGBA1C 5.7 03/10/2016    Urinalysis    Component Value Date/Time   COLORURINE YELLOW 12/06/2021 1355   APPEARANCEUR Clear 01/11/2023 1448   LABSPEC 1.020 12/06/2021 1355   PHURINE 6.0 12/06/2021 1355   GLUCOSEU Negative 01/11/2023 1448   GLUCOSEU NEGATIVE 12/06/2021 1355   HGBUR NEGATIVE 12/06/2021 1355   BILIRUBINUR Negative 01/11/2023 1448   KETONESUR NEGATIVE 12/06/2021 1355   PROTEINUR Negative 01/11/2023 1448    UROBILINOGEN 0.2 12/06/2021 1355   NITRITE Negative 01/11/2023 1448   NITRITE NEGATIVE 12/06/2021 1355   LEUKOCYTESUR Negative 01/11/2023 1448   LEUKOCYTESUR NEGATIVE 12/06/2021 1355    Lab Results  Component Value Date   LABMICR See below: 01/11/2023   WBCUA None seen 01/11/2023   LABEPIT 0-10 01/11/2023   BACTERIA None seen 01/11/2023    Pertinent Imaging: 2024 p MRI iamges .   Assessment & Plan:    BPH , wk stream - cont tamsulosin , tadalafil .  PCa - PSA stable   ED - cont combo therapy.   No follow-ups on file.  Donnice Brooks, MD  [facility]  [address] [place], [place] [address] [phone]      [1]  Allergies Allergen Reactions   Penicillins Itching and Swelling   Sulfonamide Derivatives Itching and Swelling   Varenicline Tartrate Other (See Comments)    states that he had nightmares and felt crazy    Aricept  [Donepezil ] Other (See Comments)    Upset stomach   Namenda  [Memantine ] Other (See Comments)    lightheaded   Tramadol  Other (See Comments)    dizzy

## 2024-01-15 LAB — URINALYSIS, ROUTINE W REFLEX MICROSCOPIC
Bilirubin, UA: NEGATIVE
Glucose, UA: NEGATIVE
Ketones, UA: NEGATIVE
Leukocytes,UA: NEGATIVE
Nitrite, UA: NEGATIVE
Protein,UA: NEGATIVE
RBC, UA: NEGATIVE
Specific Gravity, UA: <1.005 — ABNORMAL LOW (ref 1.005–1.030)
Urobilinogen, Ur: 0.2 mg/dL (ref 0.2–1.0)
pH, UA: 6.5 (ref 5.0–7.5)

## 2024-03-06 LAB — OPHTHALMOLOGY REPORT-SCANNED

## 2024-07-07 ENCOUNTER — Other Ambulatory Visit

## 2024-07-14 ENCOUNTER — Ambulatory Visit: Admitting: Urology

## 2024-07-17 ENCOUNTER — Ambulatory Visit
